# Patient Record
Sex: Female | Born: 1965
Health system: Southern US, Community
[De-identification: ages and names within clinical notes are randomized; demographics above are authoritative.]

## PROBLEM LIST (undated history)

## (undated) DIAGNOSIS — I10 Essential (primary) hypertension: Secondary | ICD-10-CM

## (undated) DIAGNOSIS — E538 Deficiency of other specified B group vitamins: Secondary | ICD-10-CM

## (undated) DIAGNOSIS — M545 Low back pain, unspecified: Secondary | ICD-10-CM

## (undated) DIAGNOSIS — D219 Benign neoplasm of connective and other soft tissue, unspecified: Secondary | ICD-10-CM

## (undated) DIAGNOSIS — T7840XA Allergy, unspecified, initial encounter: Secondary | ICD-10-CM

## (undated) DIAGNOSIS — M1990 Unspecified osteoarthritis, unspecified site: Secondary | ICD-10-CM

## (undated) DIAGNOSIS — G5600 Carpal tunnel syndrome, unspecified upper limb: Secondary | ICD-10-CM

## (undated) DIAGNOSIS — M2550 Pain in unspecified joint: Secondary | ICD-10-CM

## (undated) DIAGNOSIS — Z86010 Personal history of colonic polyps: Principal | ICD-10-CM

## (undated) HISTORY — DX: Unspecified osteoarthritis, unspecified site: M19.90

## (undated) HISTORY — PX: BACK SURGERY: SHX140

## (undated) HISTORY — DX: Essential (primary) hypertension: I10

## (undated) HISTORY — PX: COLONOSCOPY: SHX174

## (undated) HISTORY — DX: Benign neoplasm of connective and other soft tissue, unspecified: D21.9

## (undated) HISTORY — DX: Low back pain: M54.5

## (undated) HISTORY — DX: Deficiency of other specified B group vitamins: E53.8

## (undated) HISTORY — DX: Allergy, unspecified, initial encounter: T78.40XA

## (undated) HISTORY — DX: Carpal tunnel syndrome, unspecified upper limb: G56.00

## (undated) HISTORY — PX: POLYPECTOMY: SHX149

## (undated) HISTORY — PX: OTHER SURGICAL HISTORY: SHX169

## (undated) HISTORY — DX: Low back pain, unspecified: M54.50

## (undated) HISTORY — DX: Pain in unspecified joint: M25.50

## (undated) HISTORY — DX: Personal history of colonic polyps: Z86.010

---

## 1998-12-30 ENCOUNTER — Other Ambulatory Visit: Admission: RE | Admit: 1998-12-30 | Discharge: 1998-12-30 | Payer: Self-pay | Admitting: Obstetrics and Gynecology

## 1999-09-17 ENCOUNTER — Emergency Department (HOSPITAL_COMMUNITY): Admission: EM | Admit: 1999-09-17 | Discharge: 1999-09-17 | Payer: Self-pay | Admitting: Emergency Medicine

## 1999-10-08 ENCOUNTER — Other Ambulatory Visit: Admission: RE | Admit: 1999-10-08 | Discharge: 1999-10-08 | Payer: Self-pay | Admitting: Internal Medicine

## 2001-09-04 ENCOUNTER — Emergency Department (HOSPITAL_COMMUNITY): Admission: EM | Admit: 2001-09-04 | Discharge: 2001-09-04 | Payer: Self-pay | Admitting: Emergency Medicine

## 2001-09-13 ENCOUNTER — Emergency Department (HOSPITAL_COMMUNITY): Admission: EM | Admit: 2001-09-13 | Discharge: 2001-09-13 | Payer: Self-pay

## 2002-01-11 ENCOUNTER — Other Ambulatory Visit: Admission: RE | Admit: 2002-01-11 | Discharge: 2002-01-11 | Payer: Self-pay | Admitting: Internal Medicine

## 2002-09-09 ENCOUNTER — Emergency Department (HOSPITAL_COMMUNITY): Admission: EM | Admit: 2002-09-09 | Discharge: 2002-09-09 | Payer: Self-pay | Admitting: Emergency Medicine

## 2003-03-26 ENCOUNTER — Other Ambulatory Visit: Admission: RE | Admit: 2003-03-26 | Discharge: 2003-03-26 | Payer: Self-pay | Admitting: Internal Medicine

## 2004-03-08 ENCOUNTER — Ambulatory Visit: Payer: Self-pay | Admitting: Family Medicine

## 2004-03-18 ENCOUNTER — Ambulatory Visit: Payer: Self-pay | Admitting: Family Medicine

## 2004-03-18 ENCOUNTER — Ambulatory Visit: Payer: Self-pay | Admitting: *Deleted

## 2004-04-08 ENCOUNTER — Ambulatory Visit: Payer: Self-pay | Admitting: Family Medicine

## 2004-05-06 ENCOUNTER — Ambulatory Visit: Payer: Self-pay | Admitting: Family Medicine

## 2004-05-13 ENCOUNTER — Ambulatory Visit: Payer: Self-pay | Admitting: Family Medicine

## 2004-05-20 ENCOUNTER — Ambulatory Visit: Payer: Self-pay | Admitting: Family Medicine

## 2004-07-05 ENCOUNTER — Ambulatory Visit: Payer: Self-pay | Admitting: Internal Medicine

## 2004-07-08 ENCOUNTER — Ambulatory Visit: Payer: Self-pay | Admitting: Family Medicine

## 2004-09-30 ENCOUNTER — Ambulatory Visit: Payer: Self-pay | Admitting: Family Medicine

## 2005-04-11 ENCOUNTER — Ambulatory Visit: Payer: Self-pay | Admitting: Hospitalist

## 2005-04-19 ENCOUNTER — Ambulatory Visit: Payer: Self-pay | Admitting: Hospitalist

## 2005-09-05 ENCOUNTER — Ambulatory Visit: Payer: Self-pay | Admitting: Internal Medicine

## 2005-09-13 ENCOUNTER — Encounter: Payer: Self-pay | Admitting: Internal Medicine

## 2005-09-13 ENCOUNTER — Ambulatory Visit: Payer: Self-pay | Admitting: Internal Medicine

## 2005-09-13 ENCOUNTER — Other Ambulatory Visit: Admission: RE | Admit: 2005-09-13 | Discharge: 2005-09-13 | Payer: Self-pay | Admitting: Internal Medicine

## 2005-10-11 ENCOUNTER — Ambulatory Visit: Payer: Self-pay | Admitting: Internal Medicine

## 2006-01-16 ENCOUNTER — Ambulatory Visit: Payer: Self-pay | Admitting: Internal Medicine

## 2006-03-03 ENCOUNTER — Ambulatory Visit: Payer: Self-pay | Admitting: Internal Medicine

## 2006-03-07 ENCOUNTER — Ambulatory Visit: Payer: Self-pay | Admitting: Internal Medicine

## 2006-05-29 ENCOUNTER — Telehealth: Payer: Self-pay | Admitting: *Deleted

## 2006-07-03 ENCOUNTER — Ambulatory Visit: Payer: Self-pay | Admitting: Internal Medicine

## 2006-07-03 LAB — CONVERTED CEMR LAB
CO2: 32 meq/L (ref 19–32)
Chloride: 103 meq/L (ref 96–112)
GFR calc Af Amer: 176 mL/min
Glucose, Bld: 124 mg/dL — ABNORMAL HIGH (ref 70–99)
HDL: 45.2 mg/dL (ref >39.0)
Hgb A1c MFr Bld: 7.3 % — ABNORMAL HIGH (ref 4.6–6.0)
Potassium: 4.7 meq/L (ref 3.5–5.1)
Sodium: 142 meq/L (ref 135–145)
Total Bilirubin: 0.4 mg/dL (ref 0.3–1.2)
Triglycerides: 170 mg/dL — ABNORMAL HIGH (ref 0–149)
VLDL: 34 mg/dL (ref 0–40)

## 2006-07-04 ENCOUNTER — Other Ambulatory Visit: Admission: RE | Admit: 2006-07-04 | Discharge: 2006-07-04 | Payer: Self-pay | Admitting: Internal Medicine

## 2006-07-04 ENCOUNTER — Encounter: Payer: Self-pay | Admitting: Internal Medicine

## 2006-07-04 ENCOUNTER — Ambulatory Visit: Payer: Self-pay | Admitting: Internal Medicine

## 2006-07-04 LAB — CONVERTED CEMR LAB

## 2006-11-06 ENCOUNTER — Ambulatory Visit: Payer: Self-pay | Admitting: Internal Medicine

## 2006-11-06 LAB — CONVERTED CEMR LAB
BUN: 10 mg/dL (ref 6–23)
Chloride: 101 meq/L (ref 96–112)
Creatinine, Ser: 0.7 mg/dL (ref 0.4–1.2)
Glucose, Bld: 213 mg/dL — ABNORMAL HIGH (ref 70–99)
Hgb A1c MFr Bld: 7 % — ABNORMAL HIGH (ref 4.6–6.0)
Sodium: 140 meq/L (ref 135–145)
Vit D, 1,25-Dihydroxy: 49 (ref 20–57)

## 2007-03-08 ENCOUNTER — Ambulatory Visit: Payer: Self-pay | Admitting: Internal Medicine

## 2007-03-08 DIAGNOSIS — M545 Low back pain, unspecified: Secondary | ICD-10-CM | POA: Insufficient documentation

## 2007-03-08 DIAGNOSIS — M1990 Unspecified osteoarthritis, unspecified site: Secondary | ICD-10-CM | POA: Insufficient documentation

## 2007-03-11 DIAGNOSIS — J309 Allergic rhinitis, unspecified: Secondary | ICD-10-CM | POA: Insufficient documentation

## 2007-03-11 DIAGNOSIS — J45909 Unspecified asthma, uncomplicated: Secondary | ICD-10-CM | POA: Insufficient documentation

## 2007-03-11 DIAGNOSIS — I10 Essential (primary) hypertension: Secondary | ICD-10-CM | POA: Insufficient documentation

## 2007-07-06 ENCOUNTER — Ambulatory Visit: Payer: Self-pay | Admitting: Internal Medicine

## 2007-07-06 DIAGNOSIS — E876 Hypokalemia: Secondary | ICD-10-CM | POA: Insufficient documentation

## 2007-07-09 LAB — CONVERTED CEMR LAB
CO2: 28 meq/L (ref 19–32)
Calcium: 8.9 mg/dL (ref 8.4–10.5)
Chloride: 100 meq/L (ref 96–112)
Creatinine, Ser: 0.7 mg/dL (ref 0.4–1.2)
GFR calc Af Amer: 119 mL/min
HCT: 37.1 % (ref 36.0–46.0)
Ketones, ur: NEGATIVE mg/dL
Neutrophils Relative %: 50.2 % (ref 43.0–77.0)
Nitrite: NEGATIVE
Platelets: 350 10*3/uL (ref 150–400)
Total Protein, Urine: NEGATIVE mg/dL
Urine Glucose: NEGATIVE mg/dL
pH: 5.5 (ref 5.0–8.0)

## 2007-10-05 ENCOUNTER — Ambulatory Visit: Payer: Self-pay | Admitting: Internal Medicine

## 2007-10-05 DIAGNOSIS — J209 Acute bronchitis, unspecified: Secondary | ICD-10-CM | POA: Insufficient documentation

## 2007-11-12 ENCOUNTER — Ambulatory Visit: Payer: Self-pay | Admitting: Internal Medicine

## 2007-11-12 DIAGNOSIS — R10812 Left upper quadrant abdominal tenderness: Secondary | ICD-10-CM | POA: Insufficient documentation

## 2007-11-13 LAB — CONVERTED CEMR LAB
ALT: 16 units/L (ref 0–35)
AST: 22 units/L (ref 0–37)
Alkaline Phosphatase: 69 units/L (ref 39–117)
BUN: 8 mg/dL (ref 6–23)
Bilirubin, Direct: 0.1 mg/dL (ref 0.0–0.3)
CO2: 31 meq/L (ref 19–32)
Chloride: 107 meq/L (ref 96–112)
Cholesterol: 217 mg/dL (ref 0–200)
Direct LDL: 127 mg/dL
GFR calc non Af Amer: 98 mL/min
Potassium: 4 meq/L (ref 3.5–5.1)
Sodium: 142 meq/L (ref 135–145)
Total Bilirubin: 0.5 mg/dL (ref 0.3–1.2)
Total CHOL/HDL Ratio: 4.5
Triglycerides: 96 mg/dL (ref 0–149)
Vitamin B-12: 1152 pg/mL — ABNORMAL HIGH (ref 211–911)

## 2007-12-14 ENCOUNTER — Ambulatory Visit: Payer: Self-pay | Admitting: Internal Medicine

## 2008-03-03 ENCOUNTER — Ambulatory Visit: Payer: Self-pay | Admitting: Internal Medicine

## 2008-03-05 LAB — CONVERTED CEMR LAB
AST: 20 units/L (ref 0–37)
Basophils Relative: 0.8 % (ref 0.0–3.0)
Bilirubin Urine: NEGATIVE
Bilirubin, Direct: 0.1 mg/dL (ref 0.0–0.3)
CO2: 29 meq/L (ref 19–32)
Calcium: 9.5 mg/dL (ref 8.4–10.5)
Chloride: 99 meq/L (ref 96–112)
Cholesterol: 209 mg/dL (ref 0–200)
Creatinine, Ser: 0.7 mg/dL (ref 0.4–1.2)
GFR calc non Af Amer: 98 mL/min
Glucose, Bld: 127 mg/dL — ABNORMAL HIGH (ref 70–99)
HCT: 39.4 % (ref 36.0–46.0)
Hemoglobin: 13.5 g/dL (ref 12.0–15.0)
Leukocytes, UA: NEGATIVE
MCHC: 34.2 g/dL (ref 30.0–36.0)
MCV: 96.9 fL (ref 78.0–100.0)
Monocytes Relative: 8.8 % (ref 3.0–12.0)
Neutro Abs: 3.7 10*3/uL (ref 1.4–7.7)
Nitrite: NEGATIVE
Platelets: 289 10*3/uL (ref 150–400)
RBC: 4.07 M/uL (ref 3.87–5.11)
RDW: 12.1 % (ref 11.5–14.6)
Sodium: 138 meq/L (ref 135–145)
Total Bilirubin: 0.5 mg/dL (ref 0.3–1.2)
Total Protein: 7.7 g/dL (ref 6.0–8.3)
Triglycerides: 179 mg/dL — ABNORMAL HIGH (ref 0–149)
VLDL: 36 mg/dL (ref 0–40)
Vitamin B-12: 1139 pg/mL — ABNORMAL HIGH (ref 211–911)
WBC: 8.3 10*3/uL (ref 4.5–10.5)

## 2008-03-18 ENCOUNTER — Ambulatory Visit: Payer: Self-pay | Admitting: Internal Medicine

## 2008-03-18 DIAGNOSIS — J069 Acute upper respiratory infection, unspecified: Secondary | ICD-10-CM | POA: Insufficient documentation

## 2008-03-24 ENCOUNTER — Encounter: Payer: Self-pay | Admitting: Internal Medicine

## 2008-06-30 ENCOUNTER — Ambulatory Visit: Payer: Self-pay | Admitting: Internal Medicine

## 2008-07-01 ENCOUNTER — Encounter: Payer: Self-pay | Admitting: Internal Medicine

## 2008-07-01 LAB — CONVERTED CEMR LAB
ALT: 21 units/L (ref 0–35)
AST: 21 units/L (ref 0–37)
Alkaline Phosphatase: 63 units/L (ref 39–117)
BUN: 10 mg/dL (ref 6–23)
Chloride: 106 meq/L (ref 96–112)
Creatinine, Ser: 0.6 mg/dL (ref 0.4–1.2)
GFR calc non Af Amer: 140.82 mL/min (ref >60)
Glucose, Bld: 167 mg/dL — ABNORMAL HIGH (ref 70–99)
Potassium: 4.4 meq/L (ref 3.5–5.1)
TSH: 3.94 microintl units/mL (ref 0.35–5.50)
Total Protein: 6.8 g/dL (ref 6.0–8.3)

## 2008-09-25 ENCOUNTER — Encounter: Payer: Self-pay | Admitting: Internal Medicine

## 2008-09-25 ENCOUNTER — Ambulatory Visit: Payer: Self-pay | Admitting: Internal Medicine

## 2008-09-25 DIAGNOSIS — M5416 Radiculopathy, lumbar region: Secondary | ICD-10-CM | POA: Insufficient documentation

## 2008-09-25 DIAGNOSIS — IMO0002 Reserved for concepts with insufficient information to code with codable children: Secondary | ICD-10-CM | POA: Insufficient documentation

## 2008-10-03 ENCOUNTER — Ambulatory Visit: Payer: Self-pay | Admitting: Internal Medicine

## 2008-10-15 ENCOUNTER — Telehealth: Payer: Self-pay | Admitting: Internal Medicine

## 2008-10-28 ENCOUNTER — Ambulatory Visit: Payer: Self-pay | Admitting: Internal Medicine

## 2008-11-19 ENCOUNTER — Ambulatory Visit: Payer: Self-pay | Admitting: Internal Medicine

## 2008-11-19 LAB — CONVERTED CEMR LAB: Blood Glucose, Fingerstick: 152

## 2008-12-22 ENCOUNTER — Ambulatory Visit: Payer: Self-pay | Admitting: Internal Medicine

## 2009-01-01 ENCOUNTER — Encounter: Admission: RE | Admit: 2009-01-01 | Discharge: 2009-01-01 | Payer: Self-pay | Admitting: Internal Medicine

## 2009-01-23 ENCOUNTER — Ambulatory Visit: Payer: Self-pay | Admitting: Internal Medicine

## 2009-01-29 ENCOUNTER — Encounter: Payer: Self-pay | Admitting: Internal Medicine

## 2009-04-03 ENCOUNTER — Ambulatory Visit: Payer: Self-pay | Admitting: Internal Medicine

## 2009-04-03 LAB — CONVERTED CEMR LAB
AST: 17 units/L (ref 0–37)
Albumin: 4.5 g/dL (ref 3.5–5.2)
Alkaline Phosphatase: 65 units/L (ref 39–117)
BUN: 11 mg/dL (ref 6–23)
Calcium: 9.5 mg/dL (ref 8.4–10.5)
Chloride: 99 meq/L (ref 96–112)
Creatinine, Ser: 0.6 mg/dL (ref 0.40–1.20)
Total Bilirubin: 0.3 mg/dL (ref 0.3–1.2)
Total Protein: 7.9 g/dL (ref 6.0–8.3)

## 2009-04-06 LAB — CONVERTED CEMR LAB: Hgb A1c MFr Bld: 6.9 % — ABNORMAL HIGH (ref 4.6–6.5)

## 2009-05-28 ENCOUNTER — Ambulatory Visit: Payer: Self-pay | Admitting: Internal Medicine

## 2009-05-28 DIAGNOSIS — R079 Chest pain, unspecified: Secondary | ICD-10-CM | POA: Insufficient documentation

## 2009-08-06 ENCOUNTER — Ambulatory Visit: Payer: Self-pay | Admitting: Internal Medicine

## 2009-08-06 LAB — CONVERTED CEMR LAB
Calcium: 9.5 mg/dL (ref 8.4–10.5)
Chloride: 101 meq/L (ref 96–112)
Cholesterol: 199 mg/dL (ref 0–200)
HDL: 52.7 mg/dL (ref >39.00)
Triglycerides: 93 mg/dL (ref 0.0–149.0)
VLDL: 18.6 mg/dL (ref 0.0–40.0)
Vitamin B-12: 1104 pg/mL — ABNORMAL HIGH (ref 211–911)

## 2009-08-07 ENCOUNTER — Ambulatory Visit: Payer: Self-pay | Admitting: Internal Medicine

## 2009-12-07 ENCOUNTER — Ambulatory Visit: Payer: Self-pay | Admitting: Internal Medicine

## 2009-12-09 LAB — CONVERTED CEMR LAB
AST: 22 units/L (ref 0–37)
Alkaline Phosphatase: 62 units/L (ref 39–117)
Calcium: 9.5 mg/dL (ref 8.4–10.5)
Creatinine, Ser: 0.7 mg/dL (ref 0.4–1.2)
Hgb A1c MFr Bld: 7.9 % — ABNORMAL HIGH (ref 4.6–6.5)
Microalb Creat Ratio: 0.9 mg/g (ref 0.0–30.0)
Potassium: 4.4 meq/L (ref 3.5–5.1)
Sodium: 139 meq/L (ref 135–145)
Vitamin B-12: >1500 pg/mL — ABNORMAL HIGH (ref 211–911)

## 2009-12-10 ENCOUNTER — Telehealth: Payer: Self-pay | Admitting: Internal Medicine

## 2010-01-15 ENCOUNTER — Emergency Department (HOSPITAL_COMMUNITY): Admission: EM | Admit: 2010-01-15 | Discharge: 2010-01-15 | Payer: Self-pay | Admitting: Emergency Medicine

## 2010-03-01 ENCOUNTER — Ambulatory Visit: Payer: Self-pay | Admitting: Internal Medicine

## 2010-03-08 ENCOUNTER — Ambulatory Visit: Payer: Self-pay | Admitting: Internal Medicine

## 2010-03-10 LAB — CONVERTED CEMR LAB
BUN: 10 mg/dL (ref 6–23)
Calcium: 10.2 mg/dL (ref 8.4–10.5)
Chloride: 100 meq/L (ref 96–112)
Creatinine, Ser: 0.5 mg/dL (ref 0.4–1.2)
GFR calc non Af Amer: 164.79 mL/min (ref >60.00)
Hgb A1c MFr Bld: 7.8 % — ABNORMAL HIGH (ref 4.6–6.5)
Vitamin B-12: 1170 pg/mL — ABNORMAL HIGH (ref 211–911)

## 2010-04-03 DIAGNOSIS — H409 Unspecified glaucoma: Secondary | ICD-10-CM | POA: Insufficient documentation

## 2010-04-03 DIAGNOSIS — G5600 Carpal tunnel syndrome, unspecified upper limb: Secondary | ICD-10-CM | POA: Insufficient documentation

## 2010-04-03 DIAGNOSIS — M2550 Pain in unspecified joint: Secondary | ICD-10-CM | POA: Insufficient documentation

## 2010-04-03 DIAGNOSIS — E538 Deficiency of other specified B group vitamins: Secondary | ICD-10-CM | POA: Insufficient documentation

## 2010-04-12 ENCOUNTER — Encounter: Payer: Self-pay | Admitting: Internal Medicine

## 2010-04-20 NOTE — Assessment & Plan Note (Signed)
Summary: 4 MTH FU  STC   Vital Signs:  Patient profile:   45 year old female Height:      69 inches Weight:      174.50 pounds BMI:     25.86 O2 Sat:      99 % on Room air Temp:     97.5 degrees F oral Pulse rate:   85 / minute BP sitting:   130 / 80  (left arm) Cuff size:   regular  Vitals Entered By: Lucious Groves (Aug 07, 2009 8:09 AM)  O2 Flow:  Room air CC: 4 mo f/u./kb Is Patient Diabetic? Yes Pain Assessment Patient in pain? no      Comments Patient notes that she is not taking Lamisil anymore./kb   Primary Care Provider:  Tresa Garter MD  CC:  4 mo f/u./kb.  History of Present Illness: The patient presents for a follow up of hypertension, diabetes, hyperlipidemia   Current Medications (verified): 1)  Glimepiride 1 Mg  Tabs (Glimepiride) .... 1/2 Once Daily 2)  Lamisil At Athletes Foot 1 %  Crea (Terbinafine Hcl) .... As Needed 3)  Vitamin D3 1000 Unit  Tabs (Cholecalciferol) .Marland Kitchen.. 1 By Mouth Daily 4)  Cvs Vitamin B12 1000 Mcg  Tabs (Cyanocobalamin) .... Once Daily 5)  Loratadine 10 Mg  Tabs (Loratadine) .... Once Daily As Needed Allergies 6)  Hydrochlorothiazide 12.5 Mg  Tabs (Hydrochlorothiazide) .... Take 1 Tab  By Mouth Every Morning 7)  Advair Diskus 250-50 Mcg/dose Misc (Fluticasone-Salmeterol) .Marland Kitchen.. 1 Puff 2 Times Daily 8)  Aviane 0.1-20 Mg-Mcg Tabs (Levonorgestrel-Ethinyl Estrad) .... As Dirr. 9)  Ventolin Hfa 108 (90 Base) Mcg/act Aers (Albuterol Sulfate) .... As Directed As Needed 10)  Fluticasone Propionate 50 Mcg/act  Susp (Fluticasone Propionate) .... 2 Sprays Each Nostril Once Daily 11)  Cyclobenzaprine Hcl 10 Mg  Tabs (Cyclobenzaprine Hcl) .Marland Kitchen.. 1 By Mouth 2 Times Daily As Needed For Back Pain 12)  Ibuprofen 600 Mg  Tabs (Ibuprofen) .Marland Kitchen.. 1 By Mouth Two Times A Day X10 D, Then As Needed 13)  Ranitidine Hcl 300 Mg Tabs (Ranitidine Hcl) .Marland Kitchen.. 1 By Mouth Once Daily For Indigestion 14)  Metformin Hcl 1000 Mg Tabs (Metformin Hcl) .... Take 1 Two  Times A Day  Allergies (verified): 1)  ! Ultram (Tramadol Hcl) 2)  Lotensin 3)  * Cleocin  Vag  Past History:  Past Medical History: Last updated: 07/06/2007 Diabetes mellitus, type II Low back pain Osteoarthritis CTS ? glaucoma Polyarthralgia   Dr Kellie Simmering Asthma Hypertension Vit B12 def Allergic rhinitis  Social History: Last updated: 03/08/2007 Occupation: plant worker Single Never Smoked  Review of Systems  The patient denies fever, weight loss, weight gain, prolonged cough, and abdominal pain.    Physical Exam  General:  alert and overweight-appearing (mild) Nose:  Erythematous throat mucosa and intranasal erythema.  Mouth:  no gingival abnormalities and pharynx pink and moist.   Lungs:  CTA Heart:  RRR Abdomen:  soft and non-tender.   Msk:  Lumbar-sacral spine is tender to palpation over paraspinal muscles and painfull with the ROM  Extremities:  no edema, no ulcers  Neurologic:  alert , cranial nerves II-XII intact, strength normal in all extremities Skin:  Intact without suspicious lesions or rashes Psych:  Cognition and judgment appear intact. Alert and cooperative with normal attention span and concentration. No apparent delusions, illusions, hallucinations   Impression & Recommendations:  Problem # 1:  DIABETES MELLITUS, TYPE II (ICD-250.00) Assessment Improved  Her updated  medication list for this problem includes:    Glimepiride 1 Mg Tabs (Glimepiride) .Marland Kitchen... 1/2 once daily    Metformin Hcl 1000 Mg Tabs (Metformin hcl) .Marland Kitchen... Take 1 two times a day  Problem # 2:  B12 DEFICIENCY (ICD-266.2) Assessment: Improved  Problem # 3:  LUMBAR RADICULOPATHY, RIGHT (ICD-724.4) Assessment: Improved  Her updated medication list for this problem includes:    Cyclobenzaprine Hcl 10 Mg Tabs (Cyclobenzaprine hcl) .Marland Kitchen... 1 by mouth 2 times daily as needed for back pain    Ibuprofen 600 Mg Tabs (Ibuprofen) .Marland Kitchen... 1 by mouth two times a day x10 d, then as  needed  Problem # 4:  ASTHMA (ICD-493.90) Assessment: Unchanged  Her updated medication list for this problem includes:    Advair Diskus 250-50 Mcg/dose Misc (Fluticasone-salmeterol) .Marland Kitchen... 1 puff 2 times daily    Ventolin Hfa 108 (90 Base) Mcg/act Aers (Albuterol sulfate) .Marland Kitchen... As directed as needed  Problem # 5:  ALLERGIC RHINITIS (ICD-477.9)  Her updated medication list for this problem includes:    Loratadine 10 Mg Tabs (Loratadine) ..... Once daily as needed allergies    Fluticasone Propionate 50 Mcg/act Susp (Fluticasone propionate) .Marland Kitchen... 2 sprays each nostril once daily  Complete Medication List: 1)  Glimepiride 1 Mg Tabs (Glimepiride) .... 1/2 once daily 2)  Lamisil At Athletes Foot 1 % Crea (Terbinafine hcl) .... As needed 3)  Vitamin D3 1000 Unit Tabs (Cholecalciferol) .Marland Kitchen.. 1 by mouth daily 4)  Cvs Vitamin B12 1000 Mcg Tabs (Cyanocobalamin) .... Once daily 5)  Loratadine 10 Mg Tabs (Loratadine) .... Once daily as needed allergies 6)  Hydrochlorothiazide 12.5 Mg Tabs (Hydrochlorothiazide) .... Take 1 tab  by mouth every morning 7)  Advair Diskus 250-50 Mcg/dose Misc (Fluticasone-salmeterol) .Marland Kitchen.. 1 puff 2 times daily 8)  Aviane 0.1-20 Mg-mcg Tabs (Levonorgestrel-ethinyl estrad) .... As dirr. 9)  Ventolin Hfa 108 (90 Base) Mcg/act Aers (Albuterol sulfate) .... As directed as needed 10)  Fluticasone Propionate 50 Mcg/act Susp (Fluticasone propionate) .... 2 sprays each nostril once daily 11)  Cyclobenzaprine Hcl 10 Mg Tabs (Cyclobenzaprine hcl) .Marland Kitchen.. 1 by mouth 2 times daily as needed for back pain 12)  Ibuprofen 600 Mg Tabs (Ibuprofen) .Marland Kitchen.. 1 by mouth two times a day x10 d, then as needed 13)  Ranitidine Hcl 300 Mg Tabs (Ranitidine hcl) .Marland Kitchen.. 1 by mouth once daily for indigestion 14)  Metformin Hcl 1000 Mg Tabs (Metformin hcl) .... Take 1 two times a day  Patient Instructions: 1)  Please schedule a follow-up appointment in 4 months. 2)  BMP prior to visit, ICD-9: 3)  HbgA1C  prior to visit, ICD-9:250.00 Prescriptions: METFORMIN HCL 1000 MG TABS (METFORMIN HCL) take 1 two times a day  #60 x 3   Entered and Authorized by:   Tresa Garter MD   Signed by:   Tresa Garter MD on 08/07/2009   Method used:   Electronically to        [facility] (retail)       [address]       [place], [place]  [address]       Ph: [phone]       Fax: [phone]   RxID:   [id number] RANITIDINE HCL 300 MG TABS (RANITIDINE HCL) 1 by mouth once daily for indigestion  #30 x 3   Entered and Authorized by:   Tresa Garter MD   Signed by:   Georgina Quint Thirza Pellicano  MD on 08/07/2009   Method used:   [facility]ail[facility]       [address]       [place], [place]  [address]       Ph: [phone]       Fax: [phone]   RxID:   [id number] IBUPROFEN 600 MG  TABS (IBUPROFEN) 1 by mouth two times a day x10 d, then as needed  #60 x 3   Entered and Authorized by:   Tresa Garter MD   Signed by:   Tresa Garter MD on 08/07/2009   Method used:   Electronically to        [facility] (retail)       [address]       [place], [place]  [address]       Ph: [phone]       Fax: [phone]   RxID:   [id number] CYCLOBENZAPRINE HCL 10 MG  TABS (CYCLOBENZAPRINE HCL) 1 by mouth 2 times daily as needed for back pain  #30 x 1   Entered and Authorized by:   Tresa Garter MD   Signed by:   Tresa Garter MD on 08/07/2009   Method used:   Electronically to        [facility] (retail)       [address]       [place], [place]  [address]       Ph: [phone]       Fax: [phone]   RxID:   [id number] VENTOLIN HFA 108 (90 BASE) MCG/ACT AERS (ALBUTEROL SULFATE) as directed as needed  #1 x 12   Entered and Authorized by:   Tresa Garter MD   Signed by:   Tresa Garter MD on  08/07/2009   Method used:   Electronically to        [facility] (retail)       [address]       [place], [place]  [address]       Ph: [phone]       Fax: [phone]   RxID:   [id number] AVIANE 0.1-20 MG-MCG TABS (LEVONORGESTREL-ETHINYL ESTRAD) as dirr.  #28 Each x 11   Entered and Authorized by:   Tresa Garter MD   Signed by:   Tresa Garter MD on 08/07/2009   Method used:   Electronically to        [facility] (retail)       [address]       [place], [place]  [address]       Ph: [phone]       Fax: [phone]   RxID:   [id number] ADVAIR DISKUS 250-50 MCG/DOSE MISC (FLUTICASONE-SALMETEROL) 1 puff 2 times daily  #1 x 12   Entered and Authorized by:   Tresa Garter MD   Signed by:   Tresa Garter MD on 08/07/2009   Method used:   Electronically to        [facility] (retail)       [address]  [place], [place]  [address]       Ph: [phone]       Fax: [phone]   RxID:   [id number] HYDROCHLOROTHIAZIDE 12.5 MG  TABS (HYDROCHLOROTHIAZIDE) Take 1 tab  by mouth every morning  #30 x 11   Entered and Authorized by:   Tresa Garter MD   Signed by:   Tresa Garter MD on 08/07/2009   Method used:   Electronically to        [facility] (retail)       [address]       [place], [place]  [address]       Ph: [phone]       Fax: [phone]   RxID:   [id number] LORATADINE 10 MG  TABS (LORATADINE) once daily as needed allergies  #30 x 12   Entered and Authorized by:   Tresa Garter MD   Signed by:   Tresa Garter MD on 08/07/2009   Method used:   Electronically to        [facility] (retail)       [address]       [place], [place]  [address]       Ph: [phone]       Fax: [phone]   RxID:   [id number] [facility] VITAMIN B12  1000 MCG  TABS (CYANOCOBALAMIN) once daily  #100 x 3   Entered and Authorized by:   Tresa Garter MD   Signed by:   Tresa Garter MD on 08/07/2009   Method used:   Electronically to        [facility] (retail)       [address]       [place], [place]  [address]       Ph: [phone]       Fax: [phone]   RxID:   [id number] VITAMIN D3 1000 UNIT  TABS (CHOLECALCIFEROL) 1 by mouth daily  #100 x 3   Entered and Authorized by:   Tresa Garter MD   Signed by:   Tresa Garter MD on 08/07/2009   Method used:   Electronically to        [facility] (retail)       [address]       [place], [place]  [address]       Ph: [phone]       Fax: [phone]   RxID:   [id number] GLIMEPIRIDE 1 MG  TABS (GLIMEPIRIDE) 1/2 once daily  #30 Each x 11   Entered and Authorized by:   Tresa Garter MD   Signed by:   Tresa Garter MD on 08/07/2009   Method used:   Electronically to        [facility] (retail)       [address]       [place], [place]  [address]       Ph: [phone]       Fax: [phone]   RxID:   [id number]

## 2010-04-20 NOTE — Assessment & Plan Note (Signed)
Summary: asthma flare up/cd   Vital Signs:  Patient profile:   45 year old female Weight:      175 pounds Temp:     96.9 degrees F oral Pulse rate:   94 / minute BP sitting:   120 / 84  (left arm)  Vitals Entered By: Tora Perches (May 28, 2009 8:59 AM) Is Patient Diabetic? Yes   Primary Care Provider:  Georgina Quint Plotnikov MD   History of Present Illness: C/o CP in the middle - constant; dull and constant; has some L arm and back discomfort.  Preventive Screening-Counseling & Management  Alcohol-Tobacco     Smoking Status: never  Current Medications (verified): 1)  Metformin Hcl 500 Mg  Tabs (Metformin Hcl) .... 2 Tabs Two Times A Day 2)  Glimepiride 1 Mg  Tabs (Glimepiride) .... 1/2 Once Daily 3)  Lamisil At Athletes Foot 1 %  Crea (Terbinafine Hcl) .... As Needed 4)  Vitamin D3 1000 Unit  Tabs (Cholecalciferol) .Marland Kitchen.. 1 By Mouth Daily 5)  Cvs Vitamin B12 1000 Mcg  Tabs (Cyanocobalamin) .... Once Daily 6)  Loratadine 10 Mg  Tabs (Loratadine) .... Once Daily As Needed Allergies 7)  Hydrochlorothiazide 12.5 Mg  Tabs (Hydrochlorothiazide) .... Take 1 Tab  By Mouth Every Morning 8)  Advair Diskus 250-50 Mcg/dose Misc (Fluticasone-Salmeterol) .Marland Kitchen.. 1 Puff 2 Times Daily 9)  Aviane 0.1-20 Mg-Mcg Tabs (Levonorgestrel-Ethinyl Estrad) .... As Dirr. 10)  Ventolin Hfa 108 (90 Base) Mcg/act Aers (Albuterol Sulfate) .... As Directed As Needed 11)  Fluticasone Propionate 50 Mcg/act  Susp (Fluticasone Propionate) .... 2 Sprays Each Nostril Once Daily 12)  Cyclobenzaprine Hcl 10 Mg  Tabs (Cyclobenzaprine Hcl) .Marland Kitchen.. 1 By Mouth 2 Times Daily As Needed For Back Pain 13)  Ibuprofen 600 Mg  Tabs (Ibuprofen) .Marland Kitchen.. 1 By Mouth Two Times A Day Prn  Allergies: 1)  ! Ultram (Tramadol Hcl) 2)  Lotensin 3)  * Cleocin  Vag  Past History:  Past Medical History: Last updated: 07/06/2007 Diabetes mellitus, type II Low back pain Osteoarthritis CTS ? 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  Mouth:  no gingival abnormalities and pharynx pink and moist.   Lungs:  CTA Heart:  RRR Abdomen:  soft and non-tender.   Msk:  Lumbar-sacral spine is tender to palpation over paraspinal muscles and painfull with the ROM  Neurologic:  alert , cranial nerves II-XII intact, strength normal in all extremities   Impression & Recommendations:  Problem # 1:  CHEST PAIN (ICD-786.50) likely MSK vs bronchitis/asthma Assessment New Ibuprofen Ranitidine Orders: EKG w/ Interpretation (93000) OK T-2 View CXR, Same Day (71020.5TC) Keep return office visit   Problem # 2:  BRONCHITIS, ACUTE (ICD-466.0) Assessment: New  Her updated medication list for this problem includes:    Advair Diskus 250-50 Mcg/dose Misc (Fluticasone-salmeterol) .Marland Kitchen... 1 puff 2 times daily    Ventolin Hfa 108 (90 Base) Mcg/act Aers (Albuterol sulfate) .Marland Kitchen... As directed as needed  Problem # 3:  HYPERTENSION (ICD-401.9) Assessment: Unchanged  Her updated medication list for this problem includes:    Hydrochlorothiazide 12.5 Mg Tabs (Hydrochlorothiazide) .Marland Kitchen... Take 1 tab  by mouth every morning  Problem # 4:  B12 DEFICIENCY (ICD-266.2) Assessment: Unchanged  Complete Medication List: 1)  Metformin Hcl 500 Mg Tabs (Metformin hcl) .Marland KitchenMarland KitchenMarland Kitchen  2 tabs two times a day 2)  Glimepiride 1 Mg Tabs (Glimepiride) .... 1/2 once daily 3)  Lamisil At Athletes Foot 1 % Crea (Terbinafine hcl) .... As needed 4)  Vitamin D3 1000 Unit Tabs (Cholecalciferol) .Marland Kitchen.. 1 by mouth daily 5)  Cvs Vitamin B12 1000 Mcg Tabs (Cyanocobalamin) .... Once daily 6)  Loratadine 10 Mg Tabs (Loratadine) .... Once daily as needed allergies 7)  Hydrochlorothiazide 12.5 Mg Tabs  (Hydrochlorothiazide) .... Take 1 tab  by mouth every morning 8)  Advair Diskus 250-50 Mcg/dose Misc (Fluticasone-salmeterol) .Marland Kitchen.. 1 puff 2 times daily 9)  Aviane 0.1-20 Mg-mcg Tabs (Levonorgestrel-ethinyl estrad) .... As dirr. 10)  Ventolin Hfa 108 (90 Base) Mcg/act Aers (Albuterol sulfate) .... As directed as needed 11)  Fluticasone Propionate 50 Mcg/act Susp (Fluticasone propionate) .... 2 sprays each nostril once daily 12)  Cyclobenzaprine Hcl 10 Mg Tabs (Cyclobenzaprine hcl) .Marland Kitchen.. 1 by mouth 2 times daily as needed for back pain 13)  Ibuprofen 600 Mg Tabs (Ibuprofen) .Marland Kitchen.. 1 by mouth two times a day x10 d, then as needed 14)  Ranitidine Hcl 300 Mg Tabs (Ranitidine hcl) .Marland Kitchen.. 1 by mouth once daily for indigestion  Patient Instructions: 1)  Keep return office visit  2)  Call if you are not better in a reasonable amount of time or if worse. Go to ER if feeling really bad!  3)  Use stretching exercises that I have provided (15 min. or longer every day) Prescriptions: RANITIDINE HCL 300 MG TABS (RANITIDINE HCL) 1 by mouth once daily for indigestion  #30 x 3   Entered and Authorized by:   Tresa Garter MD   Signed by:   Tresa Garter MD on 05/28/2009   Method used:   Print then Give to Patient   RxID:   [id number] IBUPROFEN 600 MG  TABS (IBUPROFEN) 1 by mouth two times a day x10 d, then as needed  #60 x 3   Entered and Authorized by:   Tresa Garter MD   Signed by:   Tresa Garter MD on 05/28/2009   Method used:   Print then Give to Patient   RxID:   [id number]

## 2010-04-20 NOTE — Assessment & Plan Note (Signed)
Summary: 3 MTH FU  STC   Vital Signs:  Patient profile:   45 year old female Weight:      172 pounds Temp:     97.9 degrees F oral Pulse rate:   87 / minute BP sitting:   126 / 98  (left arm)  Vitals Entered By: Tora Perches (April 03, 2009 1:48 PM) CC: f/u Is Patient Diabetic? Yes   Primary Care Provider:  Tresa Garter MD  CC:  f/u.  History of Present Illness: The patient presents for a follow up of hypertension, diabetes, hyperlipidemia, asthma   Preventive Screening-Counseling & Management  Alcohol-Tobacco     Smoking Status: never  Current Medications (verified): 1)  Metformin Hcl 500 Mg  Tabs (Metformin Hcl) .... 2 Tabs Two Times A Day 2)  Glimepiride 1 Mg  Tabs (Glimepiride) .... 1/2 Once Daily 3)  Lamisil At Athletes Foot 1 %  Crea (Terbinafine Hcl) .... As Needed 4)  Vitamin D3 1000 Unit  Tabs (Cholecalciferol) .Marland Kitchen.. 1 By Mouth Daily 5)  Cvs Vitamin B12 1000 Mcg  Tabs (Cyanocobalamin) .... Once Daily 6)  Loratadine 10 Mg  Tabs (Loratadine) .... Once Daily As Needed Allergies 7)  Hydrochlorothiazide 12.5 Mg  Tabs (Hydrochlorothiazide) .... Take 1 Tab  By Mouth Every Morning 8)  Advair Diskus 250-50 Mcg/dose Misc (Fluticasone-Salmeterol) .Marland Kitchen.. 1 Puff 2 Times Daily 9)  Aviane 0.1-20 Mg-Mcg Tabs (Levonorgestrel-Ethinyl Estrad) .... As Dirr. 10)  Ventolin Hfa 108 (90 Base) Mcg/act Aers (Albuterol Sulfate) .... As Directed As Needed 11)  Fluticasone Propionate 50 Mcg/act  Susp (Fluticasone Propionate) .... 2 Sprays Each Nostril Once Daily 12)  Cyclobenzaprine Hcl 10 Mg  Tabs (Cyclobenzaprine Hcl) .Marland Kitchen.. 1 By Mouth 2 Times Daily As Needed For Back Pain 13)  Ibuprofen 600 Mg  Tabs (Ibuprofen) .Marland Kitchen.. 1 By Mouth Two Times A Day Prn 14)  Prednisone 10 Mg  Tabs (Prednisone) .... Take 40mg  Qd For 2 Days, Then 20 Mg Qd For 2 Days, Then 10mg  Qd For 3 Days, Then Stop. Take Pc.  Allergies: 1)  ! Ultram (Tramadol Hcl) 2)  Lotensin 3)  * Cleocin  Vag  Past History:  Past  Medical History: Last updated: 07/06/2007 Diabetes mellitus, type II Low back pain Osteoarthritis CTS ? glaucoma Polyarthralgia   Dr Kellie Simmering Asthma Hypertension Vit B12 def Allergic rhinitis  Past Surgical History: Last updated: 07/06/2007 Denies surgical history  Social History: Last updated: 03/08/2007 Occupation: plant worker Single Never Smoked  Review of Systems       The patient complains of weight gain.  The patient denies fever, dyspnea on exertion, and abdominal pain.    Physical Exam  General:  alert and overweight-appearing (mild) Ears:  R ear normal and L ear normal.   Nose:  Erythematous throat mucosa and intranasal erythema.  Mouth:  no gingival abnormalities and pharynx pink and moist.   Neck:  supple and no masses.   Lungs:  CTA Heart:  RRR Abdomen:  soft and non-tender.   Msk:  Lumbar-sacral spine is tender to palpation over paraspinal muscles and painfull with the ROM  Extremities:  no edema, no ulcers  Neurologic:  alert , cranial nerves II-XII intact, strength normal in all extremities Skin:  Intact without suspicious lesions or rashes Psych:  Cognition and judgment appear intact. Alert and cooperative with normal attention span and concentration. No apparent delusions, illusions, hallucinations   Impression & Recommendations:  Problem # 1:  HYPERTENSION (ICD-401.9) Assessment Unchanged  Her updated medication  list for this problem includes:    Hydrochlorothiazide 12.5 Mg Tabs (Hydrochlorothiazide) .Marland Kitchen... Take 1 tab  by mouth every morning  Problem # 2:  B12 DEFICIENCY (ICD-266.2) Assessment: Unchanged On prescription therapy   Problem # 3:  LOW BACK PAIN (ICD-724.2) Assessment: Improved  Her updated medication list for this problem includes:    Cyclobenzaprine Hcl 10 Mg Tabs (Cyclobenzaprine hcl) .Marland Kitchen... 1 by mouth 2 times daily as needed for back pain    Ibuprofen 600 Mg Tabs (Ibuprofen) .Marland Kitchen... 1 by mouth two times a day prn  Problem #  4:  DIABETES MELLITUS, TYPE II (ICD-250.00) Assessment: Unchanged  Her updated medication list for this problem includes:    Metformin Hcl 500 Mg Tabs (Metformin hcl) .Marland Kitchen... 2 tabs two times a day    Glimepiride 1 Mg Tabs (Glimepiride) .Marland Kitchen... 1/2 once daily Labs is pending   Orders: T- * Misc. Laboratory test [phone])  Problem # 5:  ASTHMA (ICD-493.90) Assessment: Comment Only  The following medications were removed from the medication list:    Prednisone 10 Mg Tabs (Prednisone) .Marland Kitchen... Take 40mg  qd for 2 days, then 20 mg qd for 2 days, then 10mg  qd for 3 days, then stop. take pc. Her updated medication list for this problem includes:    Advair Diskus 250-50 Mcg/dose Misc (Fluticasone-salmeterol) .Marland Kitchen... 1 puff 2 times daily    Ventolin Hfa 108 (90 Base) Mcg/act Aers (Albuterol sulfate) .Marland Kitchen... As directed as needed  Complete Medication List: 1)  Metformin Hcl 500 Mg Tabs (Metformin hcl) .... 2 tabs two times a day 2)  Glimepiride 1 Mg Tabs (Glimepiride) .... 1/2 once daily 3)  Lamisil At Athletes Foot 1 % Crea (Terbinafine hcl) .... As needed 4)  Vitamin D3 1000 Unit Tabs (Cholecalciferol) .Marland Kitchen.. 1 by mouth daily 5)  Cvs Vitamin B12 1000 Mcg Tabs (Cyanocobalamin) .... Once daily 6)  Loratadine 10 Mg Tabs (Loratadine) .... Once daily as needed allergies 7)  Hydrochlorothiazide 12.5 Mg Tabs (Hydrochlorothiazide) .... Take 1 tab  by mouth every morning 8)  Advair Diskus 250-50 Mcg/dose Misc (Fluticasone-salmeterol) .Marland Kitchen.. 1 puff 2 times daily 9)  Aviane 0.1-20 Mg-mcg Tabs (Levonorgestrel-ethinyl estrad) .... As dirr. 10)  Ventolin Hfa 108 (90 Base) Mcg/act Aers (Albuterol sulfate) .... As directed as needed 11)  Fluticasone Propionate 50 Mcg/act Susp (Fluticasone propionate) .... 2 sprays each nostril once daily 12)  Cyclobenzaprine Hcl 10 Mg Tabs (Cyclobenzaprine hcl) .Marland Kitchen.. 1 by mouth 2 times daily as needed for back pain 13)  Ibuprofen 600 Mg Tabs (Ibuprofen) .Marland Kitchen.. 1 by mouth two times a day  prn  Patient Instructions: 1)  Please schedule a follow-up appointment in 4 months. 2)  BMP prior to visit, ICD-9: 3)  Lipid Panel prior to visit, ICD-9: 4)  HbgA1C prior to visit, ICD-9:250.00 5)  B12  266.20 Prescriptions: CYCLOBENZAPRINE HCL 10 MG  TABS (CYCLOBENZAPRINE HCL) 1 by mouth 2 times daily as needed for back pain  #30 x 1   Entered and Authorized by:   Tresa Garter MD   Signed by:   Tresa Garter MD on 04/03/2009   Method used:   Electronically to        [facility] (retail)       [address]       [place], [place]  [address]       Ph: [phone]       Fax: [phone]   RxID:  [id number] VENTOLIN HFA 108 (90 BASE) MCG/ACT AERS (ALBUTEROL SULFATE) as directed as needed  #1 x 12   Entered and Authorized by:   Tresa Garter MD   Signed by:   Tresa Garter MD on 04/03/2009   Method used:   Electronically to        [facility] (retail)       [address]       [place], [place]  [address]       Ph: [phone]       Fax: [phone]   RxID:   [id number] AVIANE 0.1-20 MG-MCG TABS (LEVONORGESTREL-ETHINYL ESTRAD) as dirr.  #28 Each x 11   Entered and Authorized by:   Tresa Garter MD   Signed by:   Tresa Garter MD on 04/03/2009   Method used:   Electronically to        [facility] (retail)       [address]       [place], [place]  [address]       Ph: [phone]       Fax: [phone]   RxID:   [id number] ADVAIR DISKUS 250-50 MCG/DOSE MISC (FLUTICASONE-SALMETEROL) 1 puff 2 times daily  #1 x 12   Entered and Authorized by:   Tresa Garter MD   Signed by:   Tresa Garter MD on 04/03/2009   Method used:   Electronically to        [facility] (retail)       [address]       [place], [place]  [address]       Ph: [phone]       Fax: [phone]   RxID:    [id number] HYDROCHLOROTHIAZIDE 12.5 MG  TABS (HYDROCHLOROTHIAZIDE) Take 1 tab  by mouth every morning  #30 x 11   Entered and Authorized by:   Tresa Garter MD   Signed by:   Tresa Garter MD on 04/03/2009   Method used:   Electronically to        [facility] (retail)       [address]       [place], [place]  [address]       Ph: [phone]       Fax: [phone]   RxID:   [id number] LORATADINE 10 MG  TABS (LORATADINE) once daily as needed allergies  #30 x 12   Entered and Authorized by:   Tresa Garter MD   Signed by:   Tresa Garter MD on 04/03/2009   Method used:   Electronically to        [facility] (retail)       [address]       [place], [place]  [address]       Ph: [phone]       Fax: [phone]   RxID:   [id number] GLIMEPIRIDE 1 MG  TABS (GLIMEPIRIDE) 1/2 once daily  #30 Each x 11   Entered and Authorized by:   Tresa Garter MD   Signed by:   Tresa Garter MD on 04/03/2009   Method used:   [facility]ron[facility]  [facility]       [address]       [place], [place]  [address]       Ph: [phone]       Fax: [phone]   RxID:   [id number] METFORMIN HCL 500 MG  TABS (METFORMIN HCL) 2 tabs two times a day  #120 x 12   Entered and Authorized by:   Tresa Garter MD   Signed by:   Tresa Garter MD on 04/03/2009   Method used:   Electronically to        [facility] (retail)       [address]       [place], [place]  [address]       Ph: [phone]       Fax: [phone]   RxID:   [id number]

## 2010-04-20 NOTE — Progress Notes (Signed)
  Phone Note Call from Patient   Caller: Patient Summary of Call: Patient called stating she received a call regarding lab work and told to pick up a rx from pharmacy, however they do not have it. Called patient and she request rx sent to [facility]. Initial call taken by: Rock Nephew CMA,  December 10, 2009 2:48 PM    Prescriptions: GLIMEPIRIDE 1 MG  TABS (GLIMEPIRIDE) 1/2 tab by mouth bid  #60 x 12   Entered by:   Rock Nephew CMA   Authorized by:   Tresa Garter MD   Signed by:   Rock Nephew CMA on 12/10/2009   Method used:   Electronically to        [facility] (retail)       [address]       [place], [place]  [address]       Ph: [phone]       Fax: [phone]   RxID:   [id number]

## 2010-04-20 NOTE — Assessment & Plan Note (Signed)
Summary: 4 MTH FU  STC   Vital Signs:  Patient profile:   45 year old female Height:      69 inches Weight:      181 pounds BMI:     26.83 Temp:     97.0 degrees F oral Pulse rate:   88 / minute Pulse rhythm:   regular Resp:     16 per minute BP sitting:   118 / 80  (left arm) Cuff size:   regular  Vitals Entered By: Lanier Prude, CMA(AAMA) (December 07, 2009 8:04 AM) CC: 4 mo f/u c/o bilat leg cramps Is Patient Diabetic? Yes   Primary Care Provider:  Tresa Garter MD  CC:  4 mo f/u c/o bilat leg cramps.  History of Present Illness: The patient presents for a follow up of hypertension, diabetes, vit B12 def   Preventive Screening-Counseling & Management  Alcohol-Tobacco     Smoking Status: never  Current Medications (verified): 1)  Glimepiride 1 Mg  Tabs (Glimepiride) .... 1/2 Once Daily 2)  Lamisil At Athletes Foot 1 %  Crea (Terbinafine Hcl) .... As Needed 3)  Vitamin D3 1000 Unit  Tabs (Cholecalciferol) .Marland Kitchen.. 1 By Mouth Daily 4)  Cvs Vitamin B12 1000 Mcg  Tabs (Cyanocobalamin) .... Once Daily 5)  Loratadine 10 Mg  Tabs (Loratadine) .... Once Daily As Needed Allergies 6)  Hydrochlorothiazide 12.5 Mg  Tabs (Hydrochlorothiazide) .... Take 1 Tab  By Mouth Every Morning 7)  Advair Diskus 250-50 Mcg/dose Misc (Fluticasone-Salmeterol) .Marland Kitchen.. 1 Puff 2 Times Daily 8)  Aviane 0.1-20 Mg-Mcg Tabs (Levonorgestrel-Ethinyl Estrad) .... As Dirr. 9)  Ventolin Hfa 108 (90 Base) Mcg/act Aers (Albuterol Sulfate) .... As Directed As Needed 10)  Fluticasone Propionate 50 Mcg/act  Susp (Fluticasone Propionate) .... 2 Sprays Each Nostril Once Daily 11)  Cyclobenzaprine Hcl 10 Mg  Tabs (Cyclobenzaprine Hcl) .Marland Kitchen.. 1 By Mouth 2 Times Daily As Needed For Back Pain 12)  Ibuprofen 600 Mg  Tabs (Ibuprofen) .Marland Kitchen.. 1 By Mouth Two Times A Day X10 D, Then As Needed 13)  Ranitidine Hcl 300 Mg Tabs (Ranitidine Hcl) .Marland Kitchen.. 1 By Mouth Once Daily For Indigestion 14)  Metformin Hcl 1000 Mg Tabs (Metformin  Hcl) .... Take 1 Two Times A Day  Allergies (verified): 1)  ! Ultram (Tramadol Hcl) 2)  Lotensin 3)  * Cleocin  Vag  Past History:  Past Medical History: Last updated: 07/06/2007 Diabetes mellitus, type II Low back pain Osteoarthritis CTS ? 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    Physical Exam  General:  alert and overweight-appearing (mild) Ears:  R ear normal and L ear normal.   Nose:  Erythematous throat mucosa and intranasal erythema.  Mouth:  no gingival abnormalities and pharynx pink and moist.   Neck:  supple and no masses.   Lungs:  CTA Heart:  RRR Abdomen:  soft and non-tender.   Msk:  Lumbar-sacral spine is tender to palpation over paraspinal muscles and painfull with the ROM  Extremities:  no edema, no ulcers  Neurologic:  alert , cranial nerves II-XII intact, strength normal in all extremities Skin:  Intact without suspicious lesions or rashes Psych:  Cognition and judgment appear intact. Alert and cooperative with normal attention  span and concentration. No apparent delusions, illusions, hallucinations   Impression & Recommendations:  Problem # 1:  B12 DEFICIENCY (ICD-266.2) Assessment Unchanged  On the regimen of medicine(s) reflected in the chart    Orders: TLB-B12, Serum-Total ONLY (62130-Q65) TLB-BMP (Basic Metabolic Panel-BMET) (80048-METABOL) TLB-Hepatic/Liver Function Pnl (80076-HEPATIC) TLB-A1C / Hgb A1C (Glycohemoglobin) (83036-A1C) TLB-Microalbumin/Creat Ratio, Urine (82043-MALB)  Problem # 2:  HYPERTENSION (ICD-401.9) Assessment: Unchanged  Her updated medication list for this problem includes:     Hydrochlorothiazide 12.5 Mg Tabs (Hydrochlorothiazide) .Marland Kitchen... Take 1 tab  by mouth every morning  Orders: TLB-B12, Serum-Total ONLY (78469-G29) TLB-BMP (Basic Metabolic Panel-BMET) (80048-METABOL) TLB-Hepatic/Liver Function Pnl (80076-HEPATIC) TLB-A1C / Hgb A1C (Glycohemoglobin) (83036-A1C) TLB-Microalbumin/Creat Ratio, Urine (82043-MALB)  Problem # 3:  DIABETES MELLITUS, TYPE II (ICD-250.00) Assessment: Unchanged  Her updated medication list for this problem includes:    Glimepiride 1 Mg Tabs (Glimepiride) .Marland Kitchen... 1/2 tab by mouth bid    Metformin Hcl 1000 Mg Tabs (Metformin hcl) .Marland Kitchen... Take 1 two times a day  Orders: TLB-B12, Serum-Total ONLY (52841-L24) TLB-BMP (Basic Metabolic Panel-BMET) (80048-METABOL) TLB-Hepatic/Liver Function Pnl (80076-HEPATIC) TLB-A1C / Hgb A1C (Glycohemoglobin) (83036-A1C) TLB-Microalbumin/Creat Ratio, Urine (82043-MALB)  Problem # 4:  LOW BACK PAIN (ICD-724.2) Assessment: Improved  Her updated medication list for this problem includes:    Cyclobenzaprine Hcl 10 Mg Tabs (Cyclobenzaprine hcl) .Marland Kitchen... 1 by mouth 2 times daily as needed for back pain    Ibuprofen 600 Mg Tabs (Ibuprofen) .Marland Kitchen... 1 by mouth two times a day x10 d, then as needed  Complete Medication List: 1)  Glimepiride 1 Mg Tabs (Glimepiride) .... 1/2 tab by mouth bid 2)  Lamisil At Athletes Foot 1 % Crea (Terbinafine hcl) .... As needed 3)  Vitamin D3 1000 Unit Tabs (Cholecalciferol) .Marland Kitchen.. 1 by mouth daily 4)  Cvs Vitamin B12 1000 Mcg Tabs (Cyanocobalamin) .... Once daily 5)  Loratadine 10 Mg Tabs (Loratadine) .... Once daily as needed allergies 6)  Hydrochlorothiazide 12.5 Mg Tabs (Hydrochlorothiazide) .... Take 1 tab  by mouth every morning 7)  Advair Diskus 250-50 Mcg/dose Misc (Fluticasone-salmeterol) .Marland Kitchen.. 1 puff 2 times daily 8)  Aviane 0.1-20 Mg-mcg Tabs (Levonorgestrel-ethinyl estrad) .... As dirr. 9)  Ventolin Hfa 108 (90 Base) Mcg/act Aers (Albuterol sulfate) .... As directed as  needed 10)  Fluticasone Propionate 50 Mcg/act Susp (Fluticasone propionate) .... 2 sprays each nostril once daily 11)  Cyclobenzaprine Hcl 10 Mg Tabs (Cyclobenzaprine hcl) .Marland Kitchen.. 1 by mouth 2 times daily as needed for back pain 12)  Ibuprofen 600 Mg Tabs (Ibuprofen) .Marland Kitchen.. 1 by mouth two times a day x10 d, then as needed 13)  Ranitidine Hcl 300 Mg Tabs (Ranitidine hcl) .Marland Kitchen.. 1 by mouth once daily for indigestion 14)  Metformin Hcl 1000 Mg Tabs (Metformin hcl) .... Take 1 two times a day  Other Orders: Admin 1st Vaccine (40102) Flu Vaccine 20yrs + (72536)  Patient Instructions: 1)  Please schedule a follow-up appointment in 3 months well w/labs v70.0  250.00. Prescriptions: GLIMEPIRIDE 1 MG  TABS (GLIMEPIRIDE) 1/2 tab by mouth bid  #60 x 12   Entered and Authorized by:   Tresa Garter MD   Signed by:   Tresa Garter MD on 12/07/2009   Method used:   Print then Give to Patient   RxID:   [id number] METFORMIN HCL 1000 MG TABS (METFORMIN HCL) take 1 two times a day  #60 x 3   Entered and Authorized by:   Tresa Garter MD   Signed by:  Tresa Garter MD on 12/07/2009   Method used:   Print then Give to Patient   RxID:   [id number] RANITIDINE HCL 300 MG TABS (RANITIDINE HCL) 1 by mouth once daily for indigestion  #30 x 3   Entered and Authorized by:   Tresa Garter MD   Signed by:   Tresa Garter MD on 12/07/2009   Method used:   Print then Give to Patient   RxID:   [id number] IBUPROFEN 600 MG  TABS (IBUPROFEN) 1 by mouth two times a day x10 d, then as needed  #60 x 3   Entered and Authorized by:   Tresa Garter MD   Signed by:   Tresa Garter MD on 12/07/2009   Method used:   Print then Give to Patient   RxID:   [id number] CYCLOBENZAPRINE HCL 10 MG  TABS (CYCLOBENZAPRINE HCL) 1 by mouth 2 times daily as needed for back pain  #30 x 1   Entered and Authorized by:   Tresa Garter MD   Signed by:   Tresa Garter  MD on 12/07/2009   Method used:   Print then Give to Patient   RxID:   [id number] FLUTICASONE PROPIONATE 50 MCG/ACT  SUSP (FLUTICASONE PROPIONATE) 2 sprays each nostril once daily  #1 vial x 3   Entered and Authorized by:   Tresa Garter MD   Signed by:   Tresa Garter MD on 12/07/2009   Method used:   Print then Give to Patient   RxID:   [id number] VENTOLIN HFA 108 (90 BASE) MCG/ACT AERS (ALBUTEROL SULFATE) as directed as needed  #1 x 12   Entered and Authorized by:   Tresa Garter MD   Signed by:   Tresa Garter MD on 12/07/2009   Method used:   Print then Give to Patient   RxID:   [id number] AVIANE 0.1-20 MG-MCG TABS (LEVONORGESTREL-ETHINYL ESTRAD) as dirr.  #28 Each x 11   Entered and Authorized by:   Tresa Garter MD   Signed by:   Tresa Garter MD on 12/07/2009   Method used:   Print then Give to Patient   RxID:   [id number] ADVAIR DISKUS 250-50 MCG/DOSE MISC (FLUTICASONE-SALMETEROL) 1 puff 2 times daily  #1 x 12   Entered and Authorized by:   Tresa Garter MD   Signed by:   Tresa Garter MD on 12/07/2009   Method used:   Print then Give to Patient   RxID:   [id number] HYDROCHLOROTHIAZIDE 12.5 MG  TABS (HYDROCHLOROTHIAZIDE) Take 1 tab  by mouth every morning  #30 x 11   Entered and Authorized by:   Tresa Garter MD   Signed by:   Tresa Garter MD on 12/07/2009   Method used:   Print then Give to Patient   RxID:   [id number] LORATADINE 10 MG  TABS (LORATADINE) once daily as needed allergies  #30 x 12   Entered and Authorized by:   Tresa Garter MD   Signed by:   Tresa Garter MD on 12/07/2009   Method used:   Print then Give to Patient   RxID:   [id number] [facility] VITAMIN B12 1000 MCG  TABS (CYANOCOBALAMIN) once daily  #100 x 3   Entered and Authorized by:   Tresa Garter MD   Signed by:   Tresa Garter MD on 12/07/2009   Method used:   Print then Give  to  Patient   RxID:   [id number] VITAMIN D3 1000 UNIT  TABS (CHOLECALCIFEROL) 1 by mouth daily  #100 x 3   Entered and Authorized by:   Tresa Garter MD   Signed by:   Tresa Garter MD on 12/07/2009   Method used:   Print then Give to Patient   RxID:   [id number] GLIMEPIRIDE 1 MG  TABS (GLIMEPIRIDE) 1/2 once daily  #30 Each x 11   Entered and Authorized by:   Tresa Garter MD   Signed by:   Tresa Garter MD on 12/07/2009   Method used:   Print then Give to Patient   RxID:   [id number]   .lbflu   Flu Vaccine Consent Questions     Do you have a history of severe allergic reactions to this vaccine? no    Any prior history of allergic reactions to egg and/or gelatin? no    Do you have a sensitivity to the preservative Thimersol? no    Do you have a past history of Guillan-Barre Syndrome? no    Do you currently have an acute febrile illness? no    Have you ever had a severe reaction to latex? no    Vaccine information given and explained to patient? yes    Are you currently pregnant? no    Lot Number:[id number]   Exp Date:09/17/2009   Site Given  Left Deltoid IM Lanier Prude, CMA(AAMA)  December 07, 2009 12:00 PM

## 2010-04-22 NOTE — Assessment & Plan Note (Signed)
Summary: 3 MTH PHYSICAL--STC   Vital Signs:  Patient profile:   45 year old female Height:      69 inches Weight:      174 pounds BMI:     25.79 Temp:     97.5 degrees F oral Pulse rate:   68 / minute Pulse rhythm:   regular Resp:     16 per minute BP sitting:   122 / 84  (left arm) Cuff size:   regular  Vitals Entered By: Lanier Prude, Beverly Gust) (March 08, 2010 2:05 PM) CC: CPX Is Patient Diabetic? Yes Comments pt is unsure if she needs PAP smear. She states she does not have GYN yet   Primary Care Provider:  Tresa Garter MD  CC:  CPX.  History of Present Illness: The patient presents for a follow up of hypertension, diabetes, hyperlipidemia  Two Metformins cause Nausea  Current Medications (verified): 1)  Glimepiride 1 Mg  Tabs (Glimepiride) .... 1/2 Tab By Mouth Bid 2)  Lamisil At Athletes Foot 1 %  Crea (Terbinafine Hcl) .... As Needed 3)  Vitamin D3 1000 Unit  Tabs (Cholecalciferol) .Marland Kitchen.. 1 By Mouth Daily 4)  Cvs Vitamin B12 1000 Mcg  Tabs (Cyanocobalamin) .... Once Daily 5)  Loratadine 10 Mg  Tabs (Loratadine) .... Once Daily As Needed Allergies 6)  Hydrochlorothiazide 12.5 Mg  Tabs (Hydrochlorothiazide) .... Take 1 Tab  By Mouth Every Morning 7)  Advair Diskus 250-50 Mcg/dose Misc (Fluticasone-Salmeterol) .Marland Kitchen.. 1 Puff 2 Times Daily 8)  Aviane 0.1-20 Mg-Mcg Tabs (Levonorgestrel-Ethinyl Estrad) .... As Dirr. 9)  Ventolin Hfa 108 (90 Base) Mcg/act Aers (Albuterol Sulfate) .... As Directed As Needed 10)  Fluticasone Propionate 50 Mcg/act  Susp (Fluticasone Propionate) .... 2 Sprays Each Nostril Once Daily 11)  Cyclobenzaprine Hcl 10 Mg  Tabs (Cyclobenzaprine Hcl) .Marland Kitchen.. 1 By Mouth 2 Times Daily As Needed For Back Pain 12)  Ibuprofen 600 Mg  Tabs (Ibuprofen) .Marland Kitchen.. 1 By Mouth Two Times A Day X10 D, Then As Needed 13)  Ranitidine Hcl 300 Mg Tabs (Ranitidine Hcl) .Marland Kitchen.. 1 By Mouth Once Daily For Indigestion 14)  Metformin Hcl 1000 Mg Tabs (Metformin Hcl) .... Take 1  Two Times A Day  Allergies (verified): 1)  ! Ultram (Tramadol Hcl) 2)  Lotensin 3)  * Cleocin  Vag  Past History:  Past Medical History: Last updated: 07/06/2007 Diabetes mellitus, type II Low back pain Osteoarthritis CTS ? glaucoma Polyarthralgia   Dr Kellie Simmering Asthma Hypertension Vit B12 def Allergic rhinitis  Social History: Last updated: 03/08/2007 Occupation: plant worker Single Never Smoked  Review of Systems  The patient denies fever, dyspnea on exertion, peripheral edema, and abdominal pain.    Physical Exam  General:  alert and overweight-appearing (mild) Nose:  Erythematous throat mucosa and intranasal erythema.  Mouth:  no gingival abnormalities and pharynx pink and moist.   Lungs:  CTA Heart:  RRR Abdomen:  soft and non-tender.   Msk:  Lumbar-sacral spine is tender to palpation over paraspinal muscles and painfull with the ROM  Neurologic:  alert , cranial nerves II-XII intact, strength normal in all extremities Skin:  Intact without suspicious lesions or rashes Psych:  Cognition and judgment appear intact. Alert and cooperative with normal attention span and concentration. No apparent delusions, illusions, hallucinations   Impression & Recommendations:  Problem # 1:  DIABETES MELLITUS, TYPE II (ICD-250.00) Assessment Deteriorated  The following medications were removed from the medication list:    Metformin Hcl 1000 Mg Tabs (Metformin  hcl) ..... Take 1 two times a day Her updated medication list for this problem includes:    Glimepiride 1 Mg Tabs (Glimepiride) .Marland Kitchen... 1/2 tab by mouth bid    Kombiglyze Xr 07-998 Mg Xr24h-tab (Saxagliptin-metformin) .Marland Kitchen... 1 by mouth once daily for diabetes  Orders: TLB-A1C / Hgb A1C (Glycohemoglobin) (83036-A1C) TLB-B12, Serum-Total ONLY (29562-Z30) TLB-BMP (Basic Metabolic Panel-BMET) (80048-METABOL)  Labs Reviewed: Creat: 0.7 (12/07/2009)    Reviewed HgBA1c results: 7.9 (12/07/2009)  6.8  (08/06/2009)  Problem # 2:  HYPERTENSION (ICD-401.9) Assessment: Unchanged  Her updated medication list for this problem includes:    Hydrochlorothiazide 12.5 Mg Tabs (Hydrochlorothiazide) .Marland Kitchen... Take 1 tab  by mouth every morning  Orders: TLB-A1C / Hgb A1C (Glycohemoglobin) (83036-A1C) TLB-B12, Serum-Total ONLY (86578-I69) TLB-BMP (Basic Metabolic Panel-BMET) (80048-METABOL)  Problem # 3:  B12 DEFICIENCY (ICD-266.2) Assessment: Unchanged  Orders: TLB-A1C / Hgb A1C (Glycohemoglobin) (83036-A1C) TLB-B12, Serum-Total ONLY (62952-W41) TLB-BMP (Basic Metabolic Panel-BMET) (80048-METABOL)  Problem # 4:  LOW BACK PAIN (ICD-724.2) Assessment: Improved  Her updated medication list for this problem includes:    Cyclobenzaprine Hcl 10 Mg Tabs (Cyclobenzaprine hcl) .Marland Kitchen... 1 by mouth 2 times daily as needed for back pain    Ibuprofen 600 Mg Tabs (Ibuprofen) .Marland Kitchen... 1 by mouth two times a day x10 d, then as needed  Problem # 5:  ASTHMA (ICD-493.90) Assessment: Improved  Her updated medication list for this problem includes:    Advair Diskus 250-50 Mcg/dose Misc (Fluticasone-salmeterol) .Marland Kitchen... 1 puff 2 times daily    Ventolin Hfa 108 (90 Base) Mcg/act Aers (Albuterol sulfate) .Marland Kitchen... As directed as needed  Complete Medication List: 1)  Glimepiride 1 Mg Tabs (Glimepiride) .... 1/2 tab by mouth bid 2)  Lamisil At Athletes Foot 1 % Crea (Terbinafine hcl) .... As needed 3)  Vitamin D3 1000 Unit Tabs (Cholecalciferol) .Marland Kitchen.. 1 by mouth daily 4)  Cvs Vitamin B12 1000 Mcg Tabs (Cyanocobalamin) .... Once daily 5)  Loratadine 10 Mg Tabs (Loratadine) .... Once daily as needed allergies 6)  Hydrochlorothiazide 12.5 Mg Tabs (Hydrochlorothiazide) .... Take 1 tab  by mouth every morning 7)  Advair Diskus 250-50 Mcg/dose Misc (Fluticasone-salmeterol) .Marland Kitchen.. 1 puff 2 times daily 8)  Aviane 0.1-20 Mg-mcg Tabs (Levonorgestrel-ethinyl estrad) .... As dirr. 9)  Ventolin Hfa 108 (90 Base) Mcg/act Aers (Albuterol  sulfate) .... As directed as needed 10)  Fluticasone Propionate 50 Mcg/act Susp (Fluticasone propionate) .... 2 sprays each nostril once daily 11)  Cyclobenzaprine Hcl 10 Mg Tabs (Cyclobenzaprine hcl) .Marland Kitchen.. 1 by mouth 2 times daily as needed for back pain 12)  Ibuprofen 600 Mg Tabs (Ibuprofen) .Marland Kitchen.. 1 by mouth two times a day x10 d, then as needed 13)  Ranitidine Hcl 300 Mg Tabs (Ranitidine hcl) .Marland Kitchen.. 1 by mouth once daily for indigestion 14)  Kombiglyze Xr 07-998 Mg Xr24h-tab (Saxagliptin-metformin) .Marland Kitchen.. 1 by mouth once daily for diabetes  Patient Instructions: 1)  Please schedule a follow-up appointment in 3 months. 2)  BMP prior to visit, ICD-9: 3)  HbgA1C prior to visit, ICD-9:250.00 Prescriptions: RANITIDINE HCL 300 MG TABS (RANITIDINE HCL) 1 by mouth once daily for indigestion  #30 x 3   Entered and Authorized by:   Tresa Garter MD   Signed by:   Tresa Garter MD on 03/08/2010   Method used:   Print then Give to Patient   RxID:   [id number] IBUPROFEN 600 MG  TABS (IBUPROFEN) 1 by mouth two times a day x10 d, then as needed  #60 x 3  Entered and Authorized by:   Tresa Garter MD   Signed by:   Tresa Garter MD on 03/08/2010   Method used:   Print then Give to Patient   RxID:   [id number] CYCLOBENZAPRINE HCL 10 MG  TABS (CYCLOBENZAPRINE HCL) 1 by mouth 2 times daily as needed for back pain  #30 x 1   Entered and Authorized by:   Tresa Garter MD   Signed by:   Tresa Garter MD on 03/08/2010   Method used:   Print then Give to Patient   RxID:   [id number] FLUTICASONE PROPIONATE 50 MCG/ACT  SUSP (FLUTICASONE PROPIONATE) 2 sprays each nostril once daily  #16 Gram x 2   Entered and Authorized by:   Tresa Garter MD   Signed by:   Tresa Garter MD on 03/08/2010   Method used:   Print then Give to Patient   RxID:   [id number] VENTOLIN HFA 108 (90 BASE) MCG/ACT AERS (ALBUTEROL SULFATE) as directed as needed  #1 x 12    Entered and Authorized by:   Tresa Garter MD   Signed by:   Tresa Garter MD on 03/08/2010   Method used:   Print then Give to Patient   RxID:   [id number] AVIANE 0.1-20 MG-MCG TABS (LEVONORGESTREL-ETHINYL ESTRAD) as dirr.  #28 Each x 5   Entered and Authorized by:   Tresa Garter MD   Signed by:   Tresa Garter MD on 03/08/2010   Method used:   Print then Give to Patient   RxID:   [id number] ADVAIR DISKUS 250-50 MCG/DOSE MISC (FLUTICASONE-SALMETEROL) 1 puff 2 times daily  #1 x 12   Entered and Authorized by:   Tresa Garter MD   Signed by:   Tresa Garter MD on 03/08/2010   Method used:   Print then Give to Patient   RxID:   [id number] HYDROCHLOROTHIAZIDE 12.5 MG  TABS (HYDROCHLOROTHIAZIDE) Take 1 tab  by mouth every morning  #30 x 11   Entered and Authorized by:   Tresa Garter MD   Signed by:   Tresa Garter MD on 03/08/2010   Method used:   Print then Give to Patient   RxID:   [id number] LORATADINE 10 MG  TABS (LORATADINE) once daily as needed allergies  #30 x 12   Entered and Authorized by:   Tresa Garter MD   Signed by:   Tresa Garter MD on 03/08/2010   Method used:   Print then Give to Patient   RxID:   [id number] [facility]S VITAMIN B12 1000 MCG  TABS (CYANOCOBALAMIN) once daily  #100 x 3   Entered and Authorized by:   Tresa Garter MD   Signed by:   Tresa Garter MD on 03/08/2010   Method used:   Print then Give to Patient   RxID:   [id number] VITAMIN D3 1000 UNIT  TABS (CHOLECALCIFEROL) 1 by mouth daily  #100 x 3   Entered and Authorized by:   Tresa Garter MD   Signed by:   Tresa Garter MD on 03/08/2010   Method used:   Print then Give to Patient   RxID:   [id number] LAMISIL AT ATHLETES FOOT 1 %  CREA (TERBINAFINE HCL) as needed  #60 g x 1   Entered and Authorized by:   Tresa Garter MD   Signed by:   Tresa Garter MD on 03/08/2010  Method used:   Print then Give to Patient   RxID:   [id number] GLIMEPIRIDE 1 MG  TABS (GLIMEPIRIDE) 1/2 tab by mouth bid  #60 x 12   Entered and Authorized by:   Tresa Garter MD   Signed by:   Tresa Garter MD on 03/08/2010   Method used:   Print then Give to Patient   RxID:   [id number] KOMBIGLYZE XR 07-998 MG XR24H-TAB (SAXAGLIPTIN-METFORMIN) 1 by mouth once daily for diabetes  #30 x 11   Entered and Authorized by:   Tresa Garter MD   Signed by:   Tresa Garter MD on 03/08/2010   Method used:   Print then Give to Patient   RxID:   [id number]    Orders Added: 1)  TLB-A1C / Hgb A1C (Glycohemoglobin) [83036-A1C] 2)  TLB-B12, Serum-Total ONLY [82607-B12] 3)  TLB-BMP (Basic Metabolic Panel-BMET) [80048-METABOL] 4)  Est. Patient Level IV [87564]

## 2010-05-31 ENCOUNTER — Other Ambulatory Visit: Payer: Self-pay

## 2010-06-07 ENCOUNTER — Ambulatory Visit: Payer: Self-pay | Admitting: Internal Medicine

## 2010-06-09 ENCOUNTER — Ambulatory Visit: Payer: Self-pay | Admitting: Internal Medicine

## 2010-06-09 ENCOUNTER — Other Ambulatory Visit: Payer: Self-pay | Admitting: *Deleted

## 2010-06-09 MED ORDER — FLUTICASONE-SALMETEROL 250-50 MCG/DOSE IN AEPB: 1.0000 | INHALATION_SPRAY | Freq: Two times a day (BID) | RESPIRATORY_TRACT | Status: DC

## 2010-06-21 ENCOUNTER — Telehealth: Payer: Self-pay | Admitting: Internal Medicine

## 2010-06-21 ENCOUNTER — Encounter: Payer: Self-pay | Admitting: Internal Medicine

## 2010-06-21 ENCOUNTER — Ambulatory Visit (INDEPENDENT_AMBULATORY_CARE_PROVIDER_SITE_OTHER): Payer: PRIVATE HEALTH INSURANCE | Admitting: Internal Medicine

## 2010-06-21 ENCOUNTER — Other Ambulatory Visit (INDEPENDENT_AMBULATORY_CARE_PROVIDER_SITE_OTHER): Payer: PRIVATE HEALTH INSURANCE

## 2010-06-21 DIAGNOSIS — E538 Deficiency of other specified B group vitamins: Secondary | ICD-10-CM

## 2010-06-21 DIAGNOSIS — I10 Essential (primary) hypertension: Secondary | ICD-10-CM

## 2010-06-21 DIAGNOSIS — E119 Type 2 diabetes mellitus without complications: Secondary | ICD-10-CM

## 2010-06-21 DIAGNOSIS — J45909 Unspecified asthma, uncomplicated: Secondary | ICD-10-CM

## 2010-06-21 LAB — BASIC METABOLIC PANEL
BUN: 9 mg/dL (ref 6–23)
Potassium: 4 mEq/L (ref 3.5–5.1)
Sodium: 136 mEq/L (ref 135–145)

## 2010-06-21 MED ORDER — BUDESONIDE-FORMOTEROL FUMARATE 160-4.5 MCG/ACT IN AERO: 2.0000 | INHALATION_SPRAY | Freq: Two times a day (BID) | RESPIRATORY_TRACT | Status: DC

## 2010-06-21 MED ORDER — GLIMEPIRIDE 1 MG PO TABS: 1.0000 mg | ORAL_TABLET | Freq: Two times a day (BID) | ORAL | Status: DC

## 2010-06-21 NOTE — Progress Notes (Signed)
  Subjective:    Patient ID: Alexandra [name], female    DOB: 04-23-1965, 45 y.o.   MRN: [id number]  HPI  The patient presents for a follow-up of  chronic hypertension, asthma, chronic dyslipidemia, type 2 diabetes controlled with medicines  C/o Advair is too expensive  Review of Systems  Constitutional: Negative for activity change.  HENT: Negative for ear pain and sneezing.   Eyes: Negative for photophobia, pain, discharge and visual disturbance.  Respiratory: Negative for cough and wheezing.   Cardiovascular: Negative for chest pain.  Gastrointestinal: Negative for abdominal distention.  Genitourinary: Negative for decreased urine volume and difficulty urinating.  Musculoskeletal: Negative for arthralgias.  Skin: Negative for rash.  Neurological: Negative for dizziness and speech difficulty.  Psychiatric/Behavioral: Negative for sleep disturbance and agitation.       Objective:   Physical Exam  Constitutional: She appears well-developed and well-nourished. No distress.  HENT:  Head: Normocephalic.  Neck: No JVD present.  Cardiovascular: Normal rate and normal heart sounds.  Exam reveals no friction rub.   Pulmonary/Chest: Effort normal. No respiratory distress. She has no rales. She exhibits no tenderness.  Abdominal: She exhibits no distension. There is no rebound.  Genitourinary: Vagina normal.  Musculoskeletal: She exhibits no edema and no tenderness.  Lymphadenopathy:    She has no cervical adenopathy.  Neurological: No cranial nerve deficit. Coordination normal.  Skin: Skin is warm. No rash noted.  Psychiatric: She has a normal mood and affect. Judgment normal.          Assessment & Plan:  ASTHMA Will switch to another MDI combo  B12 DEFICIENCY On Rx  DIABETES MELLITUS, TYPE II Cont Rx. Will det labs today  HYPERTENSION Cont Rx    Lab Results  Component Value Date   WBC 8.3 03/03/2008   HGB TRACE-INTACT 03/03/2008   HGB 13.5 03/03/2008   HCT 39.4  03/03/2008   PLT 289 03/03/2008   CHOL 199 08/06/2009   TRIG 93.0 08/06/2009   HDL 52.70 08/06/2009   LDLDIRECT 122.0 03/03/2008   ALT 21 12/07/2009   AST 22 12/07/2009   NA 140 03/08/2010   K 4.5 03/08/2010   CL 100 03/08/2010   CREATININE 0.5 03/08/2010   BUN 10 03/08/2010   CO2 27 03/08/2010   TSH 2.75 04/03/2009   HGBA1C 7.8* 03/08/2010   MICROALBUR 0.4 12/07/2009

## 2010-06-21 NOTE — Assessment & Plan Note (Signed)
Cont Rx. Will det labs today

## 2010-06-21 NOTE — Assessment & Plan Note (Signed)
On Rx 

## 2010-06-21 NOTE — Assessment & Plan Note (Signed)
Will switch to another MDI combo

## 2010-06-21 NOTE — Telephone Encounter (Signed)
Alexandra [name] , please, inform the patient:  the labs are normal, except for elev Hgb A1c. She needs to take Glimepiride 1 mg bid.   Please, keep  next office visit appointment.   Thank you !

## 2010-06-21 NOTE — Assessment & Plan Note (Signed)
Cont Rx 

## 2010-08-06 NOTE — Assessment & Plan Note (Signed)
[facility]                           PRIMARY CARE OFFICE NOTE   NAME:Alexandra [name]                       MRN:          [id number]  DATE:07/04/2006                            DOB:          1965/12/22    The patient is a 45 year old female who presents for a well examination.   PAST MEDICAL HISTORY, FAMILY HISTORY, SOCIAL HISTORY:  As per September 13, 2005 note.   CURRENT MEDICATIONS:  1. Metformin ER 500 mg b.i.d.  2. Amaryl 1 mg in the morning.  3. Vitamin B12 1000 mcg daily.  4. Hydrochlorothiazide 25 mg daily.  5. Asthma medicines p.r.n.   REVIEW OF SYSTEMS:  Occasional low blood sugar at night time when she  forgets to eat, had a 44 reading once.  No problems with asthma.  No  chest pain or shortness of breath.  Complains of face flushing and  sweats at night.  Metformin makes her stomach upset at times.  Vaginal  discharge off and on.  The rest of the 18 point review of system is  negative.   PHYSICAL EXAMINATION:  Blood pressure 116/73, pulse 95, temp 99.7,  weight 181 pounds.  She is in no acute distress. Looks well.  HEENT:  Moist mucosa.  NECK:  Supple.  No thyromegaly or bruit.  LUNGS:  Clear.  No wheeze or rales.  HEART:  S1, S2.  No murmur, no gallop.  ABDOMEN:  Soft, nontender.  No organomegaly, no masses felt.  LOWER EXTREMITIES:  Is without edema.  She is alert, oriented and cooperative.  Denies being depressed.  Face  is somewhat flushed with faint papular rash over the cheeks and the  mouth.  BREASTS:  Symmetric.  Nipples without discharge.  No masses, no axillar  lymphadenopathy.  Normal external genitalia, slightly excessive discharge, speculum  introduced, cervix visualized, Pap smear obtained.  KOH smear obtained.  Bimanual examination is normal.  No masses felt.   LABS:  On July 03, 2006 glucose 124, A1C 7.3, LDL 119.   ASSESSMENT AND PLAN:  1. Normal well examination.  Age/health related issues discussed.  Healthy lifestyle discussed.  She will take calcium and vitamin D.      I will see her back in 4 months.  2. Type 2 diabetes.  Cut back on soft drinks.  Continue current      therapy.  3. Occasional evening hypoglycemia.  She was instructed to take Amaryl      1/2 twice a day (1 mg tablets).  If skips dinner, she will not take      the second half.  The metformin ER 500 p.o. b.i.d. as before.  4. Hypertension, well controlled.  Continue current therapy with      hydrochlorothiazide 25 mg daily.  5. Vitamin B12 deficiency.  She is on the 1000 mcg B12 p.o. daily.  6. Vaginitis.  KOH pending.  Disposition upon results.  7. Likely rosacea.  Given erythromycin cream to use b.i.d. on the      face.  Should help with the sweats too.     Macarthur Critchley  Buckner Malta, MD  Electronically Signed    AVP/MedQ  DD: 07/04/2006  DT: 07/04/2006  Job #: [id number]

## 2010-08-25 ENCOUNTER — Other Ambulatory Visit: Payer: Self-pay | Admitting: *Deleted

## 2010-08-25 MED ORDER — LORATADINE 10 MG PO TABS: 10.0000 mg | ORAL_TABLET | Freq: Every day | ORAL | Status: DC | PRN

## 2010-08-25 MED ORDER — HYDROCHLOROTHIAZIDE 12.5 MG PO CAPS: 12.5000 mg | ORAL_CAPSULE | ORAL | Status: DC

## 2010-08-25 MED ORDER — LEVONORGESTREL-ETHINYL ESTRAD 0.1-20 MG-MCG PO TABS: 1.0000 | ORAL_TABLET | ORAL | Status: DC

## 2010-10-27 ENCOUNTER — Encounter: Payer: Self-pay | Admitting: Internal Medicine

## 2010-10-27 ENCOUNTER — Other Ambulatory Visit (INDEPENDENT_AMBULATORY_CARE_PROVIDER_SITE_OTHER): Payer: Commercial Managed Care - PPO

## 2010-10-27 ENCOUNTER — Telehealth: Payer: Self-pay | Admitting: Internal Medicine

## 2010-10-27 ENCOUNTER — Ambulatory Visit (INDEPENDENT_AMBULATORY_CARE_PROVIDER_SITE_OTHER): Payer: Commercial Managed Care - PPO | Admitting: Internal Medicine

## 2010-10-27 VITALS — BP 120/80 | HR 80 | Temp 97.1°F | Resp 16 | Wt 184.0 lb

## 2010-10-27 DIAGNOSIS — M545 Low back pain, unspecified: Secondary | ICD-10-CM

## 2010-10-27 DIAGNOSIS — J45909 Unspecified asthma, uncomplicated: Secondary | ICD-10-CM

## 2010-10-27 DIAGNOSIS — E538 Deficiency of other specified B group vitamins: Secondary | ICD-10-CM

## 2010-10-27 DIAGNOSIS — E119 Type 2 diabetes mellitus without complications: Secondary | ICD-10-CM

## 2010-10-27 DIAGNOSIS — I10 Essential (primary) hypertension: Secondary | ICD-10-CM

## 2010-10-27 LAB — BASIC METABOLIC PANEL
CO2: 28 mEq/L (ref 19–32)
Calcium: 9.9 mg/dL (ref 8.4–10.5)
Glucose, Bld: 156 mg/dL — ABNORMAL HIGH (ref 70–99)
Sodium: 140 mEq/L (ref 135–145)

## 2010-10-27 LAB — HEMOGLOBIN A1C: Hgb A1c MFr Bld: 7.6 % — ABNORMAL HIGH (ref 4.6–6.5)

## 2010-10-27 MED ORDER — SAXAGLIPTIN-METFORMIN ER 5-1000 MG PO TB24: 1.0000 | ORAL_TABLET | Freq: Every day | ORAL | Status: DC

## 2010-10-27 MED ORDER — GLIMEPIRIDE 1 MG PO TABS: 1.0000 mg | ORAL_TABLET | Freq: Two times a day (BID) | ORAL | Status: DC

## 2010-10-27 MED ORDER — HYDROCHLOROTHIAZIDE 12.5 MG PO CAPS: 12.5000 mg | ORAL_CAPSULE | ORAL | Status: DC

## 2010-10-27 MED ORDER — LEVONORGESTREL-ETHINYL ESTRAD 0.1-20 MG-MCG PO TABS: 1.0000 | ORAL_TABLET | ORAL | Status: DC

## 2010-10-27 MED ORDER — CYCLOBENZAPRINE HCL 10 MG PO TABS: 10.0000 mg | ORAL_TABLET | Freq: Two times a day (BID) | ORAL | Status: DC | PRN

## 2010-10-27 MED ORDER — RANITIDINE HCL 300 MG PO TABS: 300.0000 mg | ORAL_TABLET | Freq: Every day | ORAL | Status: DC

## 2010-10-27 MED ORDER — BUDESONIDE-FORMOTEROL FUMARATE 160-4.5 MCG/ACT IN AERO: 2.0000 | INHALATION_SPRAY | Freq: Two times a day (BID) | RESPIRATORY_TRACT | Status: DC

## 2010-10-27 MED ORDER — ALBUTEROL SULFATE HFA 108 (90 BASE) MCG/ACT IN AERS: 1.0000 | INHALATION_SPRAY | RESPIRATORY_TRACT | Status: DC | PRN

## 2010-10-27 NOTE — Assessment & Plan Note (Signed)
On Rx prn 

## 2010-10-27 NOTE — Telephone Encounter (Signed)
Alexandra [name], please, inform patient that all labs are better. Loose 5 lbs and eat well! Thx

## 2010-10-27 NOTE — Assessment & Plan Note (Signed)
On Rx 

## 2010-10-27 NOTE — Assessment & Plan Note (Signed)
Cont Rx Lab Results  Component Value Date   WBC 8.3 03/03/2008   HGB 13.5 03/03/2008   HCT 39.4 03/03/2008   PLT 289 03/03/2008   CHOL 199 08/06/2009   TRIG 93.0 08/06/2009   HDL 52.70 08/06/2009   LDLDIRECT 122.0 03/03/2008   ALT 21 12/07/2009   AST 22 12/07/2009   NA 140 10/27/2010   K 4.8 10/27/2010   CL 102 10/27/2010   CREATININE 0.7 10/27/2010   BUN 11 10/27/2010   CO2 28 10/27/2010   TSH 2.75 04/03/2009   HGBA1C 7.6* 10/27/2010   MICROALBUR 0.4 12/07/2009

## 2010-10-27 NOTE — Progress Notes (Signed)
  Subjective:    Patient ID: Alexandra [name], female    DOB: 01-May-1965, 45 y.o.   MRN: [id number]  HPI   The patient presents for a follow-up of  chronic hypertension, chronic dyslipidemia, asthma,  type 2 diabetes controlled with medicines    Review of Systems  Constitutional: Negative for chills, activity change, appetite change, fatigue and unexpected weight change.  HENT: Negative for congestion, mouth sores and sinus pressure.   Eyes: Negative for visual disturbance.  Respiratory: Negative for cough and chest tightness.   Gastrointestinal: Negative for nausea and abdominal pain.  Genitourinary: Negative for frequency, difficulty urinating and vaginal pain.  Musculoskeletal: Negative for back pain and gait problem.  Skin: Negative for pallor and rash.  Neurological: Negative for dizziness, tremors, weakness, numbness and headaches.  Psychiatric/Behavioral: Negative for confusion and sleep disturbance.       Objective:   Physical Exam  Constitutional: She appears well-developed and well-nourished. No distress.  HENT:  Head: Normocephalic.  Right Ear: External ear normal.  Left Ear: External ear normal.  Nose: Nose normal.  Mouth/Throat: Oropharynx is clear and moist.  Eyes: Conjunctivae are normal. Pupils are equal, round, and reactive to light. Right eye exhibits no discharge. Left eye exhibits no discharge.  Neck: Normal range of motion. Neck supple. No JVD present. No tracheal deviation present. No thyromegaly present.  Cardiovascular: Normal rate, regular rhythm and normal heart sounds.   Pulmonary/Chest: No stridor. No respiratory distress. She has no wheezes.  Abdominal: Soft. Bowel sounds are normal. She exhibits no distension and no mass. There is no tenderness. There is no rebound and no guarding.  Musculoskeletal: She exhibits no edema and no tenderness.  Lymphadenopathy:    She has no cervical adenopathy.  Neurological: She displays normal reflexes. No cranial  nerve deficit. She exhibits normal muscle tone. Coordination normal.  Skin: No rash noted. No erythema.  Psychiatric: She has a normal mood and affect. Her behavior is normal. Judgment and thought content normal.          Assessment & Plan:

## 2010-10-28 NOTE — Telephone Encounter (Signed)
Pt informed

## 2010-10-30 ENCOUNTER — Emergency Department (HOSPITAL_COMMUNITY)
Admission: EM | Admit: 2010-10-30 | Discharge: 2010-10-30 | Disposition: A | Discharge status: Home / Self Care | Payer: No Typology Code available for payment source | Attending: Emergency Medicine | Admitting: Emergency Medicine

## 2010-10-30 DIAGNOSIS — I10 Essential (primary) hypertension: Secondary | ICD-10-CM | POA: Insufficient documentation

## 2010-10-30 DIAGNOSIS — M545 Low back pain, unspecified: Secondary | ICD-10-CM | POA: Insufficient documentation

## 2010-10-30 DIAGNOSIS — T148XXA Other injury of unspecified body region, initial encounter: Secondary | ICD-10-CM | POA: Insufficient documentation

## 2010-10-30 DIAGNOSIS — M25519 Pain in unspecified shoulder: Secondary | ICD-10-CM | POA: Insufficient documentation

## 2010-10-30 DIAGNOSIS — M542 Cervicalgia: Secondary | ICD-10-CM | POA: Insufficient documentation

## 2010-10-30 DIAGNOSIS — R51 Headache: Secondary | ICD-10-CM | POA: Insufficient documentation

## 2010-10-30 DIAGNOSIS — E119 Type 2 diabetes mellitus without complications: Secondary | ICD-10-CM | POA: Insufficient documentation

## 2010-10-31 ENCOUNTER — Telehealth: Payer: Self-pay | Admitting: Internal Medicine

## 2010-10-31 NOTE — Telephone Encounter (Signed)
Alexandra [name] , please, inform the patient: labs are OK, except for diabetic test: better but  Not at goal. Improve diet pls   Please, keep  next office visit appointment.   Thank you !

## 2010-11-02 ENCOUNTER — Other Ambulatory Visit: Payer: Self-pay | Admitting: Obstetrics and Gynecology

## 2010-11-02 DIAGNOSIS — Z1231 Encounter for screening mammogram for malignant neoplasm of breast: Secondary | ICD-10-CM

## 2010-11-02 NOTE — Telephone Encounter (Signed)
Called home phone lmovm // Called mobile phone and advised pt per MD

## 2010-11-08 ENCOUNTER — Ambulatory Visit
Admission: RE | Admit: 2010-11-08 | Discharge: 2010-11-08 | Disposition: A | Discharge status: Home / Self Care | Payer: Commercial Managed Care - PPO | Source: Ambulatory Visit | Attending: Obstetrics and Gynecology | Admitting: Obstetrics and Gynecology

## 2010-11-08 DIAGNOSIS — Z1231 Encounter for screening mammogram for malignant neoplasm of breast: Secondary | ICD-10-CM

## 2011-02-09 ENCOUNTER — Encounter: Payer: Self-pay | Admitting: Internal Medicine

## 2011-02-09 ENCOUNTER — Other Ambulatory Visit: Payer: Self-pay | Admitting: *Deleted

## 2011-02-09 ENCOUNTER — Ambulatory Visit (INDEPENDENT_AMBULATORY_CARE_PROVIDER_SITE_OTHER): Payer: Commercial Managed Care - PPO | Admitting: Internal Medicine

## 2011-02-09 VITALS — BP 120/88 | HR 80 | Temp 98.2°F | Resp 16 | Wt 182.0 lb

## 2011-02-09 DIAGNOSIS — Z23 Encounter for immunization: Secondary | ICD-10-CM

## 2011-02-09 DIAGNOSIS — E119 Type 2 diabetes mellitus without complications: Secondary | ICD-10-CM

## 2011-02-09 DIAGNOSIS — J209 Acute bronchitis, unspecified: Secondary | ICD-10-CM

## 2011-02-09 DIAGNOSIS — J45909 Unspecified asthma, uncomplicated: Secondary | ICD-10-CM

## 2011-02-09 DIAGNOSIS — E538 Deficiency of other specified B group vitamins: Secondary | ICD-10-CM

## 2011-02-09 DIAGNOSIS — I10 Essential (primary) hypertension: Secondary | ICD-10-CM

## 2011-02-09 MED ORDER — AZITHROMYCIN 250 MG PO TABS: ORAL_TABLET | ORAL | Status: AC

## 2011-02-09 MED ORDER — BUDESONIDE-FORMOTEROL FUMARATE 160-4.5 MCG/ACT IN AERO: 2.0000 | INHALATION_SPRAY | Freq: Two times a day (BID) | RESPIRATORY_TRACT | Status: DC

## 2011-02-09 MED ORDER — ALBUTEROL SULFATE HFA 108 (90 BASE) MCG/ACT IN AERS: 1.0000 | INHALATION_SPRAY | RESPIRATORY_TRACT | Status: DC | PRN

## 2011-02-09 MED ORDER — PROMETHAZINE-CODEINE 6.25-10 MG/5ML PO SYRP: 5.0000 mL | ORAL_SOLUTION | ORAL | Status: AC | PRN

## 2011-02-09 MED ORDER — SAXAGLIPTIN-METFORMIN ER 5-1000 MG PO TB24: 1.0000 | ORAL_TABLET | Freq: Every day | ORAL | Status: DC

## 2011-02-09 MED ORDER — CYCLOBENZAPRINE HCL 10 MG PO TABS: 10.0000 mg | ORAL_TABLET | Freq: Two times a day (BID) | ORAL | Status: DC | PRN

## 2011-02-09 MED ORDER — GLIMEPIRIDE 1 MG PO TABS: 1.0000 mg | ORAL_TABLET | Freq: Two times a day (BID) | ORAL | Status: DC

## 2011-02-09 MED ORDER — HYDROCHLOROTHIAZIDE 12.5 MG PO CAPS: 12.5000 mg | ORAL_CAPSULE | ORAL | Status: DC

## 2011-02-09 MED ORDER — LORATADINE 10 MG PO TABS: 10.0000 mg | ORAL_TABLET | Freq: Every day | ORAL | Status: DC | PRN

## 2011-02-09 MED ORDER — RANITIDINE HCL 300 MG PO TABS: 300.0000 mg | ORAL_TABLET | Freq: Every day | ORAL | Status: DC

## 2011-02-09 NOTE — Progress Notes (Signed)
  Subjective:    Patient ID: Alexandra [name], female    DOB: March 31, 1965, 45 y.o.   MRN: [id number]  HPI   HPI  The patient presents for a follow-up of  chronic hypertension, chronic dyslipidemia, type 2 diabetes controlled with medicines   C/o URI sx's x  3 days. C/o ST, cough, weakness. Not better with OTC medicines. Actually, the patient is getting worse.  Review of Systems  Constitutional: Positive for fever, chills and fatigue.  HENT: Positive for congestion, rhinorrhea - yellow d/c, sneezing and postnasal drip.   Eyes: Neg for photophobia and pain. Negative for discharge and visual disturbance.  Respiratory: Positive for cough and wheezing.   Positive for chest pain.  Gastrointestinal: Negative for vomiting, abdominal pain, diarrhea and abdominal distention.  Genitourinary: Negative for dysuria and difficulty urinating.  Skin: Negative for rash.  Neurological: Neg for dizziness, weakness and light-headedness.      Review of Systems     Objective:   Physical Exam  Constitutional: She appears well-developed and well-nourished.  HENT:  Head: Normocephalic.  Right Ear: External ear normal.  Left Ear: External ear normal.  Nose: Nose normal.  Mouth/Throat: Oropharynx is clear and moist. No oropharyngeal exudate.       eryth throat  Eyes: Conjunctivae are normal. Pupils are equal, round, and reactive to light. Right eye exhibits no discharge. Left eye exhibits no discharge.  Neck: Normal range of motion. Neck supple. No JVD present. No tracheal deviation present. No thyromegaly present.  Cardiovascular: Normal rate, regular rhythm and normal heart sounds.   Pulmonary/Chest: No stridor. No respiratory distress. She has no wheezes.  Abdominal: Soft. Bowel sounds are normal. She exhibits no distension and no mass. There is no tenderness. There is no rebound and no guarding.  Musculoskeletal: She exhibits no edema and no tenderness.  Lymphadenopathy:    She has no cervical  adenopathy.  Neurological: She displays normal reflexes. No cranial nerve deficit. She exhibits normal muscle tone. Coordination normal.  Skin: No rash noted. No erythema.  Psychiatric: She has a normal mood and affect. Her behavior is normal. Judgment and thought content normal.          Assessment & Plan:

## 2011-02-09 NOTE — Assessment & Plan Note (Signed)
Continue with current prescription therapy as reflected on the Med list.  

## 2011-02-09 NOTE — Assessment & Plan Note (Signed)
Zpac 

## 2011-02-10 ENCOUNTER — Encounter: Payer: Self-pay | Admitting: Internal Medicine

## 2011-02-10 NOTE — Assessment & Plan Note (Signed)
Continue with current prescription therapy as reflected on the Med list.  

## 2011-03-20 ENCOUNTER — Other Ambulatory Visit: Payer: Self-pay | Admitting: Internal Medicine

## 2011-05-13 ENCOUNTER — Encounter: Payer: Self-pay | Admitting: Internal Medicine

## 2011-05-13 ENCOUNTER — Other Ambulatory Visit (INDEPENDENT_AMBULATORY_CARE_PROVIDER_SITE_OTHER): Payer: Commercial Managed Care - PPO

## 2011-05-13 ENCOUNTER — Ambulatory Visit (INDEPENDENT_AMBULATORY_CARE_PROVIDER_SITE_OTHER): Payer: Commercial Managed Care - PPO | Admitting: Internal Medicine

## 2011-05-13 VITALS — BP 128/80 | HR 80 | Temp 97.6°F | Resp 16 | Wt 180.0 lb

## 2011-05-13 DIAGNOSIS — E119 Type 2 diabetes mellitus without complications: Secondary | ICD-10-CM

## 2011-05-13 DIAGNOSIS — J45909 Unspecified asthma, uncomplicated: Secondary | ICD-10-CM

## 2011-05-13 DIAGNOSIS — I10 Essential (primary) hypertension: Secondary | ICD-10-CM

## 2011-05-13 DIAGNOSIS — M545 Low back pain, unspecified: Secondary | ICD-10-CM

## 2011-05-13 DIAGNOSIS — E538 Deficiency of other specified B group vitamins: Secondary | ICD-10-CM

## 2011-05-13 LAB — BASIC METABOLIC PANEL
BUN: 11 mg/dL (ref 6–23)
CO2: 29 mEq/L (ref 19–32)
Chloride: 102 mEq/L (ref 96–112)
Creatinine, Ser: 0.6 mg/dL (ref 0.4–1.2)
Glucose, Bld: 116 mg/dL — ABNORMAL HIGH (ref 70–99)

## 2011-05-13 MED ORDER — CHOLECALCIFEROL 25 MCG (1000 UT) PO TABS: 1000.0000 [IU] | ORAL_TABLET | Freq: Every day | ORAL | Status: AC

## 2011-05-13 MED ORDER — BUDESONIDE-FORMOTEROL FUMARATE 160-4.5 MCG/ACT IN AERO: 2.0000 | INHALATION_SPRAY | Freq: Two times a day (BID) | RESPIRATORY_TRACT | Status: DC

## 2011-05-13 MED ORDER — RANITIDINE HCL 300 MG PO TABS: 300.0000 mg | ORAL_TABLET | Freq: Every day | ORAL | Status: DC

## 2011-05-13 MED ORDER — CYCLOBENZAPRINE HCL 10 MG PO TABS: 10.0000 mg | ORAL_TABLET | Freq: Two times a day (BID) | ORAL | Status: DC | PRN

## 2011-05-13 MED ORDER — HYDROCHLOROTHIAZIDE 12.5 MG PO CAPS: 12.5000 mg | ORAL_CAPSULE | ORAL | Status: DC

## 2011-05-13 MED ORDER — LORATADINE 10 MG PO TABS: 10.0000 mg | ORAL_TABLET | Freq: Every day | ORAL | Status: DC | PRN

## 2011-05-13 MED ORDER — SAXAGLIPTIN-METFORMIN ER 5-1000 MG PO TB24: 1.0000 | ORAL_TABLET | Freq: Every day | ORAL | Status: DC

## 2011-05-13 MED ORDER — ALBUTEROL SULFATE HFA 108 (90 BASE) MCG/ACT IN AERS: 1.0000 | INHALATION_SPRAY | RESPIRATORY_TRACT | Status: DC | PRN

## 2011-05-13 MED ORDER — GLIMEPIRIDE 1 MG PO TABS: 1.0000 mg | ORAL_TABLET | Freq: Two times a day (BID) | ORAL | Status: DC

## 2011-05-13 NOTE — Assessment & Plan Note (Signed)
Continue with current prescription therapy as reflected on the Med list.  

## 2011-05-13 NOTE — Progress Notes (Signed)
Patient ID: Alexandra [name], female   DOB: [birth year], 46 y.o.   MRN: [id number]  Subjective:    Patient ID: Alexandra [name], female    DOB: Nov 22, 1965, 46 y.o.   MRN: [id number]  HPI   The patient presents for a follow-up of  chronic hypertension, chronic dyslipidemia, asthma,  type 2 diabetes controlled with medicines. C/o occ constipation - stool q2-3 d  BP Readings from Last 3 Encounters:  05/13/11 128/80  02/09/11 120/88  10/27/10 120/80   Wt Readings from Last 3 Encounters:  05/13/11 180 lb (81.647 kg)  02/09/11 182 lb (82.555 kg)  10/27/10 184 lb (83.462 kg)       Review of Systems  Constitutional: Negative for chills, activity change, appetite change, fatigue and unexpected weight change.  HENT: Negative for congestion, mouth sores and sinus pressure.   Eyes: Negative for visual disturbance.  Respiratory: Negative for cough and chest tightness.   Gastrointestinal: Negative for nausea and abdominal pain.  Genitourinary: Negative for frequency, difficulty urinating and vaginal pain.  Musculoskeletal: Negative for back pain and gait problem.  Skin: Negative for pallor and rash.  Neurological: Negative for dizziness, tremors, weakness, numbness and headaches.  Psychiatric/Behavioral: Negative for confusion and sleep disturbance.       Objective:   Physical Exam  Constitutional: She appears well-developed and well-nourished. No distress.  HENT:  Head: Normocephalic.  Right Ear: External ear normal.  Left Ear: External ear normal.  Nose: Nose normal.  Mouth/Throat: Oropharynx is clear and moist.  Eyes: Conjunctivae are normal. Pupils are equal, round, and reactive to light. Right eye exhibits no discharge. Left eye exhibits no discharge.  Neck: Normal range of motion. Neck supple. No JVD present. No tracheal deviation present. No thyromegaly present.  Cardiovascular: Normal rate, regular rhythm and normal heart sounds.   Pulmonary/Chest: No stridor. No respiratory  distress. She has no wheezes.  Abdominal: Soft. Bowel sounds are normal. She exhibits no distension and no mass. There is no tenderness. There is no rebound and no guarding.  Musculoskeletal: She exhibits no edema and no tenderness.  Lymphadenopathy:    She has no cervical adenopathy.  Neurological: She displays normal reflexes. No cranial nerve deficit. She exhibits normal muscle tone. Coordination normal.  Skin: No rash noted. No erythema.  Psychiatric: She has a normal mood and affect. Her behavior is normal. Judgment and thought content normal.    Lab Results  Component Value Date   WBC 8.3 03/03/2008   HGB 13.5 03/03/2008   HCT 39.4 03/03/2008   PLT 289 03/03/2008   GLUCOSE 156* 10/27/2010   CHOL 199 08/06/2009   TRIG 93.0 08/06/2009   HDL 52.70 08/06/2009   LDLDIRECT 122.0 03/03/2008   LDLCALC 128* 08/06/2009   ALT 21 12/07/2009   AST 22 12/07/2009   NA 140 10/27/2010   K 4.8 10/27/2010   CL 102 10/27/2010   CREATININE 0.7 10/27/2010   BUN 11 10/27/2010   CO2 28 10/27/2010   TSH 2.75 04/03/2009   HGBA1C 7.6* 10/27/2010   MICROALBUR 0.4 12/07/2009         Assessment & Plan:

## 2011-05-13 NOTE — Assessment & Plan Note (Signed)
Continue with current prescription therapy as reflected on the Med list. prn 

## 2011-05-17 ENCOUNTER — Telehealth: Payer: Self-pay | Admitting: Internal Medicine

## 2011-05-17 NOTE — Telephone Encounter (Signed)
Misty Stanley, please, inform patient that all labs are normal except for elev sugar control - we need to improve control and start insulin if not better Keep ROV

## 2011-05-19 NOTE — Telephone Encounter (Signed)
Left message on voicemail to return my call.  

## 2011-09-04 ENCOUNTER — Other Ambulatory Visit: Payer: Self-pay | Admitting: Internal Medicine

## 2011-09-11 ENCOUNTER — Other Ambulatory Visit: Payer: Self-pay | Admitting: Internal Medicine

## 2011-09-16 ENCOUNTER — Ambulatory Visit (INDEPENDENT_AMBULATORY_CARE_PROVIDER_SITE_OTHER): Payer: Commercial Managed Care - PPO | Admitting: Internal Medicine

## 2011-09-16 ENCOUNTER — Other Ambulatory Visit (INDEPENDENT_AMBULATORY_CARE_PROVIDER_SITE_OTHER): Payer: Commercial Managed Care - PPO

## 2011-09-16 ENCOUNTER — Encounter: Payer: Self-pay | Admitting: Internal Medicine

## 2011-09-16 ENCOUNTER — Telehealth: Payer: Self-pay | Admitting: Internal Medicine

## 2011-09-16 VITALS — BP 112/80 | HR 80 | Temp 98.0°F | Resp 16 | Wt 180.0 lb

## 2011-09-16 DIAGNOSIS — I10 Essential (primary) hypertension: Secondary | ICD-10-CM

## 2011-09-16 DIAGNOSIS — E538 Deficiency of other specified B group vitamins: Secondary | ICD-10-CM

## 2011-09-16 DIAGNOSIS — M545 Low back pain, unspecified: Secondary | ICD-10-CM

## 2011-09-16 DIAGNOSIS — J45909 Unspecified asthma, uncomplicated: Secondary | ICD-10-CM

## 2011-09-16 DIAGNOSIS — E1165 Type 2 diabetes mellitus with hyperglycemia: Secondary | ICD-10-CM

## 2011-09-16 DIAGNOSIS — E119 Type 2 diabetes mellitus without complications: Secondary | ICD-10-CM

## 2011-09-16 LAB — CBC WITH DIFFERENTIAL/PLATELET
Basophils Relative: 0.4 % (ref 0.0–3.0)
Eosinophils Absolute: 0.2 10*3/uL (ref 0.0–0.7)
HCT: 37.5 % (ref 36.0–46.0)
Hemoglobin: 12.1 g/dL (ref 12.0–15.0)
Lymphocytes Relative: 35.6 % (ref 12.0–46.0)
Lymphs Abs: 3 10*3/uL (ref 0.7–4.0)
MCHC: 32.4 g/dL (ref 30.0–36.0)
Monocytes Relative: 8 % (ref 3.0–12.0)
Neutro Abs: 4.5 10*3/uL (ref 1.4–7.7)
RBC: 3.87 Mil/uL (ref 3.87–5.11)

## 2011-09-16 LAB — URINALYSIS
Bilirubin Urine: NEGATIVE
Ketones, ur: NEGATIVE
Leukocytes, UA: NEGATIVE
Urobilinogen, UA: 0.2 (ref 0.0–1.0)

## 2011-09-16 LAB — BASIC METABOLIC PANEL
BUN: 14 mg/dL (ref 6–23)
Calcium: 9.6 mg/dL (ref 8.4–10.5)
Creatinine, Ser: 0.7 mg/dL (ref 0.4–1.2)
GFR: 116.13 mL/min (ref >60.00)

## 2011-09-16 LAB — LIPID PANEL
HDL: 44.3 mg/dL (ref >39.00)
Total CHOL/HDL Ratio: 4
VLDL: 25.2 mg/dL (ref 0.0–40.0)

## 2011-09-16 LAB — TSH: TSH: 2.61 u[IU]/mL (ref 0.35–5.50)

## 2011-09-16 LAB — HEPATIC FUNCTION PANEL: Total Bilirubin: 0.3 mg/dL (ref 0.3–1.2)

## 2011-09-16 MED ORDER — SAXAGLIPTIN-METFORMIN ER 5-1000 MG PO TB24: 1.0000 | ORAL_TABLET | Freq: Every day | ORAL | Status: DC

## 2011-09-16 MED ORDER — NORETHINDRONE 0.35 MG PO TABS: 1.0000 | ORAL_TABLET | Freq: Every day | ORAL | Status: DC

## 2011-09-16 MED ORDER — BUDESONIDE-FORMOTEROL FUMARATE 160-4.5 MCG/ACT IN AERO: 2.0000 | INHALATION_SPRAY | Freq: Two times a day (BID) | RESPIRATORY_TRACT | Status: DC

## 2011-09-16 MED ORDER — CYCLOBENZAPRINE HCL 10 MG PO TABS: 10.0000 mg | ORAL_TABLET | Freq: Two times a day (BID) | ORAL | Status: DC | PRN

## 2011-09-16 MED ORDER — IBUPROFEN 600 MG PO TABS: 600.0000 mg | ORAL_TABLET | Freq: Four times a day (QID) | ORAL | Status: DC | PRN

## 2011-09-16 MED ORDER — GLIMEPIRIDE 1 MG PO TABS: 1.0000 mg | ORAL_TABLET | Freq: Two times a day (BID) | ORAL | Status: DC

## 2011-09-16 MED ORDER — ALBUTEROL SULFATE HFA 108 (90 BASE) MCG/ACT IN AERS: 1.0000 | INHALATION_SPRAY | RESPIRATORY_TRACT | Status: DC | PRN

## 2011-09-16 MED ORDER — HYDROCHLOROTHIAZIDE 12.5 MG PO CAPS: 12.5000 mg | ORAL_CAPSULE | ORAL | Status: DC

## 2011-09-16 MED ORDER — LORATADINE 10 MG PO TABS: 10.0000 mg | ORAL_TABLET | Freq: Every day | ORAL | Status: DC | PRN

## 2011-09-16 MED ORDER — PROMETHAZINE HCL 25 MG PO TABS: 25.0000 mg | ORAL_TABLET | Freq: Two times a day (BID) | ORAL | Status: DC | PRN

## 2011-09-16 MED ORDER — RANITIDINE HCL 300 MG PO TABS: 300.0000 mg | ORAL_TABLET | Freq: Every day | ORAL | Status: DC

## 2011-09-16 NOTE — Assessment & Plan Note (Signed)
Continue with current prescription therapy as reflected on the Med list. Labs today  

## 2011-09-16 NOTE — Progress Notes (Signed)
Patient ID: Alexandra [name], female   DOB: Sep 18, 1965, 46 y.o.   MRN: [id number] Patient ID: Alexandra [name], female   DOB: Apr 06, 1965, 46 y.o.   MRN: [id number]  Subjective:    Patient ID: Alexandra [name], female    DOB: February 11, 1966, 46 y.o.   MRN: [id number]  HPI   The patient presents for a follow-up of  chronic hypertension, chronic dyslipidemia, asthma,  type 2 diabetes controlled with medicines. C/o occ constipation - stool q2-3 d  BP Readings from Last 3 Encounters:  09/16/11 112/80  05/13/11 128/80  02/09/11 120/88   Wt Readings from Last 3 Encounters:  09/16/11 180 lb (81.647 kg)  05/13/11 180 lb (81.647 kg)  02/09/11 182 lb (82.555 kg)       Review of Systems  Constitutional: Negative for chills, activity change, appetite change, fatigue and unexpected weight change.  HENT: Negative for congestion, mouth sores and sinus pressure.   Eyes: Negative for visual disturbance.  Respiratory: Negative for cough and chest tightness.   Gastrointestinal: Negative for nausea and abdominal pain.  Genitourinary: Negative for frequency, difficulty urinating and vaginal pain.  Musculoskeletal: Negative for back pain and gait problem.  Skin: Negative for pallor and rash.  Neurological: Negative for dizziness, tremors, weakness, numbness and headaches.  Psychiatric/Behavioral: Negative for confusion and disturbed wake/sleep cycle.       Objective:   Physical Exam  Constitutional: She appears well-developed and well-nourished. No distress.  HENT:  Head: Normocephalic.  Right Ear: External ear normal.  Left Ear: External ear normal.  Nose: Nose normal.  Mouth/Throat: Oropharynx is clear and moist.  Eyes: Conjunctivae are normal. Pupils are equal, round, and reactive to light. Right eye exhibits no discharge. Left eye exhibits no discharge.  Neck: Normal range of motion. Neck supple. No JVD present. No tracheal deviation present. No thyromegaly present.  Cardiovascular: Normal rate,  regular rhythm and normal heart sounds.   Pulmonary/Chest: No stridor. No respiratory distress. She has no wheezes.  Abdominal: Soft. Bowel sounds are normal. She exhibits no distension and no mass. There is no tenderness. There is no rebound and no guarding.  Musculoskeletal: She exhibits no edema and no tenderness.  Lymphadenopathy:    She has no cervical adenopathy.  Neurological: She displays normal reflexes. No cranial nerve deficit. She exhibits normal muscle tone. Coordination normal.  Skin: No rash noted. No erythema.  Psychiatric: She has a normal mood and affect. Her behavior is normal. Judgment and thought content normal.    Lab Results  Component Value Date   WBC 8.3 03/03/2008   HGB 13.5 03/03/2008   HCT 39.4 03/03/2008   PLT 289 03/03/2008   GLUCOSE 116* 05/13/2011   CHOL 199 08/06/2009   TRIG 93.0 08/06/2009   HDL 52.70 08/06/2009   LDLDIRECT 122.0 03/03/2008   LDLCALC 128* 08/06/2009   ALT 21 12/07/2009   AST 22 12/07/2009   NA 138 05/13/2011   K 4.1 05/13/2011   CL 102 05/13/2011   CREATININE 0.6 05/13/2011   BUN 11 05/13/2011   CO2 29 05/13/2011   TSH 2.75 04/03/2009   HGBA1C 8.5* 05/13/2011   MICROALBUR 0.4 12/07/2009         Assessment & Plan:

## 2011-09-16 NOTE — Assessment & Plan Note (Signed)
Continue with current prescription therapy as reflected on the Med list.  

## 2011-09-16 NOTE — Telephone Encounter (Signed)
Alexandra [name], please, inform patient that all labs are normal except for poor sugar control Needs better diet. Is it ok to sch endocr consult?  Thx

## 2011-09-19 NOTE — Telephone Encounter (Signed)
Pt informed. Yes, she is ok with Endo consult

## 2011-09-20 NOTE — Telephone Encounter (Signed)
Will ref to Endo Done Thx

## 2011-09-21 ENCOUNTER — Ambulatory Visit (INDEPENDENT_AMBULATORY_CARE_PROVIDER_SITE_OTHER): Payer: Commercial Managed Care - PPO | Admitting: Endocrinology

## 2011-09-21 ENCOUNTER — Encounter: Payer: Self-pay | Admitting: Endocrinology

## 2011-09-21 VITALS — BP 110/78 | HR 102 | Temp 98.0°F | Wt 182.0 lb

## 2011-09-21 DIAGNOSIS — E119 Type 2 diabetes mellitus without complications: Secondary | ICD-10-CM

## 2011-09-21 MED ORDER — SAXAGLIPTIN-METFORMIN ER 2.5-1000 MG PO TB24: ORAL_TABLET | ORAL | Status: DC

## 2011-09-21 MED ORDER — BROMOCRIPTINE MESYLATE 2.5 MG PO TABS: 2.5000 mg | ORAL_TABLET | Freq: Every day | ORAL | Status: DC

## 2011-09-21 NOTE — Patient Instructions (Addendum)
good diet and exercise habits significanly improve the control of your diabetes.  please let me know if you wish to be referred to a dietician.  high blood sugar is very risky to your health.  you should see an eye doctor every year. controlling your blood pressure and cholesterol drastically reduces the damage diabetes does to your body.  this also applies to quitting smoking.  please discuss these with your doctor.  you should take an aspirin every day, unless you have been advised by a doctor not to. check your blood sugar once a day.  vary the time of day when you check, between before the 3 meals, and at bedtime.  also check if you have symptoms of your blood sugar being too high or too low.  please keep a record of the readings and bring it to your next appointment here.  please call us sooner if your blood sugar goes below 70, or if it stays over 200.  Increase the komblglyze to 2.07/998, 2 pills each morning.  Please add "bromocriptine," to help your blood sugar. It has possible side effects of nausea and dizziness.  These go away with time.  You can avoid these by taking it at bedtime, and by taking just take 1/2 pill for the first week.   Please come back for a follow-up appointment in 3 months.

## 2011-09-21 NOTE — Progress Notes (Signed)
Subjective:    Patient ID: Alexandra [name], female    DOB: 04-05-1965, 46 y.o.   MRN: [id number]  HPI pt states 10 years h/o dm.  She is unaware of any chronic complications.  she has never been on insulin.  pt says her diet and exercise are "fair."  Pt states few mos of numbness of the RLE, and assoc pain.  She attributes this to radiculopathy. Past Medical History  Diagnosis Date  . Diabetes mellitus   . Low back pain   . Osteoarthritis   . CTS (carpal tunnel syndrome)   . Glaucoma     ?  . Polyarthralgia   . Asthma   . Hypertension   . Vitamin B12 deficiency   . ALLERGIC RHINITIS     No past surgical history on file.  History   Social History  . Marital Status: Single    Spouse Name: N/A    Number of Children: N/A  . Years of Education: N/A   Occupational History  . 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 Smoking status: Never Smoker   . Smokeless tobacco: Not on file  . Alcohol Use: No  . Drug Use: No  . Sexually Active: Not Currently   Other Topics Concern  . Not on file   Social History Narrative  . No narrative on file    Current Outpatient Prescriptions on File Prior to Visit  Medication Sig Dispense Refill  . albuterol (VENTOLIN HFA) 108 (90 BASE) MCG/ACT inhaler Inhale 1 puff into the lungs every 4 (four) hours as needed.  3 Inhaler  3  . budesonide-formoterol (SYMBICORT) 160-4.5 MCG/ACT inhaler Inhale 2 puffs into the lungs 2 (two) times daily.  3 Inhaler  3  . Cholecalciferol 1000 UNITS tablet Take 1 tablet (1,000 Units total) by mouth daily.  100 tablet  3  . cyanocobalamin 1000 MCG tablet Take 100 mcg by mouth daily.        . cyclobenzaprine (FLEXERIL) 10 MG tablet Take 1 tablet (10 mg total) by mouth 2 (two) times daily as needed.  180 tablet  1  . fluticasone (FLONASE) 50 MCG/ACT nasal spray 2 sprays by Nasal route daily.        Marland Kitchen glimepiride (AMARYL) 1 MG tablet Take 1 tablet (1 mg total) by mouth 2 (two) times  daily.  180 tablet  3  . hydrochlorothiazide (MICROZIDE) 12.5 MG capsule Take 1 capsule (12.5 mg total) by mouth every morning.  90 capsule  3  . ibuprofen (ADVIL,MOTRIN) 600 MG tablet Take 1 tablet (600 mg total) by mouth every 6 (six) hours as needed for pain.  90 tablet  3  . levonorgestrel-ethinyl estradiol (AVIANE,ALESSE,LESSINA) 0.1-20 MG-MCG tablet Take 1 tablet by mouth as directed.  28 tablet  11  . loratadine (CLARITIN) 10 MG tablet Take 1 tablet (10 mg total) by mouth daily as needed. for allergies  90 tablet  3  . norethindrone (MICRONOR,CAMILA,ERRIN) 0.35 MG tablet Take 1 tablet (0.35 mg total) by mouth daily.  3 Package  3  . promethazine (PHENERGAN) 25 MG tablet Take 1 tablet (25 mg total) by mouth 2 (two) times daily as needed for nausea.  60 tablet  1  . ranitidine (ZANTAC) 300 MG tablet Take 1 tablet (300 mg total) by mouth daily.  90 tablet  3  . Saxagliptin-Metformin (KOMBIGLYZE XR) 07-998 MG TB24 Take 1 tablet by mouth daily.  180 tablet  3  .  terbinafine (LAMISIL) 1 % cream Apply topically as needed.          Allergies  Allergen Reactions  . Benazepril Hcl     REACTION: cough  . Tramadol Hcl     REACTION: itching    Family History  Problem Relation Age of Onset  . Hypertension    . Diabetes Mother   . Kidney disease Mother 58    ESRD on HD  . Heart disease Mother   . Cancer Father     head and neck  . Stroke Brother 45    CVAx2 on crack    BP 110/78  Pulse 102  Temp 98 F (36.7 C) (Oral)  Wt 182 lb (82.555 kg)  SpO2 99%  Review of Systems denies weight loss, blurry vision, chest pain, sob, n/v, excessive diaphoresis, memory loss, depression, hypoglycemia, rhinorrhea, and easy bruising.  She has headache, muscle cramps, and urinary frequency.  She has no menses since she has been on oc's.      Objective:   Physical Exam VS: see vs page GEN: no distress HEAD: head: no deformity eyes: no periorbital swelling, no proptosis external nose and ears are  normal mouth: no lesion seen NECK: supple, thyroid is not enlarged CHEST WALL: no deformity LUNGS:  Clear to auscultation. CV: reg rate and rhythm, no murmur ABD: abdomen is soft, nontender.  no hepatosplenomegaly.  not distended.  no hernia.   MUSCULOSKELETAL: muscle bulk and strength are grossly normal.  no obvious joint swelling.  gait is normal and steady. EXTEMITIES: no deformity.  no ulcer on the feet.  feet are of normal color and temp.  no edema PULSES: dorsalis pedis intact bilat.  no carotid bruit NEURO:  cn 2-12 grossly intact.   readily moves all 4's.  sensation is intact to touch on the feet, but decreased from normal on the right foot. SKIN:  Normal texture and temperature.  No rash or suspicious lesion is visible.   NODES:  None palpable at the neck PSYCH: alert, oriented x3.  Does not appear anxious nor depressed. Lab Results  Component Value Date   HGBA1C 8.6* 09/16/2011      Assessment & Plan:  DM.  needs increased rx RLE numbness, due to radiculopathy rather than the DM. OA.  This limits exercise rx of DM

## 2011-10-04 ENCOUNTER — Other Ambulatory Visit: Payer: Self-pay | Admitting: Obstetrics and Gynecology

## 2011-10-04 DIAGNOSIS — Z1231 Encounter for screening mammogram for malignant neoplasm of breast: Secondary | ICD-10-CM

## 2011-10-10 NOTE — Telephone Encounter (Signed)
x

## 2011-11-16 ENCOUNTER — Ambulatory Visit: Payer: Commercial Managed Care - PPO

## 2011-11-16 ENCOUNTER — Ambulatory Visit
Admission: RE | Admit: 2011-11-16 | Discharge: 2011-11-16 | Disposition: A | Discharge status: Home / Self Care | Payer: Commercial Managed Care - PPO | Source: Ambulatory Visit | Attending: Obstetrics and Gynecology | Admitting: Obstetrics and Gynecology

## 2011-11-16 DIAGNOSIS — Z1231 Encounter for screening mammogram for malignant neoplasm of breast: Secondary | ICD-10-CM

## 2011-11-29 ENCOUNTER — Ambulatory Visit (INDEPENDENT_AMBULATORY_CARE_PROVIDER_SITE_OTHER): Payer: Commercial Managed Care - PPO | Admitting: Obstetrics and Gynecology

## 2011-11-29 ENCOUNTER — Encounter: Payer: Self-pay | Admitting: Obstetrics and Gynecology

## 2011-11-29 VITALS — BP 122/70 | HR 72 | Resp 16 | Ht 69.0 in | Wt 175.0 lb

## 2011-11-29 DIAGNOSIS — Z01419 Encounter for gynecological examination (general) (routine) without abnormal findings: Secondary | ICD-10-CM

## 2011-11-29 MED ORDER — NORETHINDRONE 0.35 MG PO TABS: 1.0000 | ORAL_TABLET | Freq: Every day | ORAL | Status: DC

## 2011-11-29 NOTE — Progress Notes (Signed)
Contraception BC Pill Last pap 11/02/2010 Last Mammo 11/16/2011 WNL Last Colonoscopy None Last Dexa Scan None Primary MD [name] Abuse at Home None  Wants to cont Alexandra [name].  No cycles since starting it.  H/o fibroids  Blood pressure 122/70, pulse 72, resp. rate 16, height 5\' 9"  (1.753 m), weight 175 lb (79.379 kg).  ROS: noncontributory  Physical Examination: General appearance - alert, well appearing, and in no distress Neck - supple, no significant adenopathy Chest - clear to auscultation, no wheezes, rales or rhonchi, symmetric air entry Heart - normal rate and regular rhythm Abdomen - soft, nontender, nondistended, no masses or organomegaly Breasts - breasts appear normal, no suspicious masses, no skin or nipple changes or axillary nodes Pelvic - normal external genitalia, vulva, vagina, cervix, uterus and adnexa Back exam - no CVAT Extremities - no edema, redness or tenderness in the calves or thighs  A/P Declined referral for colononscopy until 46yo Rx Alexandra [name] AEX

## 2011-12-21 ENCOUNTER — Ambulatory Visit (INDEPENDENT_AMBULATORY_CARE_PROVIDER_SITE_OTHER): Payer: Commercial Managed Care - PPO | Admitting: Endocrinology

## 2011-12-21 ENCOUNTER — Encounter: Payer: Self-pay | Admitting: Endocrinology

## 2011-12-21 ENCOUNTER — Other Ambulatory Visit: Payer: Self-pay | Admitting: General Practice

## 2011-12-21 VITALS — BP 120/78 | HR 82 | Temp 97.8°F | Resp 16 | Wt 178.3 lb

## 2011-12-21 DIAGNOSIS — E119 Type 2 diabetes mellitus without complications: Secondary | ICD-10-CM

## 2011-12-21 LAB — HEMOGLOBIN A1C
Hgb A1c MFr Bld: 6.6 % — ABNORMAL HIGH (ref <5.7)
Mean Plasma Glucose: 143 mg/dL — ABNORMAL HIGH (ref <117)

## 2011-12-21 MED ORDER — BROMOCRIPTINE MESYLATE 2.5 MG PO TABS: 2.5000 mg | ORAL_TABLET | Freq: Every day | ORAL | Status: DC

## 2011-12-21 NOTE — Patient Instructions (Addendum)
check your blood sugar once a day.  vary the time of day when you check, between before the 3 meals, and at bedtime.  also check if you have symptoms of your blood sugar being too high or too low.  please keep a record of the readings and bring it to your next appointment here.  please call us sooner if your blood sugar goes below 70, or if it stays over 200.  blood tests are being requested for you today.  You will be contacted with results.   Please come back for a follow-up appointment in 3 months.  We'll add actos if the sugar is no better.

## 2011-12-21 NOTE — Progress Notes (Signed)
  Subjective:    Patient ID: Alexandra [name], female    DOB: [birth year], 46 y.o.   MRN: [id number]  HPI pt return for f/u of type 2 DM (dx'ed 2003; no chronic complications). She could only tolerate 1/2 tab of the parlodel so far, due to nausea.  no cbg record, but states cbg's are in the mid-100's.     Review of Systems denies hypoglycemia.    Objective:   Physical Exam VITAL SIGNS:  See vs page GENERAL: no distress PSYCH: Alert and oriented x 3.  Does not appear anxious nor depressed.     Assessment & Plan:

## 2012-01-18 ENCOUNTER — Encounter: Payer: Self-pay | Admitting: Internal Medicine

## 2012-01-18 ENCOUNTER — Ambulatory Visit (INDEPENDENT_AMBULATORY_CARE_PROVIDER_SITE_OTHER): Payer: Commercial Managed Care - PPO | Admitting: Internal Medicine

## 2012-01-18 VITALS — BP 118/88 | HR 84 | Temp 97.9°F | Resp 16 | Wt 178.0 lb

## 2012-01-18 DIAGNOSIS — E119 Type 2 diabetes mellitus without complications: Secondary | ICD-10-CM

## 2012-01-18 DIAGNOSIS — M2550 Pain in unspecified joint: Secondary | ICD-10-CM

## 2012-01-18 DIAGNOSIS — I10 Essential (primary) hypertension: Secondary | ICD-10-CM

## 2012-01-18 DIAGNOSIS — M545 Low back pain, unspecified: Secondary | ICD-10-CM

## 2012-01-18 DIAGNOSIS — E538 Deficiency of other specified B group vitamins: Secondary | ICD-10-CM

## 2012-01-18 DIAGNOSIS — Z23 Encounter for immunization: Secondary | ICD-10-CM

## 2012-01-18 MED ORDER — IBUPROFEN 600 MG PO TABS: 600.0000 mg | ORAL_TABLET | Freq: Four times a day (QID) | ORAL | Status: DC | PRN

## 2012-01-18 MED ORDER — HYDROCHLOROTHIAZIDE 12.5 MG PO CAPS: 12.5000 mg | ORAL_CAPSULE | ORAL | Status: DC

## 2012-01-18 MED ORDER — PROMETHAZINE HCL 25 MG PO TABS: 25.0000 mg | ORAL_TABLET | Freq: Two times a day (BID) | ORAL | Status: DC | PRN

## 2012-01-18 MED ORDER — SAXAGLIPTIN-METFORMIN ER 2.5-1000 MG PO TB24: ORAL_TABLET | ORAL | Status: DC

## 2012-01-18 MED ORDER — LORATADINE 10 MG PO TABS: 10.0000 mg | ORAL_TABLET | Freq: Every day | ORAL | Status: DC | PRN

## 2012-01-18 MED ORDER — GLIMEPIRIDE 1 MG PO TABS: 1.0000 mg | ORAL_TABLET | Freq: Two times a day (BID) | ORAL | Status: DC

## 2012-01-18 NOTE — Assessment & Plan Note (Signed)
Better on current Rx 

## 2012-01-18 NOTE — Assessment & Plan Note (Signed)
Continue with current prescription therapy as reflected on the Med list.  

## 2012-01-18 NOTE — Progress Notes (Signed)
   Subjective:    Patient ID: Alexandra [name], female    DOB: Aug 07, 1965, 46 y.o.   MRN: [id number]  HPI   The patient presents for a follow-up of  chronic hypertension, chronic dyslipidemia, asthma,  type 2 diabetes controlled with medicines. C/o occ constipation - stool q2-3 d  She did see Dr Everardo All. She is c/o hand arthritis problems...  BP Readings from Last 3 Encounters:  01/18/12 118/88  12/21/11 120/78  11/29/11 122/70   Wt Readings from Last 3 Encounters:  01/18/12 178 lb (80.74 kg)  12/21/11 178 lb 5 oz (80.882 kg)  11/29/11 175 lb (79.379 kg)       Review of Systems  Constitutional: Negative for chills, activity change, appetite change, fatigue and unexpected weight change.  HENT: Negative for congestion, mouth sores and sinus pressure.   Eyes: Negative for visual disturbance.  Respiratory: Negative for cough and chest tightness.   Gastrointestinal: Negative for nausea and abdominal pain.  Genitourinary: Negative for frequency, difficulty urinating and vaginal pain.  Musculoskeletal: Negative for back pain and gait problem.  Skin: Negative for pallor and rash.  Neurological: Negative for dizziness, tremors, weakness, numbness and headaches.  Psychiatric/Behavioral: Negative for confusion and disturbed wake/sleep cycle.       Objective:   Physical Exam  Constitutional: She appears well-developed and well-nourished. No distress.  HENT:  Head: Normocephalic.  Right Ear: External ear normal.  Left Ear: External ear normal.  Nose: Nose normal.  Mouth/Throat: Oropharynx is clear and moist.  Eyes: Conjunctivae normal are normal. Pupils are equal, round, and reactive to light. Right eye exhibits no discharge. Left eye exhibits no discharge.  Neck: Normal range of motion. Neck supple. No JVD present. No tracheal deviation present. No thyromegaly present.  Cardiovascular: Normal rate, regular rhythm and normal heart sounds.   Pulmonary/Chest: No stridor. No  respiratory distress. She has no wheezes.  Abdominal: Soft. Bowel sounds are normal. She exhibits no distension and no mass. There is no tenderness. There is no rebound and no guarding.  Musculoskeletal: She exhibits no edema and no tenderness.  Lymphadenopathy:    She has no cervical adenopathy.  Neurological: She displays normal reflexes. No cranial nerve deficit. She exhibits normal muscle tone. Coordination normal.  Skin: No rash noted. No erythema.  Psychiatric: She has a normal mood and affect. Her behavior is normal. Judgment and thought content normal.    Lab Results  Component Value Date   WBC 8.4 09/16/2011   HGB 12.1 09/16/2011   HCT 37.5 09/16/2011   PLT 311.0 09/16/2011   GLUCOSE 130* 09/16/2011   CHOL 192 09/16/2011   TRIG 126.0 09/16/2011   HDL 44.30 09/16/2011   LDLDIRECT 122.0 03/03/2008   LDLCALC 123* 09/16/2011   ALT 23 09/16/2011   AST 26 09/16/2011   NA 137 09/16/2011   K 4.2 09/16/2011   CL 100 09/16/2011   CREATININE 0.7 09/16/2011   BUN 14 09/16/2011   CO2 28 09/16/2011   TSH 2.61 09/16/2011   HGBA1C 6.6* 12/21/2011   MICROALBUR 0.4 12/07/2009         Assessment & Plan:

## 2012-01-18 NOTE — Assessment & Plan Note (Signed)
R/o inflamatory OA. She says she was dx'd w/RA. Labs

## 2012-03-27 ENCOUNTER — Ambulatory Visit (INDEPENDENT_AMBULATORY_CARE_PROVIDER_SITE_OTHER): Payer: Commercial Managed Care - PPO | Admitting: Endocrinology

## 2012-03-27 ENCOUNTER — Encounter: Payer: Self-pay | Admitting: Endocrinology

## 2012-03-27 VITALS — BP 124/76 | HR 100 | Wt 176.0 lb

## 2012-03-27 DIAGNOSIS — E119 Type 2 diabetes mellitus without complications: Secondary | ICD-10-CM

## 2012-03-27 MED ORDER — GLIMEPIRIDE 1 MG PO TABS: 1.0000 mg | ORAL_TABLET | Freq: Every day | ORAL | Status: DC

## 2012-03-27 NOTE — Patient Instructions (Addendum)
check your blood sugar once a day.  vary the time of day when you check, between before the 3 meals, and at bedtime.  also check if you have symptoms of your blood sugar being too high or too low.  please keep a record of the readings and bring it to your next appointment here.  please call us sooner if your blood sugar goes below 70, or if it stays over 200.  Please have your diabetes blood test checked when you see dr plotnikov soon.  Please come back for a follow-up appointment in 4 months.   Please reduce the glimepiride to 1 pill in the morning only.

## 2012-03-27 NOTE — Progress Notes (Signed)
Subjective:    Patient ID: Alexandra [name], female    DOB: November 26, 1965, 47 y.o.   MRN: [id number]  HPI pt return for f/u of type 2 DM (dx'ed 2003; no chronic complications; she is on 4 oral agents).  she brings a record of her cbg's which i have reviewed today.  It varies from 52-200, but most are approx 100. There is no trend throughout the day.   Past Medical History  Diagnosis Date  . Diabetes mellitus   . Low back pain   . Osteoarthritis   . CTS (carpal tunnel syndrome)   . Glaucoma     ?  . Polyarthralgia   . Asthma   . Hypertension   . Vitamin B12 deficiency   . ALLERGIC RHINITIS     No past surgical history on file.  History   Social History  . Marital Status: Single    Spouse Name: N/A    Number of Children: N/A  . Years of Education: N/A   Occupational History  . 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 Smoking status: Never Smoker   . Smokeless tobacco: Not on file  . Alcohol Use: No  . Drug Use: No  . Sexually Active: Not Currently   Other Topics Concern  . Not on file   Social History Narrative  . No narrative on file    Current Outpatient Prescriptions on File Prior to Visit  Medication Sig Dispense Refill  . albuterol (VENTOLIN HFA) 108 (90 BASE) MCG/ACT inhaler Inhale 1 puff into the lungs every 4 (four) hours as needed.  3 Inhaler  3  . bromocriptine (PARLODEL) 2.5 MG tablet Take 1 tablet (2.5 mg total) by mouth daily.  90 tablet  3  . budesonide-formoterol (SYMBICORT) 160-4.5 MCG/ACT inhaler Inhale 2 puffs into the lungs 2 (two) times daily.  3 Inhaler  3  . Cholecalciferol 1000 UNITS tablet Take 1 tablet (1,000 Units total) by mouth daily.  100 tablet  3  . cyclobenzaprine (FLEXERIL) 10 MG tablet Take 1 tablet (10 mg total) by mouth 2 (two) times daily as needed.  180 tablet  1  . fluticasone (FLONASE) 50 MCG/ACT nasal spray 2 sprays by Nasal route daily.        Marland Kitchen glimepiride (AMARYL) 1 MG tablet Take 1 tablet (1 mg  total) by mouth daily before breakfast.  90 tablet  3  . hydrochlorothiazide (MICROZIDE) 12.5 MG capsule Take 1 capsule (12.5 mg total) by mouth every morning.  90 capsule  3  . ibuprofen (ADVIL,MOTRIN) 600 MG tablet Take 1 tablet (600 mg total) by mouth every 6 (six) hours as needed for pain.  90 tablet  3  . levonorgestrel-ethinyl estradiol (AVIANE,ALESSE,LESSINA) 0.1-20 MG-MCG tablet Take 1 tablet by mouth as directed.  28 tablet  11  . loratadine (CLARITIN) 10 MG tablet Take 1 tablet (10 mg total) by mouth daily as needed. for allergies  90 tablet  3  . norethindrone (MICRONOR,CAMILA,ERRIN) 0.35 MG tablet Take 1 tablet (0.35 mg total) by mouth daily.  3 Package  3  . promethazine (PHENERGAN) 25 MG tablet Take 1 tablet (25 mg total) by mouth 2 (two) times daily as needed for nausea.  60 tablet  1  . ranitidine (ZANTAC) 300 MG tablet Take 1 tablet (300 mg total) by mouth daily.  90 tablet  3  . Saxagliptin-Metformin (KOMBIGLYZE XR) 2.07-998 MG TB24 2 tabs each morning  180 tablet  3  . terbinafine (LAMISIL) 1 % cream Apply topically as needed.          Allergies  Allergen Reactions  . Benazepril Hcl     REACTION: cough  . Tramadol Hcl     REACTION: itching    Family History  Problem Relation Age of Onset  . Hypertension    . Diabetes Mother   . Kidney disease Mother 61    ESRD on HD  . Heart disease Mother   . Cancer Father     head and neck  . Stroke Brother 45    CVAx2 on crack    BP 124/76  Pulse 100  Wt 176 lb (79.833 kg)  SpO2 98%   Review of Systems Denies LOC    Objective:   Physical Exam EXTEMITIES: no deformity.  no ulcer on the feet.  feet are of normal color and temp.  no edema PULSES: dorsalis pedis intact bilat.  no carotid bruit NEURO:  cn 2-12 grossly intact.   readily moves all 4's.  sensation is intact to touch on the feet, but decreased from normal on the right foot.      Assessment & Plan:  DM, apparently well-controlled

## 2012-05-21 ENCOUNTER — Encounter: Payer: Self-pay | Admitting: Internal Medicine

## 2012-05-21 ENCOUNTER — Ambulatory Visit (INDEPENDENT_AMBULATORY_CARE_PROVIDER_SITE_OTHER): Payer: Commercial Managed Care - PPO | Admitting: Internal Medicine

## 2012-05-21 ENCOUNTER — Other Ambulatory Visit (INDEPENDENT_AMBULATORY_CARE_PROVIDER_SITE_OTHER): Payer: Commercial Managed Care - PPO

## 2012-05-21 VITALS — BP 118/90 | HR 80 | Temp 97.9°F | Resp 16 | Wt 177.0 lb

## 2012-05-21 DIAGNOSIS — E538 Deficiency of other specified B group vitamins: Secondary | ICD-10-CM

## 2012-05-21 DIAGNOSIS — I10 Essential (primary) hypertension: Secondary | ICD-10-CM

## 2012-05-21 DIAGNOSIS — G4700 Insomnia, unspecified: Secondary | ICD-10-CM

## 2012-05-21 DIAGNOSIS — M545 Low back pain, unspecified: Secondary | ICD-10-CM

## 2012-05-21 DIAGNOSIS — J45909 Unspecified asthma, uncomplicated: Secondary | ICD-10-CM

## 2012-05-21 DIAGNOSIS — E119 Type 2 diabetes mellitus without complications: Secondary | ICD-10-CM

## 2012-05-21 DIAGNOSIS — E876 Hypokalemia: Secondary | ICD-10-CM

## 2012-05-21 DIAGNOSIS — M2550 Pain in unspecified joint: Secondary | ICD-10-CM

## 2012-05-21 LAB — URIC ACID: Uric Acid, Serum: 5.4 mg/dL (ref 2.4–7.0)

## 2012-05-21 LAB — HEMOGLOBIN A1C: Hgb A1c MFr Bld: 7 % — ABNORMAL HIGH (ref 4.6–6.5)

## 2012-05-21 MED ORDER — RANITIDINE HCL 300 MG PO TABS: 300.0000 mg | ORAL_TABLET | Freq: Every day | ORAL | Status: DC

## 2012-05-21 MED ORDER — LORATADINE 10 MG PO TABS: 10.0000 mg | ORAL_TABLET | Freq: Every day | ORAL | Status: DC | PRN

## 2012-05-21 MED ORDER — HYDROCHLOROTHIAZIDE 12.5 MG PO CAPS: 12.5000 mg | ORAL_CAPSULE | ORAL | Status: DC

## 2012-05-21 MED ORDER — CYCLOBENZAPRINE HCL 10 MG PO TABS: 10.0000 mg | ORAL_TABLET | Freq: Two times a day (BID) | ORAL | Status: DC | PRN

## 2012-05-21 MED ORDER — PROMETHAZINE HCL 25 MG PO TABS: 25.0000 mg | ORAL_TABLET | Freq: Two times a day (BID) | ORAL | Status: DC | PRN

## 2012-05-21 MED ORDER — BUDESONIDE-FORMOTEROL FUMARATE 160-4.5 MCG/ACT IN AERO: 2.0000 | INHALATION_SPRAY | Freq: Two times a day (BID) | RESPIRATORY_TRACT | Status: DC

## 2012-05-21 MED ORDER — ZOLPIDEM TARTRATE 10 MG PO TABS: 5.0000 mg | ORAL_TABLET | Freq: Every evening | ORAL | Status: DC | PRN

## 2012-05-21 NOTE — Assessment & Plan Note (Signed)
Will try Zolpidem prn w/caution

## 2012-05-21 NOTE — Assessment & Plan Note (Signed)
Continue with current prescription therapy as reflected on the Med list.  

## 2012-05-21 NOTE — Progress Notes (Signed)
   Subjective:    HPI   The patient presents for a follow-up of  chronic hypertension, chronic dyslipidemia, asthma,  type 2 diabetes controlled with medicines. C/o occ constipation - stool q2-3 d  She did see Dr Everardo All. She is c/o hand arthritis problems... C/o insomnia  BP Readings from Last 3 Encounters:  05/21/12 118/90  03/27/12 124/76  01/18/12 118/88   Wt Readings from Last 3 Encounters:  05/21/12 177 lb (80.287 kg)  03/27/12 176 lb (79.833 kg)  01/18/12 178 lb (80.74 kg)       Review of Systems  Constitutional: Negative for chills, activity change, appetite change, fatigue and unexpected weight change.  HENT: Negative for congestion, mouth sores and sinus pressure.   Eyes: Negative for visual disturbance.  Respiratory: Negative for cough and chest tightness.   Gastrointestinal: Negative for nausea and abdominal pain.  Genitourinary: Negative for frequency, difficulty urinating and vaginal pain.  Musculoskeletal: Negative for back pain and gait problem.  Skin: Negative for pallor and rash.  Neurological: Negative for dizziness, tremors, weakness, numbness and headaches.  Psychiatric/Behavioral: Negative for confusion and sleep disturbance.       Objective:   Physical Exam  Constitutional: She appears well-developed and well-nourished. No distress.  HENT:  Head: Normocephalic.  Right Ear: External ear normal.  Left Ear: External ear normal.  Nose: Nose normal.  Mouth/Throat: Oropharynx is clear and moist.  Eyes: Conjunctivae are normal. Pupils are equal, round, and reactive to light. Right eye exhibits no discharge. Left eye exhibits no discharge.  Neck: Normal range of motion. Neck supple. No JVD present. No tracheal deviation present. No thyromegaly present.  Cardiovascular: Normal rate, regular rhythm and normal heart sounds.   Pulmonary/Chest: No stridor. No respiratory distress. She has no wheezes.  Abdominal: Soft. Bowel sounds are normal. She exhibits  no distension and no mass. There is no tenderness. There is no rebound and no guarding.  Musculoskeletal: She exhibits no edema and no tenderness.  Lymphadenopathy:    She has no cervical adenopathy.  Neurological: She displays normal reflexes. No cranial nerve deficit. She exhibits normal muscle tone. Coordination normal.  Skin: No rash noted. No erythema.  Psychiatric: She has a normal mood and affect. Her behavior is normal. Judgment and thought content normal.    Lab Results  Component Value Date   WBC 8.4 09/16/2011   HGB 12.1 09/16/2011   HCT 37.5 09/16/2011   PLT 311.0 09/16/2011   GLUCOSE 130* 09/16/2011   CHOL 192 09/16/2011   TRIG 126.0 09/16/2011   HDL 44.30 09/16/2011   LDLDIRECT 122.0 03/03/2008   LDLCALC 123* 09/16/2011   ALT 23 09/16/2011   AST 26 09/16/2011   NA 137 09/16/2011   K 4.2 09/16/2011   CL 100 09/16/2011   CREATININE 0.7 09/16/2011   BUN 14 09/16/2011   CO2 28 09/16/2011   TSH 2.61 09/16/2011   HGBA1C 6.6* 12/21/2011   MICROALBUR 0.4 12/07/2009         Assessment & Plan:

## 2012-05-21 NOTE — Assessment & Plan Note (Signed)
Labs

## 2012-05-22 LAB — ANA: Anti Nuclear Antibody(ANA): NEGATIVE

## 2012-07-25 ENCOUNTER — Encounter: Payer: Self-pay | Admitting: Endocrinology

## 2012-07-25 ENCOUNTER — Ambulatory Visit (INDEPENDENT_AMBULATORY_CARE_PROVIDER_SITE_OTHER): Payer: Commercial Managed Care - PPO | Admitting: Endocrinology

## 2012-07-25 VITALS — BP 122/70 | HR 80 | Ht 68.0 in | Wt 173.0 lb

## 2012-07-25 DIAGNOSIS — E119 Type 2 diabetes mellitus without complications: Secondary | ICD-10-CM

## 2012-07-25 NOTE — Progress Notes (Signed)
Subjective:    Patient ID: Alexandra [name], female    DOB: 01-11-1966, 47 y.o.   MRN: [id number]  HPI pt return for f/u of type 2 DM (dx'ed 2003; no chronic complications; she is on 4 oral agents).  she brings a record of her cbg's which i have reviewed today.  It varies from 58-200, but most are approx 100. There is no trend throughout the day.  pt states she feels well in general, except intermittent leg cramps.   Past Medical History  Diagnosis Date  . Diabetes mellitus   . Low back pain   . Osteoarthritis   . CTS (carpal tunnel syndrome)   . Glaucoma     ?  . Polyarthralgia   . Asthma   . Hypertension   . Vitamin B12 deficiency   . ALLERGIC RHINITIS     No past surgical history on file.  History   Social History  . Marital Status: Single    Spouse Name: N/A    Number of Children: N/A  . Years of Education: N/A   Occupational History  . 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 Smoking status: Never Smoker   . Smokeless tobacco: Not on file  . Alcohol Use: No  . Drug Use: No  . Sexually Active: Not Currently   Other Topics Concern  . Not on file   Social History Narrative  . No narrative on file    Current Outpatient Prescriptions on File Prior to Visit  Medication Sig Dispense Refill  . albuterol (VENTOLIN HFA) 108 (90 BASE) MCG/ACT inhaler Inhale 1 puff into the lungs every 4 (four) hours as needed.  3 Inhaler  3  . bromocriptine (PARLODEL) 2.5 MG tablet Take 1 tablet (2.5 mg total) by mouth daily.  90 tablet  3  . budesonide-formoterol (SYMBICORT) 160-4.5 MCG/ACT inhaler Inhale 2 puffs into the lungs 2 (two) times daily.  3 Inhaler  3  . Cholecalciferol 1000 UNITS tablet Take 1 tablet (1,000 Units total) by mouth daily.  100 tablet  3  . cyclobenzaprine (FLEXERIL) 10 MG tablet Take 1 tablet (10 mg total) by mouth 2 (two) times daily as needed.  180 tablet  1  . fluticasone (FLONASE) 50 MCG/ACT nasal spray 2 sprays by Nasal route  daily.        . hydrochlorothiazide (MICROZIDE) 12.5 MG capsule Take 1 capsule (12.5 mg total) by mouth every morning.  90 capsule  3  . ibuprofen (ADVIL,MOTRIN) 600 MG tablet Take 1 tablet (600 mg total) by mouth every 6 (six) hours as needed for pain.  90 tablet  3  . levonorgestrel-ethinyl estradiol (AVIANE,ALESSE,LESSINA) 0.1-20 MG-MCG tablet Take 1 tablet by mouth as directed.  28 tablet  11  . loratadine (CLARITIN) 10 MG tablet Take 1 tablet (10 mg total) by mouth daily as needed. for allergies  90 tablet  3  . norethindrone (MICRONOR,CAMILA,ERRIN) 0.35 MG tablet Take 1 tablet (0.35 mg total) by mouth daily.  3 Package  3  . promethazine (PHENERGAN) 25 MG tablet Take 1 tablet (25 mg total) by mouth 2 (two) times daily as needed for nausea.  60 tablet  1  . ranitidine (ZANTAC) 300 MG tablet Take 1 tablet (300 mg total) by mouth daily.  90 tablet  3  . Saxagliptin-Metformin (KOMBIGLYZE XR) 2.07-998 MG TB24 2 tabs each morning  180 tablet  3  . terbinafine (LAMISIL) 1 % cream Apply topically  as needed.        . zolpidem (AMBIEN) 10 MG tablet Take 0.5-1 tablets (5-10 mg total) by mouth at bedtime as needed for sleep.  30 tablet  3   No current facility-administered medications on file prior to visit.    Allergies  Allergen Reactions  . Benazepril Hcl     REACTION: cough  . Tramadol Hcl     REACTION: itching    Family History  Problem Relation Age of Onset  . Hypertension    . Diabetes Mother   . Kidney disease Mother 14    ESRD on HD  . Heart disease Mother   . Cancer Father     head and neck  . Stroke Brother 45    CVAx2 on crack    BP 122/70  Pulse 80  Ht 5\' 8"  (1.727 m)  Wt 173 lb (78.472 kg)  BMI 26.31 kg/m2  SpO2 97%    Review of Systems Denies LOC    Objective:   Physical Exam VITAL SIGNS:  See vs page GENERAL: no distress   Lab Results  Component Value Date   HGBA1C 7.0* 05/21/2012      Assessment & Plan:  DM is overcontrolled, for this  sulfonylurea-containing regimen.

## 2012-07-25 NOTE — Patient Instructions (Addendum)
check your blood sugar once a day.  vary the time of day when you check, between before the 3 meals, and at bedtime.  also check if you have symptoms of your blood sugar being too high or too low.  please keep a record of the readings and bring it to your next appointment here.  please call us sooner if your blood sugar goes below 70, or if it stays over 200.  Please come back for a follow-up appointment in 4 months.   Please reduce the glimepiride to 1/2 pill in the morning only.

## 2012-08-21 ENCOUNTER — Ambulatory Visit: Payer: Commercial Managed Care - PPO | Admitting: Internal Medicine

## 2012-08-27 ENCOUNTER — Other Ambulatory Visit (INDEPENDENT_AMBULATORY_CARE_PROVIDER_SITE_OTHER): Payer: Commercial Managed Care - PPO

## 2012-08-27 ENCOUNTER — Encounter: Payer: Self-pay | Admitting: Internal Medicine

## 2012-08-27 ENCOUNTER — Ambulatory Visit (INDEPENDENT_AMBULATORY_CARE_PROVIDER_SITE_OTHER): Payer: Commercial Managed Care - PPO | Admitting: Internal Medicine

## 2012-08-27 VITALS — BP 110/90 | HR 80 | Temp 98.0°F | Resp 16 | Wt 173.0 lb

## 2012-08-27 DIAGNOSIS — M545 Low back pain, unspecified: Secondary | ICD-10-CM

## 2012-08-27 DIAGNOSIS — I10 Essential (primary) hypertension: Secondary | ICD-10-CM

## 2012-08-27 DIAGNOSIS — E119 Type 2 diabetes mellitus without complications: Secondary | ICD-10-CM

## 2012-08-27 DIAGNOSIS — R252 Cramp and spasm: Secondary | ICD-10-CM

## 2012-08-27 DIAGNOSIS — J45909 Unspecified asthma, uncomplicated: Secondary | ICD-10-CM

## 2012-08-27 DIAGNOSIS — E538 Deficiency of other specified B group vitamins: Secondary | ICD-10-CM

## 2012-08-27 DIAGNOSIS — M2550 Pain in unspecified joint: Secondary | ICD-10-CM

## 2012-08-27 LAB — BASIC METABOLIC PANEL
BUN: 13 mg/dL (ref 6–23)
CO2: 30 mEq/L (ref 19–32)
Chloride: 102 mEq/L (ref 96–112)
Creatinine, Ser: 0.7 mg/dL (ref 0.4–1.2)
Potassium: 4.3 mEq/L (ref 3.5–5.1)

## 2012-08-27 MED ORDER — LORATADINE 10 MG PO TABS: 10.0000 mg | ORAL_TABLET | Freq: Every day | ORAL | Status: DC | PRN

## 2012-08-27 MED ORDER — RANITIDINE HCL 300 MG PO TABS: 300.0000 mg | ORAL_TABLET | Freq: Every day | ORAL | Status: DC

## 2012-08-27 MED ORDER — IBUPROFEN 600 MG PO TABS: 600.0000 mg | ORAL_TABLET | Freq: Four times a day (QID) | ORAL | Status: DC | PRN

## 2012-08-27 MED ORDER — BROMOCRIPTINE MESYLATE 2.5 MG PO TABS: 2.5000 mg | ORAL_TABLET | Freq: Every day | ORAL | Status: DC

## 2012-08-27 MED ORDER — CYCLOBENZAPRINE HCL 10 MG PO TABS: 10.0000 mg | ORAL_TABLET | Freq: Two times a day (BID) | ORAL | Status: DC | PRN

## 2012-08-27 MED ORDER — BUDESONIDE-FORMOTEROL FUMARATE 160-4.5 MCG/ACT IN AERO: 2.0000 | INHALATION_SPRAY | Freq: Two times a day (BID) | RESPIRATORY_TRACT | Status: DC

## 2012-08-27 MED ORDER — HYDROCHLOROTHIAZIDE 12.5 MG PO CAPS: 12.5000 mg | ORAL_CAPSULE | ORAL | Status: DC

## 2012-08-27 MED ORDER — NORETHINDRONE 0.35 MG PO TABS: 1.0000 | ORAL_TABLET | Freq: Every day | ORAL | Status: DC

## 2012-08-27 MED ORDER — LEVONORGESTREL-ETHINYL ESTRAD 0.1-20 MG-MCG PO TABS: 1.0000 | ORAL_TABLET | ORAL | Status: DC

## 2012-08-27 MED ORDER — PROMETHAZINE HCL 25 MG PO TABS: 25.0000 mg | ORAL_TABLET | Freq: Two times a day (BID) | ORAL | Status: DC | PRN

## 2012-08-27 MED ORDER — ZOLPIDEM TARTRATE 10 MG PO TABS: 5.0000 mg | ORAL_TABLET | Freq: Every evening | ORAL | Status: DC | PRN

## 2012-08-27 MED ORDER — GLIMEPIRIDE 1 MG PO TABS: 0.5000 mg | ORAL_TABLET | Freq: Every day | ORAL | Status: DC

## 2012-08-27 NOTE — Assessment & Plan Note (Signed)
Added Parlodel - better

## 2012-08-27 NOTE — Progress Notes (Signed)
   Subjective:    HPI   The patient presents for a follow-up of  chronic hypertension, chronic dyslipidemia, asthma,  type 2 diabetes controlled with medicines. C/o occ constipation - stool q2-3 d  She did see Dr Everardo All. She is c/o hand arthritis problems... C/o pain in B knees and legs... C/o cramps in lower legs. It is worse after working for several days strait...  BP Readings from Last 3 Encounters:  08/27/12 110/90  07/25/12 122/70  05/21/12 118/90   Wt Readings from Last 3 Encounters:  08/27/12 173 lb (78.472 kg)  07/25/12 173 lb (78.472 kg)  05/21/12 177 lb (80.287 kg)       Review of Systems  Constitutional: Negative for chills, activity change, appetite change, fatigue and unexpected weight change.  HENT: Negative for congestion, mouth sores and sinus pressure.   Eyes: Negative for visual disturbance.  Respiratory: Negative for cough and chest tightness.   Gastrointestinal: Negative for nausea and abdominal pain.  Genitourinary: Negative for frequency, difficulty urinating and vaginal pain.  Musculoskeletal: Negative for back pain and gait problem.  Skin: Negative for pallor and rash.  Neurological: Negative for dizziness, tremors, weakness, numbness and headaches.  Psychiatric/Behavioral: Negative for confusion and sleep disturbance.       Objective:   Physical Exam  Constitutional: She appears well-developed and well-nourished. No distress.  HENT:  Head: Normocephalic.  Right Ear: External ear normal.  Left Ear: External ear normal.  Nose: Nose normal.  Mouth/Throat: Oropharynx is clear and moist.  Eyes: Conjunctivae are normal. Pupils are equal, round, and reactive to light. Right eye exhibits no discharge. Left eye exhibits no discharge.  Neck: Normal range of motion. Neck supple. No JVD present. No tracheal deviation present. No thyromegaly present.  Cardiovascular: Normal rate, regular rhythm and normal heart sounds.   Pulmonary/Chest: No stridor. No  respiratory distress. She has no wheezes.  Abdominal: Soft. Bowel sounds are normal. She exhibits no distension and no mass. There is no tenderness. There is no rebound and no guarding.  Musculoskeletal: She exhibits no edema and no tenderness.  Lymphadenopathy:    She has no cervical adenopathy.  Neurological: She displays normal reflexes. No cranial nerve deficit. She exhibits normal muscle tone. Coordination normal.  Skin: No rash noted. No erythema.  Psychiatric: She has a normal mood and affect. Her behavior is normal. Judgment and thought content normal.    Lab Results  Component Value Date   WBC 8.4 09/16/2011   HGB 12.1 09/16/2011   HCT 37.5 09/16/2011   PLT 311.0 09/16/2011   GLUCOSE 130* 09/16/2011   CHOL 192 09/16/2011   TRIG 126.0 09/16/2011   HDL 44.30 09/16/2011   LDLDIRECT 122.0 03/03/2008   LDLCALC 123* 09/16/2011   ALT 23 09/16/2011   AST 26 09/16/2011   NA 137 09/16/2011   K 4.2 09/16/2011   CL 100 09/16/2011   CREATININE 0.7 09/16/2011   BUN 14 09/16/2011   CO2 28 09/16/2011   TSH 3.01 05/21/2012   HGBA1C 7.0* 05/21/2012   MICROALBUR 0.4 12/07/2009         Assessment & Plan:

## 2012-08-27 NOTE — Assessment & Plan Note (Signed)
Continue with current prescription therapy as reflected on the Med list.  

## 2012-08-27 NOTE — Assessment & Plan Note (Signed)
6/14 LEs ?HCTZ related Hold HCTZ

## 2012-08-27 NOTE — Patient Instructions (Signed)
-  Hold HCTZ °

## 2012-08-27 NOTE — Assessment & Plan Note (Signed)
Ibuprofen prn 

## 2012-09-28 ENCOUNTER — Ambulatory Visit (INDEPENDENT_AMBULATORY_CARE_PROVIDER_SITE_OTHER): Payer: Commercial Managed Care - PPO | Admitting: Internal Medicine

## 2012-09-28 ENCOUNTER — Encounter: Payer: Self-pay | Admitting: Internal Medicine

## 2012-09-28 VITALS — BP 130/90 | HR 80 | Temp 97.2°F | Resp 16 | Wt 171.0 lb

## 2012-09-28 DIAGNOSIS — S91301A Unspecified open wound, right foot, initial encounter: Secondary | ICD-10-CM

## 2012-09-28 DIAGNOSIS — Z23 Encounter for immunization: Secondary | ICD-10-CM

## 2012-09-28 DIAGNOSIS — S91309A Unspecified open wound, unspecified foot, initial encounter: Secondary | ICD-10-CM

## 2012-09-28 MED ORDER — DOXYCYCLINE HYCLATE 100 MG PO TABS: 100.0000 mg | ORAL_TABLET | Freq: Two times a day (BID) | ORAL | Status: DC

## 2012-09-28 NOTE — Progress Notes (Signed)
Subjective:    HPI  C/o R foot blister x 5 d  The patient presents for a follow-up of  chronic hypertension, chronic dyslipidemia, asthma,  type 2 diabetes controlled with medicines. Occ constipation - stool q 2-3 d  She did see Dr Everardo All. She is c/o hand arthritis problems... C/o pain in B knees and legs... C/o cramps in lower legs. It is worse after working for several days strait...  BP Readings from Last 3 Encounters:  09/28/12 130/90  08/27/12 110/90  07/25/12 122/70   Wt Readings from Last 3 Encounters:  09/28/12 171 lb (77.565 kg)  08/27/12 173 lb (78.472 kg)  07/25/12 173 lb (78.472 kg)       Review of Systems  Constitutional: Negative for chills, activity change, appetite change, fatigue and unexpected weight change.  HENT: Negative for congestion, mouth sores and sinus pressure.   Eyes: Negative for visual disturbance.  Respiratory: Negative for cough and chest tightness.   Gastrointestinal: Negative for nausea and abdominal pain.  Genitourinary: Negative for frequency, difficulty urinating and vaginal pain.  Musculoskeletal: Negative for back pain and gait problem.  Skin: Negative for pallor and rash.  Neurological: Negative for dizziness, tremors, weakness, numbness and headaches.  Psychiatric/Behavioral: Negative for confusion and sleep disturbance.       Objective:   Physical Exam  Constitutional: She appears well-developed and well-nourished. No distress.  HENT:  Head: Normocephalic.  Right Ear: External ear normal.  Left Ear: External ear normal.  Nose: Nose normal.  Mouth/Throat: Oropharynx is clear and moist.  Eyes: Conjunctivae are normal. Pupils are equal, round, and reactive to light. Right eye exhibits no discharge. Left eye exhibits no discharge.  Neck: Normal range of motion. Neck supple. No JVD present. No tracheal deviation present. No thyromegaly present.  Cardiovascular: Normal rate, regular rhythm and normal heart sounds.    Pulmonary/Chest: No stridor. No respiratory distress. She has no wheezes.  Abdominal: Soft. Bowel sounds are normal. She exhibits no distension and no mass. There is no tenderness. There is no rebound and no guarding.  Musculoskeletal: She exhibits no edema and no tenderness.  Lymphadenopathy:    She has no cervical adenopathy.  Neurological: She displays normal reflexes. No cranial nerve deficit. She exhibits normal muscle tone. Coordination normal.  Skin: No rash noted. No erythema.  Psychiatric: She has a normal mood and affect. Her behavior is normal. Judgment and thought content normal.  1 cm R medial big toe blister  Lab Results  Component Value Date   WBC 8.4 09/16/2011   HGB 12.1 09/16/2011   HCT 37.5 09/16/2011   PLT 311.0 09/16/2011   GLUCOSE 106* 08/27/2012   CHOL 192 09/16/2011   TRIG 126.0 09/16/2011   HDL 44.30 09/16/2011   LDLDIRECT 122.0 03/03/2008   LDLCALC 123* 09/16/2011   ALT 23 09/16/2011   AST 26 09/16/2011   NA 143 08/27/2012   K 4.3 08/27/2012   CL 102 08/27/2012   CREATININE 0.7 08/27/2012   BUN 13 08/27/2012   CO2 30 08/27/2012   TSH 3.01 05/21/2012   HGBA1C 7.2* 08/27/2012   MICROALBUR 0.4 12/07/2009    Procedure note:  Incision, wound debridement and drainage of hematoma  Indication : a blister   Risks including unsuccessful procedure , possible need for a repeat procedure, scar formation, and others as well as benefits were explained to the patient and her dtr in detail. Oral consent was obtained/signed.    The patient was placed in a sitting position.  The area around wound was prepped with povidone-iodine and draped in a sterile fashion. Dead skin was removed. About 1 cc of fluid was expressed.  The wound was dressed with abx ointment and bandaid.  Tolerated well.  Complications: None.  Wound instructions provided.   Wound instructions : change dressing once a day or twice a day is needed. Change dressing after  shower in the morning.  Pat dry the wound with gauze.  Pull out one inch of packing everyday and cut it off. Re-dress wound with antibiotic ointment and a band aid.   Please contact us if you notice increased swelling or pain in the area.      Assessment & Plan:

## 2012-09-28 NOTE — Assessment & Plan Note (Signed)
7/14 R big toe - blister from friction vs insect bite See procedure Doxy if worse Topical care tDAP

## 2012-09-28 NOTE — Addendum Note (Signed)
Addended by: Merrilyn Puma on: 09/28/2012 01:28 PM   Modules accepted: Orders

## 2012-09-28 NOTE — Patient Instructions (Addendum)
Wound instructions : change dressing once a day or twice a day is needed. Change dressing after  shower in the morning.  Pat dry the wound with gauze. Pull out one inch of packing everyday and cut it off. Re-dress wound with antibiotic ointment and a band aid.   Please contact us if you notice increased swelling or pain in the area.

## 2012-10-01 ENCOUNTER — Other Ambulatory Visit: Payer: Self-pay | Admitting: *Deleted

## 2012-10-01 MED ORDER — SAXAGLIPTIN-METFORMIN ER 2.5-1000 MG PO TB24: ORAL_TABLET | ORAL | Status: DC

## 2012-10-15 ENCOUNTER — Other Ambulatory Visit: Payer: Self-pay

## 2012-10-15 DIAGNOSIS — Z1231 Encounter for screening mammogram for malignant neoplasm of breast: Secondary | ICD-10-CM

## 2012-11-12 ENCOUNTER — Ambulatory Visit (INDEPENDENT_AMBULATORY_CARE_PROVIDER_SITE_OTHER): Payer: Commercial Managed Care - PPO | Admitting: Internal Medicine

## 2012-11-12 ENCOUNTER — Encounter: Payer: Self-pay | Admitting: Internal Medicine

## 2012-11-12 VITALS — BP 130/96 | HR 64 | Temp 98.4°F | Resp 16 | Wt 175.0 lb

## 2012-11-12 DIAGNOSIS — IMO0002 Reserved for concepts with insufficient information to code with codable children: Secondary | ICD-10-CM

## 2012-11-12 DIAGNOSIS — E538 Deficiency of other specified B group vitamins: Secondary | ICD-10-CM

## 2012-11-12 MED ORDER — KETOROLAC TROMETHAMINE 60 MG/2ML IM SOLN
60.0000 mg | Freq: Once | INTRAMUSCULAR | Status: AC
  Administered 2012-11-12: 60 mg via INTRAMUSCULAR

## 2012-11-12 MED ORDER — IBUPROFEN 600 MG PO TABS: 600.0000 mg | ORAL_TABLET | Freq: Four times a day (QID) | ORAL | Status: DC | PRN

## 2012-11-12 MED ORDER — HYDROCODONE-ACETAMINOPHEN 5-325 MG PO TABS: 0.5000 | ORAL_TABLET | Freq: Three times a day (TID) | ORAL | Status: DC | PRN

## 2012-11-12 NOTE — Assessment & Plan Note (Signed)
Continue with current prescription therapy as reflected on the Med list.  

## 2012-11-12 NOTE — Progress Notes (Signed)
Patient ID: Alexandra [name], female   DOB: [birth year], 47 y.o.   MRN: [id number]   Subjective:    Back Pain This is a new problem. The current episode started in the past 7 days. The problem occurs constantly. The pain is present in the gluteal, lumbar spine and sacro-iliac. The quality of the pain is described as shooting and cramping. The pain radiates to the right thigh, right knee and right foot. The pain is at a severity of 9/10. The pain is severe. Pertinent negatives include no abdominal pain, headaches, numbness or weakness. She has tried NSAIDs and bed rest for the symptoms. The treatment provided mild relief.     The patient presents for a follow-up of  chronic hypertension, chronic dyslipidemia, asthma,  type 2 diabetes controlled with medicines. C/o occ constipation - stool q2-3 d  She did see Dr Everardo All. She is c/o hand arthritis problems... C/o insomnia  BP Readings from Last 3 Encounters:  11/12/12 130/96  09/28/12 130/90  08/27/12 110/90   Wt Readings from Last 3 Encounters:  11/12/12 175 lb (79.379 kg)  09/28/12 171 lb (77.565 kg)  08/27/12 173 lb (78.472 kg)       Review of Systems  Constitutional: Negative for chills, activity change, appetite change, fatigue and unexpected weight change.  HENT: Negative for congestion, mouth sores and sinus pressure.   Eyes: Negative for visual disturbance.  Respiratory: Negative for cough and chest tightness.   Gastrointestinal: Negative for nausea and abdominal pain.  Genitourinary: Negative for frequency, difficulty urinating and vaginal pain.  Musculoskeletal: Positive for back pain. Negative for joint swelling and gait problem.  Skin: Negative for pallor and rash.  Neurological: Negative for dizziness, tremors, weakness, numbness and headaches.  Psychiatric/Behavioral: Negative for confusion and sleep disturbance.       Objective:   Physical Exam  Constitutional: She appears well-developed and well-nourished. She  appears distressed (due to LBP).  HENT:  Head: Normocephalic.  Right Ear: External ear normal.  Left Ear: External ear normal.  Nose: Nose normal.  Mouth/Throat: Oropharynx is clear and moist.  Eyes: Conjunctivae are normal. Pupils are equal, round, and reactive to light. Right eye exhibits no discharge. Left eye exhibits no discharge.  Neck: Normal range of motion. Neck supple. No JVD present. No tracheal deviation present. No thyromegaly present.  Cardiovascular: Normal rate, regular rhythm and normal heart sounds.   Pulmonary/Chest: No stridor. No respiratory distress. She has no wheezes.  Abdominal: Soft. Bowel sounds are normal. She exhibits no distension and no mass. There is no tenderness. There is no rebound and no guarding.  Musculoskeletal: She exhibits tenderness. She exhibits no edema.  LS tender str leg elev (+) R  Lymphadenopathy:    She has no cervical adenopathy.  Neurological: She displays normal reflexes. No cranial nerve deficit. She exhibits normal muscle tone. Coordination normal.  Skin: No rash noted. No erythema.  Psychiatric: She has a normal mood and affect. Her behavior is normal. Judgment and thought content normal.    Lab Results  Component Value Date   WBC 8.4 09/16/2011   HGB 12.1 09/16/2011   HCT 37.5 09/16/2011   PLT 311.0 09/16/2011   GLUCOSE 106* 08/27/2012   CHOL 192 09/16/2011   TRIG 126.0 09/16/2011   HDL 44.30 09/16/2011   LDLDIRECT 122.0 03/03/2008   LDLCALC 123* 09/16/2011   ALT 23 09/16/2011   AST 26 09/16/2011   NA 143 08/27/2012   K 4.3 08/27/2012   CL 102 08/27/2012  CREATININE 0.7 08/27/2012   BUN 13 08/27/2012   CO2 30 08/27/2012   TSH 3.01 05/21/2012   HGBA1C 7.2* 08/27/2012   MICROALBUR 0.4 12/07/2009         Assessment & Plan:

## 2012-11-12 NOTE — Assessment & Plan Note (Signed)
2010 8/12014  MRI 12/2008:  IMPRESSION:  1. Moderate sized posterolateral disc protrusion on the right at  L5-S1 results in right S1 nerve root compression and would be  expected to be symptomatic. The left lateral recess is also mildly  narrowed at that level.  2. Annular disc bulging, a shallow right foraminal disc protrusion  and posterior element hypertrophy superimposed on congenital  factors result in mild to moderate central and lateral recess  stenosis bilaterally at L4-L5.  3. Broad-based lateral foraminal disc protrusion on the left at L3-  L4 with resulting extraforaminal left L3 nerve root encroachment.  This is the likely cause of the patient's current left lower  extremity radicular symptoms.  4. Uterine fibroids.  Provider: Fabiola Backer  Start PT Ortho consult

## 2012-11-15 ENCOUNTER — Ambulatory Visit: Payer: Commercial Managed Care - PPO | Attending: Internal Medicine | Admitting: Physical Therapy

## 2012-11-15 DIAGNOSIS — R293 Abnormal posture: Secondary | ICD-10-CM | POA: Insufficient documentation

## 2012-11-15 DIAGNOSIS — M545 Low back pain, unspecified: Secondary | ICD-10-CM | POA: Insufficient documentation

## 2012-11-15 DIAGNOSIS — IMO0001 Reserved for inherently not codable concepts without codable children: Secondary | ICD-10-CM | POA: Insufficient documentation

## 2012-11-15 DIAGNOSIS — R262 Difficulty in walking, not elsewhere classified: Secondary | ICD-10-CM | POA: Insufficient documentation

## 2012-11-15 DIAGNOSIS — M25569 Pain in unspecified knee: Secondary | ICD-10-CM | POA: Insufficient documentation

## 2012-11-16 ENCOUNTER — Ambulatory Visit
Admission: RE | Admit: 2012-11-16 | Discharge: 2012-11-16 | Disposition: A | Discharge status: Home / Self Care | Payer: Commercial Managed Care - PPO | Source: Ambulatory Visit

## 2012-11-16 DIAGNOSIS — Z1231 Encounter for screening mammogram for malignant neoplasm of breast: Secondary | ICD-10-CM

## 2012-11-26 ENCOUNTER — Ambulatory Visit: Payer: Commercial Managed Care - PPO | Attending: Internal Medicine | Admitting: Physical Therapy

## 2012-11-26 DIAGNOSIS — M545 Low back pain, unspecified: Secondary | ICD-10-CM | POA: Insufficient documentation

## 2012-11-26 DIAGNOSIS — R293 Abnormal posture: Secondary | ICD-10-CM | POA: Insufficient documentation

## 2012-11-26 DIAGNOSIS — M25569 Pain in unspecified knee: Secondary | ICD-10-CM | POA: Insufficient documentation

## 2012-11-26 DIAGNOSIS — R262 Difficulty in walking, not elsewhere classified: Secondary | ICD-10-CM | POA: Insufficient documentation

## 2012-11-26 DIAGNOSIS — IMO0001 Reserved for inherently not codable concepts without codable children: Secondary | ICD-10-CM | POA: Insufficient documentation

## 2012-11-27 ENCOUNTER — Encounter: Payer: Self-pay | Admitting: Internal Medicine

## 2012-11-27 ENCOUNTER — Ambulatory Visit (INDEPENDENT_AMBULATORY_CARE_PROVIDER_SITE_OTHER): Payer: Commercial Managed Care - PPO | Admitting: Internal Medicine

## 2012-11-27 VITALS — BP 138/100 | HR 83 | Temp 98.6°F | Wt 173.0 lb

## 2012-11-27 DIAGNOSIS — M545 Low back pain, unspecified: Secondary | ICD-10-CM

## 2012-11-27 DIAGNOSIS — I10 Essential (primary) hypertension: Secondary | ICD-10-CM

## 2012-11-27 DIAGNOSIS — Z23 Encounter for immunization: Secondary | ICD-10-CM

## 2012-11-27 DIAGNOSIS — IMO0002 Reserved for concepts with insufficient information to code with codable children: Secondary | ICD-10-CM

## 2012-11-27 DIAGNOSIS — E119 Type 2 diabetes mellitus without complications: Secondary | ICD-10-CM

## 2012-11-27 NOTE — Assessment & Plan Note (Signed)
Continue with current prescription therapy as reflected on the Med list.  

## 2012-11-27 NOTE — Assessment & Plan Note (Signed)
Dr Farris Has. MRI is pending. In PT. Off work

## 2012-11-27 NOTE — Progress Notes (Signed)
Patient ID: Alexandra [name], female   DOB: June 18, 1965, 47 y.o.   MRN: [id number] Patient ID: Alexandra [name], female   DOB: 01-18-1966, 47 y.o.   MRN: [id number]   Subjective:    Back Pain This is a recurrent problem. The current episode started 1 to 4 weeks ago. The problem occurs constantly. The pain is present in the gluteal, lumbar spine and sacro-iliac. The quality of the pain is described as shooting and cramping. The pain radiates to the right thigh, right knee and right foot. The pain is at a severity of 9/10. The pain is severe. Pertinent negatives include no abdominal pain, bladder incontinence, headaches, numbness or weakness. She has tried NSAIDs, bed rest and home exercises for the symptoms. The treatment provided moderate (better on Prednisone) relief.  Dr Farris Has. MRI is pending. In PT.   The patient presents for a follow-up of  chronic hypertension, chronic dyslipidemia, asthma,  type 2 diabetes controlled with medicines. C/o occ constipation - stool q2-3 d  She did see Dr Everardo All. She is c/o hand arthritis problems... C/o insomnia  BP Readings from Last 3 Encounters:  11/27/12 138/100  11/12/12 130/96  09/28/12 130/90   Wt Readings from Last 3 Encounters:  11/27/12 173 lb (78.472 kg)  11/12/12 175 lb (79.379 kg)  09/28/12 171 lb (77.565 kg)       Review of Systems  Constitutional: Negative for chills, activity change, appetite change, fatigue and unexpected weight change.  HENT: Negative for congestion, mouth sores and sinus pressure.   Eyes: Negative for visual disturbance.  Respiratory: Negative for cough and chest tightness.   Gastrointestinal: Negative for nausea and abdominal pain.  Genitourinary: Negative for bladder incontinence, frequency, difficulty urinating and vaginal pain.  Musculoskeletal: Positive for back pain. Negative for joint swelling and gait problem.  Skin: Negative for pallor and rash.  Neurological: Negative for dizziness, tremors, weakness,  numbness and headaches.  Psychiatric/Behavioral: Negative for confusion and sleep disturbance.       Objective:   Physical Exam  Constitutional: She appears well-developed and well-nourished. She appears distressed (due to LBP).  HENT:  Head: Normocephalic.  Right Ear: External ear normal.  Left Ear: External ear normal.  Nose: Nose normal.  Mouth/Throat: Oropharynx is clear and moist.  Eyes: Conjunctivae are normal. Pupils are equal, round, and reactive to light. Right eye exhibits no discharge. Left eye exhibits no discharge.  Neck: Normal range of motion. Neck supple. No JVD present. No tracheal deviation present. No thyromegaly present.  Cardiovascular: Normal rate, regular rhythm and normal heart sounds.   Pulmonary/Chest: No stridor. No respiratory distress. She has no wheezes.  Abdominal: Soft. Bowel sounds are normal. She exhibits no distension and no mass. There is no tenderness. There is no rebound and no guarding.  Musculoskeletal: She exhibits tenderness. She exhibits no edema.  LS tender str leg elev (+) R  Lymphadenopathy:    She has no cervical adenopathy.  Neurological: She displays normal reflexes. No cranial nerve deficit. She exhibits normal muscle tone. Coordination normal.  Skin: No rash noted. No erythema.  Psychiatric: She has a normal mood and affect. Her behavior is normal. Judgment and thought content normal.    Lab Results  Component Value Date   WBC 8.4 09/16/2011   HGB 12.1 09/16/2011   HCT 37.5 09/16/2011   PLT 311.0 09/16/2011   GLUCOSE 106* 08/27/2012   CHOL 192 09/16/2011   TRIG 126.0 09/16/2011   HDL 44.30 09/16/2011   LDLDIRECT 122.0 03/03/2008  LDLCALC 123* 09/16/2011   ALT 23 09/16/2011   AST 26 09/16/2011   NA 143 08/27/2012   K 4.3 08/27/2012   CL 102 08/27/2012   CREATININE 0.7 08/27/2012   BUN 13 08/27/2012   CO2 30 08/27/2012   TSH 3.01 05/21/2012   HGBA1C 7.2* 08/27/2012   MICROALBUR 0.4 12/07/2009         Assessment & Plan:

## 2012-11-27 NOTE — Assessment & Plan Note (Signed)
Dr Farris Has. MRI is pending. In PT.

## 2012-11-28 ENCOUNTER — Ambulatory Visit (INDEPENDENT_AMBULATORY_CARE_PROVIDER_SITE_OTHER): Payer: Commercial Managed Care - PPO | Admitting: Endocrinology

## 2012-11-28 ENCOUNTER — Encounter: Payer: Self-pay | Admitting: Endocrinology

## 2012-11-28 VITALS — BP 130/70 | HR 76 | Ht 68.0 in | Wt 174.0 lb

## 2012-11-28 DIAGNOSIS — E119 Type 2 diabetes mellitus without complications: Secondary | ICD-10-CM

## 2012-11-28 NOTE — Patient Instructions (Addendum)
check your blood sugar once a day.  vary the time of day when you check, between before the 3 meals, and at bedtime.  also check if you have symptoms of your blood sugar being too high or too low.  please keep a record of the readings and bring it to your next appointment here.  please call us sooner if your blood sugar goes below 70, or if it stays over 200.   Please come back for a follow-up appointment in 4 months.   blood tests are being requested for you today.  We'll contact you with results.  Based on the results, you may need to temporarily increase the glimepiride.

## 2012-11-28 NOTE — Progress Notes (Signed)
Subjective:    Patient ID: Alexandra [name], female    DOB: 02/14/1966, 47 y.o.   MRN: [id number]  HPI pt return for f/u of type 2 DM (dx'ed 2003; she has mild if any neuropathy of the lower extremities; no associated chronic complications; she is on 4 oral agents).  no cbg record, but states cbg's vary from 77-200's.  pt is being rx'ed for back pain with prednisone.   Past Medical History  Diagnosis Date  . Diabetes mellitus   . Low back pain   . Osteoarthritis   . CTS (carpal tunnel syndrome)   . Glaucoma     ?  . Polyarthralgia   . Asthma   . Hypertension   . Vitamin B12 deficiency   . ALLERGIC RHINITIS     No past surgical history on file.  History   Social History  . Marital Status: Single    Spouse Name: N/A    Number of Children: N/A  . Years of Education: N/A   Occupational History  . 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 Smoking status: Never Smoker   . Smokeless tobacco: Not on file  . Alcohol Use: No  . Drug Use: No  . Sexual Activity: Not Currently   Other Topics Concern  . Not on file   Social History Narrative  . No narrative on file    Current Outpatient Prescriptions on File Prior to Visit  Medication Sig Dispense Refill  . albuterol (VENTOLIN HFA) 108 (90 BASE) MCG/ACT inhaler Inhale 1 puff into the lungs every 4 (four) hours as needed.  3 Inhaler  3  . bromocriptine (PARLODEL) 2.5 MG tablet Take 1 tablet (2.5 mg total) by mouth daily.  90 tablet  3  . budesonide-formoterol (SYMBICORT) 160-4.5 MCG/ACT inhaler Inhale 2 puffs into the lungs 2 (two) times daily.  3 Inhaler  3  . Cholecalciferol 1000 UNITS tablet Take 1 tablet (1,000 Units total) by mouth daily.  100 tablet  3  . cyclobenzaprine (FLEXERIL) 10 MG tablet Take 1 tablet (10 mg total) by mouth 2 (two) times daily as needed.  180 tablet  1  . doxycycline (VIBRA-TABS) 100 MG tablet Take 1 tablet (100 mg total) by mouth 2 (two) times daily.  20 tablet  0  .  fluticasone (FLONASE) 50 MCG/ACT nasal spray 2 sprays by Nasal route daily.        Marland Kitchen glimepiride (AMARYL) 1 MG tablet Take 0.5 tablets (0.5 mg total) by mouth daily before breakfast. 1/2 tab each morning  90 tablet  3  . hydrochlorothiazide (MICROZIDE) 12.5 MG capsule Take 1 capsule (12.5 mg total) by mouth every morning.  90 capsule  3  . HYDROcodone-acetaminophen (NORCO/VICODIN) 5-325 MG per tablet Take 0.5-1 tablets by mouth every 8 (eight) hours as needed for pain.  60 tablet  1  . ibuprofen (ADVIL,MOTRIN) 600 MG tablet Take 1 tablet (600 mg total) by mouth every 6 (six) hours as needed for pain.  90 tablet  3  . levonorgestrel-ethinyl estradiol (AVIANE,ALESSE,LESSINA) 0.1-20 MG-MCG tablet Take 1 tablet by mouth as directed.  28 tablet  11  . loratadine (CLARITIN) 10 MG tablet Take 1 tablet (10 mg total) by mouth daily as needed. for allergies  90 tablet  3  . norethindrone (MICRONOR,CAMILA,ERRIN) 0.35 MG tablet Take 1 tablet (0.35 mg total) by mouth daily.  3 Package  3  . predniSONE (DELTASONE) 10 MG tablet Take 10  mg by mouth as directed.      . promethazine (PHENERGAN) 25 MG tablet Take 1 tablet (25 mg total) by mouth 2 (two) times daily as needed for nausea.  60 tablet  1  . ranitidine (ZANTAC) 300 MG tablet Take 1 tablet (300 mg total) by mouth daily.  90 tablet  3  . Saxagliptin-Metformin (KOMBIGLYZE XR) 2.07-998 MG TB24 2 tabs each morning  180 tablet  3  . terbinafine (LAMISIL) 1 % cream Apply topically as needed.        . zolpidem (AMBIEN) 10 MG tablet Take 0.5-1 tablets (5-10 mg total) by mouth at bedtime as needed for sleep.  30 tablet  3   No current facility-administered medications on file prior to visit.    Allergies  Allergen Reactions  . Benazepril Hcl     REACTION: cough  . Tramadol Hcl     REACTION: itching    Family History  Problem Relation Age of Onset  . Hypertension    . Diabetes Mother   . Kidney disease Mother 67    ESRD on HD  . Heart disease Mother    . Cancer Father     head and neck  . Stroke Brother 45    CVAx2 on crack   BP 130/70  Pulse 76  Ht 5\' 8"  (1.727 m)  Wt 174 lb (78.926 kg)  BMI 26.46 kg/m2  SpO2 98%  Review of Systems denies hypoglycemia and weight change.      Objective:   Physical Exam VITAL SIGNS:  See vs page GENERAL: no distress  Lab Results  Component Value Date   HGBA1C 7.8* 11/28/2012      Assessment & Plan:  DM: this is the best control this pt should aim for, given this sulfonylurea-containing regimen.  We discussed changing the amaryl.  She wants to continue for now.   Low-back pain: prednisone complicates the rx of DM.

## 2012-11-29 ENCOUNTER — Ambulatory Visit: Payer: Commercial Managed Care - PPO | Admitting: Physical Therapy

## 2012-11-30 ENCOUNTER — Encounter: Payer: Self-pay | Admitting: Internal Medicine

## 2012-12-03 DIAGNOSIS — Z0279 Encounter for issue of other medical certificate: Secondary | ICD-10-CM

## 2012-12-05 ENCOUNTER — Ambulatory Visit: Payer: Commercial Managed Care - PPO | Admitting: Physical Therapy

## 2012-12-06 ENCOUNTER — Ambulatory Visit: Payer: Commercial Managed Care - PPO | Admitting: Physical Therapy

## 2012-12-10 ENCOUNTER — Ambulatory Visit: Payer: Commercial Managed Care - PPO | Admitting: Physical Therapy

## 2012-12-12 ENCOUNTER — Ambulatory Visit: Payer: Commercial Managed Care - PPO | Admitting: Physical Therapy

## 2013-02-12 DIAGNOSIS — M5417 Radiculopathy, lumbosacral region: Secondary | ICD-10-CM | POA: Insufficient documentation

## 2013-02-17 ENCOUNTER — Other Ambulatory Visit: Payer: Self-pay | Admitting: Internal Medicine

## 2013-02-25 ENCOUNTER — Ambulatory Visit (INDEPENDENT_AMBULATORY_CARE_PROVIDER_SITE_OTHER): Payer: Commercial Managed Care - PPO | Admitting: Internal Medicine

## 2013-02-25 ENCOUNTER — Encounter: Payer: Self-pay | Admitting: Internal Medicine

## 2013-02-25 VITALS — BP 142/80 | HR 72 | Temp 97.7°F | Resp 16 | Wt 181.0 lb

## 2013-02-25 DIAGNOSIS — M545 Low back pain, unspecified: Secondary | ICD-10-CM

## 2013-02-25 DIAGNOSIS — E538 Deficiency of other specified B group vitamins: Secondary | ICD-10-CM

## 2013-02-25 DIAGNOSIS — I10 Essential (primary) hypertension: Secondary | ICD-10-CM

## 2013-02-25 DIAGNOSIS — E119 Type 2 diabetes mellitus without complications: Secondary | ICD-10-CM

## 2013-02-25 DIAGNOSIS — IMO0002 Reserved for concepts with insufficient information to code with codable children: Secondary | ICD-10-CM

## 2013-02-25 MED ORDER — CYCLOBENZAPRINE HCL 10 MG PO TABS: 10.0000 mg | ORAL_TABLET | Freq: Two times a day (BID) | ORAL | Status: DC | PRN

## 2013-02-25 MED ORDER — LORATADINE 10 MG PO TABS: 10.0000 mg | ORAL_TABLET | Freq: Every day | ORAL | Status: DC | PRN

## 2013-02-25 MED ORDER — RANITIDINE HCL 300 MG PO TABS: 300.0000 mg | ORAL_TABLET | Freq: Every day | ORAL | Status: DC

## 2013-02-25 MED ORDER — IBUPROFEN 600 MG PO TABS: 600.0000 mg | ORAL_TABLET | Freq: Four times a day (QID) | ORAL | Status: DC | PRN

## 2013-02-25 MED ORDER — HYDROCHLOROTHIAZIDE 12.5 MG PO CAPS: 12.5000 mg | ORAL_CAPSULE | ORAL | Status: DC

## 2013-02-25 MED ORDER — ZOLPIDEM TARTRATE 10 MG PO TABS: 5.0000 mg | ORAL_TABLET | Freq: Every evening | ORAL | Status: DC | PRN

## 2013-02-25 NOTE — Assessment & Plan Note (Signed)
Continue with current prescription therapy as reflected on the Med list. BP Readings from Last 3 Encounters:  02/25/13 142/80  11/28/12 130/70  11/27/12 138/100

## 2013-02-25 NOTE — Progress Notes (Signed)
Subjective:    Back Pain This is a chronic problem. The current episode started 1 to 4 weeks ago. The problem occurs constantly. The problem has been waxing and waning since onset. The pain is present in the gluteal and lumbar spine. The pain is at a severity of 1/10. The pain is mild. Pertinent negatives include no abdominal pain, bladder incontinence, headaches, numbness or weakness. Treatments tried: surgery. The treatment provided significant relief.    Dr Yetta Barre did the surgery Oct 2014. Now is back to old job. Doing much better   The patient presents for a follow-up of  chronic hypertension, chronic dyslipidemia, asthma,  type 2 diabetes controlled with medicines.   She is seeing Dr Everardo All.   BP Readings from Last 3 Encounters:  02/25/13 142/80  11/28/12 130/70  11/27/12 138/100   Wt Readings from Last 3 Encounters:  02/25/13 181 lb (82.101 kg)  11/28/12 174 lb (78.926 kg)  11/27/12 173 lb (78.472 kg)       Review of Systems  Constitutional: Negative for chills, activity change, appetite change, fatigue and unexpected weight change.  HENT: Negative for congestion, mouth sores and sinus pressure.   Eyes: Negative for visual disturbance.  Respiratory: Negative for cough and chest tightness.   Gastrointestinal: Negative for nausea and abdominal pain.  Genitourinary: Negative for bladder incontinence, frequency, difficulty urinating and vaginal pain.  Musculoskeletal: Positive for back pain. Negative for gait problem and joint swelling.  Skin: Negative for pallor and rash.  Neurological: Negative for dizziness, tremors, weakness, numbness and headaches.  Psychiatric/Behavioral: Negative for confusion and sleep disturbance.       Objective:   Physical Exam  Constitutional: She appears well-developed and well-nourished.  HENT:  Head: Normocephalic.  Right Ear: External ear normal.  Left Ear: External ear normal.  Nose: Nose normal.  Mouth/Throat: Oropharynx is  clear and moist.  Eyes: Conjunctivae are normal. Pupils are equal, round, and reactive to light. Right eye exhibits no discharge. Left eye exhibits no discharge.  Neck: Normal range of motion. Neck supple. No JVD present. No tracheal deviation present. No thyromegaly present.  Cardiovascular: Normal rate, regular rhythm and normal heart sounds.   Pulmonary/Chest: No stridor. No respiratory distress. She has no wheezes.  Abdominal: Soft. Bowel sounds are normal. She exhibits no distension and no mass. There is no tenderness. There is no rebound and no guarding.  Musculoskeletal: She exhibits no edema and no tenderness.  LS is a little tender str leg elev (-) R  Lymphadenopathy:    She has no cervical adenopathy.  Neurological: She displays normal reflexes. No cranial nerve deficit. She exhibits normal muscle tone. Coordination normal.  Skin: No rash noted. No erythema.  Psychiatric: She has a normal mood and affect. Her behavior is normal. Judgment and thought content normal.    Lab Results  Component Value Date   WBC 8.4 09/16/2011   HGB 12.1 09/16/2011   HCT 37.5 09/16/2011   PLT 311.0 09/16/2011   GLUCOSE 106* 08/27/2012   CHOL 192 09/16/2011   TRIG 126.0 09/16/2011   HDL 44.30 09/16/2011   LDLDIRECT 122.0 03/03/2008   LDLCALC 123* 09/16/2011   ALT 23 09/16/2011   AST 26 09/16/2011   NA 143 08/27/2012   K 4.3 08/27/2012   CL 102 08/27/2012   CREATININE 0.7 08/27/2012   BUN 13 08/27/2012   CO2 30 08/27/2012   TSH 3.01 05/21/2012   HGBA1C 7.8* 11/28/2012   MICROALBUR 0.4 12/07/2009  Assessment & Plan:

## 2013-02-25 NOTE — Assessment & Plan Note (Signed)
Continue with current prescription therapy as reflected on the Med list.  

## 2013-02-25 NOTE — Progress Notes (Signed)
Pre visit review using our clinic review tool, if applicable. No additional management support is needed unless otherwise documented below in the visit note. 

## 2013-02-25 NOTE — Assessment & Plan Note (Signed)
Per Dr Everardo All - labs soon Continue with current prescription therapy as reflected on the Med list.

## 2013-02-25 NOTE — Assessment & Plan Note (Signed)
10/14 S/p surgery Dr Yetta Barre - better See Rx

## 2013-02-25 NOTE — Assessment & Plan Note (Addendum)
10/14 S/p surgery Dr Yetta Barre R radiculopathy is better

## 2013-03-05 ENCOUNTER — Encounter: Payer: Self-pay | Admitting: Endocrinology

## 2013-03-05 ENCOUNTER — Ambulatory Visit (INDEPENDENT_AMBULATORY_CARE_PROVIDER_SITE_OTHER): Payer: Commercial Managed Care - PPO | Admitting: Endocrinology

## 2013-03-05 VITALS — BP 124/84 | HR 87 | Temp 98.0°F | Ht 69.5 in | Wt 179.0 lb

## 2013-03-05 DIAGNOSIS — E119 Type 2 diabetes mellitus without complications: Secondary | ICD-10-CM

## 2013-03-05 LAB — MICROALBUMIN / CREATININE URINE RATIO
Creatinine,U: 29.1 mg/dL
Microalb, Ur: 0.2 mg/dL (ref 0.0–1.9)

## 2013-03-05 LAB — HEMOGLOBIN A1C: Hgb A1c MFr Bld: 7.6 % — ABNORMAL HIGH (ref 4.6–6.5)

## 2013-03-05 NOTE — Progress Notes (Signed)
Subjective:    Patient ID: Alexandra [name], female    DOB: 02/15/1966, 47 y.o.   MRN: [id number]  HPI pt returns for f/u of type 2 DM (dx'ed 2003, on a routine blood test; she has mild if any neuropathy of the lower extremities; no associated chronic complications; she has never been on insulin; she is on 4 oral agents).  no cbg record, but states cbg's are well-controlled.  pt is off the prednisone now. She takes amaryl 1 mg qam.   Past Medical History  Diagnosis Date  . Diabetes mellitus   . Low back pain   . Osteoarthritis   . CTS (carpal tunnel syndrome)   . Glaucoma     ?  . Polyarthralgia   . Asthma   . Hypertension   . Vitamin B12 deficiency   . ALLERGIC RHINITIS     No past surgical history on file.  History   Social History  . Marital Status: Single    Spouse Name: N/A    Number of Children: N/A  . Years of Education: N/A   Occupational History  . 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 Smoking status: Never Smoker   . Smokeless tobacco: Not on file  . Alcohol Use: No  . Drug Use: No  . Sexual Activity: Not Currently   Other Topics Concern  . Not on file   Social History Narrative  . No narrative on file    Current Outpatient Prescriptions on File Prior to Visit  Medication Sig Dispense Refill  . albuterol (VENTOLIN HFA) 108 (90 BASE) MCG/ACT inhaler Inhale 1 puff into the lungs every 4 (four) hours as needed.  3 Inhaler  3  . bromocriptine (PARLODEL) 2.5 MG tablet Take 1 tablet (2.5 mg total) by mouth daily.  90 tablet  3  . budesonide-formoterol (SYMBICORT) 160-4.5 MCG/ACT inhaler Inhale 2 puffs into the lungs 2 (two) times daily.  3 Inhaler  3  . Cholecalciferol 1000 UNITS tablet Take 1 tablet (1,000 Units total) by mouth daily.  100 tablet  3  . cyclobenzaprine (FLEXERIL) 10 MG tablet Take 1 tablet (10 mg total) by mouth 2 (two) times daily as needed.  90 tablet  1  . fluticasone (FLONASE) 50 MCG/ACT nasal spray 2  sprays by Nasal route daily.        . hydrochlorothiazide (MICROZIDE) 12.5 MG capsule Take 1 capsule (12.5 mg total) by mouth every morning.  90 capsule  3  . ibuprofen (ADVIL,MOTRIN) 600 MG tablet Take 1 tablet (600 mg total) by mouth every 6 (six) hours as needed.  90 tablet  3  . levonorgestrel-ethinyl estradiol (AVIANE,ALESSE,LESSINA) 0.1-20 MG-MCG tablet Take 1 tablet by mouth as directed.  28 tablet  11  . loratadine (CLARITIN) 10 MG tablet Take 1 tablet (10 mg total) by mouth daily as needed. for allergies  90 tablet  3  . norethindrone (MICRONOR,CAMILA,ERRIN) 0.35 MG tablet Take 1 tablet (0.35 mg total) by mouth daily.  3 Package  3  . predniSONE (DELTASONE) 10 MG tablet Take 10 mg by mouth as directed.      . promethazine (PHENERGAN) 25 MG tablet Take 1 tablet (25 mg total) by mouth 2 (two) times daily as needed for nausea.  60 tablet  1  . ranitidine (ZANTAC) 300 MG tablet Take 1 tablet (300 mg total) by mouth daily.  90 tablet  3  . Saxagliptin-Metformin (KOMBIGLYZE XR) 2.07-998 MG  TB24 2 tabs each morning  180 tablet  3  . terbinafine (LAMISIL) 1 % cream Apply topically as needed.        . zolpidem (AMBIEN) 10 MG tablet Take 0.5-1 tablets (5-10 mg total) by mouth at bedtime as needed for sleep.  30 tablet  3  . HYDROcodone-acetaminophen (NORCO/VICODIN) 5-325 MG per tablet Take 0.5-1 tablets by mouth every 8 (eight) hours as needed for pain.  60 tablet  1   No current facility-administered medications on file prior to visit.    Allergies  Allergen Reactions  . Benazepril Hcl     REACTION: cough  . Tramadol Hcl     REACTION: itching    Family History  Problem Relation Age of Onset  . Hypertension    . Diabetes Mother   . Kidney disease Mother 53    ESRD on HD  . Heart disease Mother   . Cancer Father     head and neck  . Stroke Brother 45    CVAx2 on crack    BP 124/84  Pulse 87  Temp(Src) 98 F (36.7 C) (Oral)  Ht 5' 9.5" (1.765 m)  Wt 179 lb (81.194 kg)  BMI  26.06 kg/m2  SpO2 97%  Review of Systems denies hypoglycemia and weight change.    Objective:   Physical Exam VITAL SIGNS:  See vs page GENERAL: no distress  Lab Results  Component Value Date   HGBA1C 7.6* 03/05/2013      Assessment & Plan:  Type 2 DM: this is the best control this pt should aim for, given this sulfonylurea-containing regimen. Asthma: intermittent steroid rx compromises glycemic control.

## 2013-03-05 NOTE — Patient Instructions (Addendum)
check your blood sugar once a day.  vary the time of day when you check, between before the 3 meals, and at bedtime.  also check if you have symptoms of your blood sugar being too high or too low.  please keep a record of the readings and bring it to your next appointment here.  please call us sooner if your blood sugar goes below 70, or if it stays over 200.   Please come back for a follow-up appointment in 4 months.   blood and urine tests are being requested for you today.  We'll contact you with results.

## 2013-06-26 ENCOUNTER — Encounter: Payer: Self-pay | Admitting: Internal Medicine

## 2013-06-26 ENCOUNTER — Ambulatory Visit (INDEPENDENT_AMBULATORY_CARE_PROVIDER_SITE_OTHER): Payer: Commercial Managed Care - PPO | Admitting: Internal Medicine

## 2013-06-26 ENCOUNTER — Other Ambulatory Visit (INDEPENDENT_AMBULATORY_CARE_PROVIDER_SITE_OTHER): Payer: Commercial Managed Care - PPO

## 2013-06-26 VITALS — BP 122/88 | HR 84 | Temp 97.3°F | Resp 16 | Wt 175.0 lb

## 2013-06-26 DIAGNOSIS — M545 Low back pain, unspecified: Secondary | ICD-10-CM

## 2013-06-26 DIAGNOSIS — E119 Type 2 diabetes mellitus without complications: Secondary | ICD-10-CM

## 2013-06-26 DIAGNOSIS — J45909 Unspecified asthma, uncomplicated: Secondary | ICD-10-CM

## 2013-06-26 DIAGNOSIS — R079 Chest pain, unspecified: Secondary | ICD-10-CM

## 2013-06-26 LAB — BASIC METABOLIC PANEL
BUN: 12 mg/dL (ref 6–23)
CHLORIDE: 103 meq/L (ref 96–112)
CO2: 28 mEq/L (ref 19–32)
Calcium: 9.8 mg/dL (ref 8.4–10.5)
Creatinine, Ser: 0.6 mg/dL (ref 0.4–1.2)
GFR: 135.07 mL/min (ref >60.00)
GLUCOSE: 102 mg/dL — AB (ref 70–99)
POTASSIUM: 4.3 meq/L (ref 3.5–5.1)
SODIUM: 140 meq/L (ref 135–145)

## 2013-06-26 LAB — HEMOGLOBIN A1C: Hgb A1c MFr Bld: 7.4 % — ABNORMAL HIGH (ref 4.6–6.5)

## 2013-06-26 MED ORDER — PROMETHAZINE HCL 25 MG PO TABS: 25.0000 mg | ORAL_TABLET | Freq: Two times a day (BID) | ORAL | Status: DC | PRN

## 2013-06-26 MED ORDER — CYCLOBENZAPRINE HCL 10 MG PO TABS: 10.0000 mg | ORAL_TABLET | Freq: Two times a day (BID) | ORAL | Status: DC | PRN

## 2013-06-26 MED ORDER — LORATADINE 10 MG PO TABS: 10.0000 mg | ORAL_TABLET | Freq: Every day | ORAL | Status: DC | PRN

## 2013-06-26 MED ORDER — BROMOCRIPTINE MESYLATE 2.5 MG PO TABS: 2.5000 mg | ORAL_TABLET | Freq: Every day | ORAL | Status: DC

## 2013-06-26 MED ORDER — HYDROCHLOROTHIAZIDE 12.5 MG PO CAPS
12.5000 mg | ORAL_CAPSULE | ORAL | Status: DC
Start: 2013-06-26 — End: 2013-12-27

## 2013-06-26 MED ORDER — ZOLPIDEM TARTRATE 10 MG PO TABS: 5.0000 mg | ORAL_TABLET | Freq: Every evening | ORAL | Status: DC | PRN

## 2013-06-26 MED ORDER — RANITIDINE HCL 300 MG PO TABS: 300.0000 mg | ORAL_TABLET | Freq: Every day | ORAL | Status: DC

## 2013-06-26 MED ORDER — SAXAGLIPTIN-METFORMIN ER 2.5-1000 MG PO TB24: ORAL_TABLET | ORAL | Status: DC

## 2013-06-26 MED ORDER — IBUPROFEN 600 MG PO TABS: 600.0000 mg | ORAL_TABLET | Freq: Four times a day (QID) | ORAL | Status: DC | PRN

## 2013-06-26 MED ORDER — GLIMEPIRIDE 1 MG PO TABS: 1.0000 mg | ORAL_TABLET | Freq: Every day | ORAL | Status: DC

## 2013-06-26 NOTE — Assessment & Plan Note (Signed)
Better  

## 2013-06-26 NOTE — Progress Notes (Signed)
Pre visit review using our clinic review tool, if applicable. No additional management support is needed unless otherwise documented below in the visit note. 

## 2013-06-26 NOTE — Assessment & Plan Note (Signed)
Previously asthma triggered See Rx

## 2013-06-26 NOTE — Progress Notes (Signed)
Subjective:    Back Pain This is a chronic problem. The current episode started 1 to 4 weeks ago. The problem occurs constantly. The problem has been gradually improving since onset. The pain is present in the gluteal and lumbar spine. The pain is at a severity of 1/10. The pain is mild. Pertinent negatives include no abdominal pain, bladder incontinence, headaches, numbness or weakness. Treatments tried: surgery. The treatment provided significant relief.    Dr Ronnald Ramp did the surgery Oct 2014. Now is back to old job. Doing much better   The patient presents for a follow-up of  chronic hypertension, chronic dyslipidemia, asthma,  type 2 diabetes controlled with medicines.   She is seeing Dr Loanne Drilling.   BP Readings from Last 3 Encounters:  06/26/13 122/88  03/05/13 124/84  02/25/13 142/80   Wt Readings from Last 3 Encounters:  06/26/13 175 lb (79.379 kg)  03/05/13 179 lb (81.194 kg)  02/25/13 181 lb (82.101 kg)       Review of Systems  Constitutional: Negative for chills, activity change, appetite change, fatigue and unexpected weight change.  HENT: Negative for congestion, mouth sores and sinus pressure.   Eyes: Negative for visual disturbance.  Respiratory: Negative for cough and chest tightness.   Gastrointestinal: Negative for nausea and abdominal pain.  Genitourinary: Negative for bladder incontinence, frequency, difficulty urinating and vaginal pain.  Musculoskeletal: Positive for back pain. Negative for gait problem and joint swelling.  Skin: Negative for pallor and rash.  Neurological: Negative for dizziness, tremors, weakness, numbness and headaches.  Psychiatric/Behavioral: Negative for confusion and sleep disturbance.       Objective:   Physical Exam  Constitutional: She appears well-developed and well-nourished.  HENT:  Head: Normocephalic.  Right Ear: External ear normal.  Left Ear: External ear normal.  Nose: Nose normal.  Mouth/Throat: Oropharynx is  clear and moist.  Eyes: Conjunctivae are normal. Pupils are equal, round, and reactive to light. Right eye exhibits no discharge. Left eye exhibits no discharge.  Neck: Normal range of motion. Neck supple. No JVD present. No tracheal deviation present. No thyromegaly present.  Cardiovascular: Normal rate, regular rhythm and normal heart sounds.   Pulmonary/Chest: No stridor. No respiratory distress. She has no wheezes.  Abdominal: Soft. Bowel sounds are normal. She exhibits no distension and no mass. There is no tenderness. There is no rebound and no guarding.  Musculoskeletal: She exhibits no edema and no tenderness.  LS is a little tender str leg elev (-) R  Lymphadenopathy:    She has no cervical adenopathy.  Neurological: She displays normal reflexes. No cranial nerve deficit. She exhibits normal muscle tone. Coordination normal.  Skin: No rash noted. No erythema.  Psychiatric: She has a normal mood and affect. Her behavior is normal. Judgment and thought content normal.    Lab Results  Component Value Date   WBC 8.4 09/16/2011   HGB 12.1 09/16/2011   HCT 37.5 09/16/2011   PLT 311.0 09/16/2011   GLUCOSE 106* 08/27/2012   CHOL 192 09/16/2011   TRIG 126.0 09/16/2011   HDL 44.30 09/16/2011   LDLDIRECT 122.0 03/03/2008   LDLCALC 123* 09/16/2011   ALT 23 09/16/2011   AST 26 09/16/2011   NA 143 08/27/2012   K 4.3 08/27/2012   CL 102 08/27/2012   CREATININE 0.7 08/27/2012   BUN 13 08/27/2012   CO2 30 08/27/2012   TSH 3.01 05/21/2012   HGBA1C 7.6* 03/05/2013   MICROALBUR 0.2 03/05/2013  Assessment & Plan:

## 2013-06-26 NOTE — Assessment & Plan Note (Signed)
Continue with current prescription therapy as reflected on the Med list. Appt w/Dr Loanne Drilling this month

## 2013-06-26 NOTE — Assessment & Plan Note (Signed)
Continue with current prescription therapy as reflected on the Med list.  

## 2013-06-27 ENCOUNTER — Other Ambulatory Visit: Payer: Self-pay | Admitting: *Deleted

## 2013-07-03 ENCOUNTER — Encounter: Payer: Self-pay | Admitting: *Deleted

## 2013-07-04 ENCOUNTER — Encounter: Payer: Self-pay | Admitting: Endocrinology

## 2013-07-04 ENCOUNTER — Ambulatory Visit (INDEPENDENT_AMBULATORY_CARE_PROVIDER_SITE_OTHER): Payer: Commercial Managed Care - PPO | Admitting: Endocrinology

## 2013-07-04 VITALS — BP 110/82 | HR 94 | Temp 98.2°F | Ht 69.5 in | Wt 176.0 lb

## 2013-07-04 DIAGNOSIS — E119 Type 2 diabetes mellitus without complications: Secondary | ICD-10-CM

## 2013-07-04 MED ORDER — GLUCOSE BLOOD VI STRP: 1.0000 | ORAL_STRIP | Freq: Every day | Status: DC

## 2013-07-04 MED ORDER — PIOGLITAZONE HCL 45 MG PO TABS: 45.0000 mg | ORAL_TABLET | Freq: Every day | ORAL | Status: DC

## 2013-07-04 NOTE — Progress Notes (Signed)
Subjective:    Patient ID: Alexandra [name], female    DOB: [birth year], 48 y.o.   MRN: [id number]  HPI pt returns for f/u of type 2 DM (dx'ed 2003, on a routine blood test; she has mild if any neuropathy of the lower extremities; no associated chronic complications; she has never been on insulin; she is on 4 oral agents; she works 3rd shift).  no cbg record, but states cbg's are well-controlled.  She does not check cbg's, but she feels as though she may be having mild hypoglycemia, if she hasn't eaten in a while.   Past Medical History  Diagnosis Date  . Diabetes mellitus   . Low back pain   . Osteoarthritis   . CTS (carpal tunnel syndrome)   . Glaucoma     ?  . Polyarthralgia   . Asthma   . Hypertension   . Vitamin B12 deficiency   . ALLERGIC RHINITIS     No past surgical history on file.  History   Social History  . Marital Status: Single    Spouse Name: N/A    Number of Children: N/A  . Years of Education: N/A   Occupational History  . 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 Smoking status: Never Smoker   . Smokeless tobacco: Not on file  . Alcohol Use: No  . Drug Use: No  . Sexual Activity: Not Currently   Other Topics Concern  . Not on file   Social History Narrative  . No narrative on file    Current Outpatient Prescriptions on File Prior to Visit  Medication Sig Dispense Refill  . albuterol (VENTOLIN HFA) 108 (90 BASE) MCG/ACT inhaler Inhale 1 puff into the lungs every 4 (four) hours as needed.  3 Inhaler  3  . bromocriptine (PARLODEL) 2.5 MG tablet Take 1 tablet (2.5 mg total) by mouth daily.  90 tablet  3  . budesonide-formoterol (SYMBICORT) 160-4.5 MCG/ACT inhaler Inhale 2 puffs into the lungs 2 (two) times daily.  3 Inhaler  3  . Cholecalciferol 1000 UNITS tablet Take 1 tablet (1,000 Units total) by mouth daily.  100 tablet  3  . cyclobenzaprine (FLEXERIL) 10 MG tablet Take 1 tablet (10 mg total) by mouth 2 (two) times  daily as needed.  90 tablet  1  . fluticasone (FLONASE) 50 MCG/ACT nasal spray 2 sprays by Nasal route daily.        . hydrochlorothiazide (MICROZIDE) 12.5 MG capsule Take 1 capsule (12.5 mg total) by mouth every morning.  90 capsule  3  . ibuprofen (ADVIL,MOTRIN) 600 MG tablet Take 1 tablet (600 mg total) by mouth every 6 (six) hours as needed.  90 tablet  3  . levonorgestrel-ethinyl estradiol (AVIANE,ALESSE,LESSINA) 0.1-20 MG-MCG tablet Take 1 tablet by mouth as directed.  28 tablet  11  . loratadine (CLARITIN) 10 MG tablet Take 1 tablet (10 mg total) by mouth daily as needed. for allergies  90 tablet  3  . norethindrone (MICRONOR,CAMILA,ERRIN) 0.35 MG tablet Take 1 tablet (0.35 mg total) by mouth daily.  3 Package  3  . promethazine (PHENERGAN) 25 MG tablet Take 1 tablet (25 mg total) by mouth 2 (two) times daily as needed for nausea.  60 tablet  1  . ranitidine (ZANTAC) 300 MG tablet Take 1 tablet (300 mg total) by mouth daily.  90 tablet  3  . Saxagliptin-Metformin (KOMBIGLYZE XR) 2.07-998 MG TB24 2 tabs  each morning  180 tablet  3  . terbinafine (LAMISIL) 1 % cream Apply topically as needed.        . zolpidem (AMBIEN) 10 MG tablet Take 0.5-1 tablets (5-10 mg total) by mouth at bedtime as needed for sleep.  90 tablet  1   No current facility-administered medications on file prior to visit.    Allergies  Allergen Reactions  . Benazepril Hcl     REACTION: cough  . Tramadol Hcl     REACTION: itching    Family History  Problem Relation Age of Onset  . Hypertension    . Diabetes Mother   . Kidney disease Mother 6    ESRD on HD  . Heart disease Mother   . Cancer Father     head and neck  . Stroke Brother 45    CVAx2 on crack    BP 110/82  Pulse 94  Temp(Src) 98.2 F (36.8 C) (Oral)  Ht 5' 9.5" (1.765 m)  Wt 176 lb (79.833 kg)  BMI 25.63 kg/m2  SpO2 92%  Review of Systems Denies LOC and weight change    Objective:   Physical Exam VITAL SIGNS:  See vs page GENERAL:  no distress   Lab Results  Component Value Date   HGBA1C 7.4* 06/26/2013      Assessment & Plan:  Type 2 DM: she needs increased rx.  She should try to transition off the sulfonylurea if possible Asthma: intermittent steroid rx compromises glycemic control.  When this happens, amaryl could be used short-term.

## 2013-07-04 NOTE — Patient Instructions (Addendum)
check your blood sugar once a day.  vary the time of day when you check, between before the 3 meals, and at bedtime.  also check if you have symptoms of your blood sugar being too high or too low.  please keep a record of the readings and bring it to your next appointment here.  please call us sooner if your blood sugar goes below 70, or if it stays over 200.   Please come back for a follow-up appointment in 4 months.   Please change the glimiperide to actos.  i have sent a prescription to your pharmacy.  This is a slow-acting medication, so your blood sugar may be temporarily high. This can cause swelling of the legs.  please call if this happens.

## 2013-07-22 ENCOUNTER — Other Ambulatory Visit: Payer: Self-pay | Admitting: Internal Medicine

## 2013-09-17 ENCOUNTER — Telehealth: Payer: Self-pay | Admitting: *Deleted

## 2013-09-17 DIAGNOSIS — E119 Type 2 diabetes mellitus without complications: Secondary | ICD-10-CM

## 2013-09-17 NOTE — Telephone Encounter (Signed)
Left message on machine for patient to return our call.  Patient will need a lipid panel. Lipid panel ordered.

## 2013-09-30 ENCOUNTER — Encounter: Payer: Self-pay | Admitting: Obstetrics and Gynecology

## 2013-10-11 ENCOUNTER — Other Ambulatory Visit: Payer: Self-pay

## 2013-10-11 DIAGNOSIS — Z1231 Encounter for screening mammogram for malignant neoplasm of breast: Secondary | ICD-10-CM

## 2013-11-04 ENCOUNTER — Ambulatory Visit (INDEPENDENT_AMBULATORY_CARE_PROVIDER_SITE_OTHER): Payer: Commercial Managed Care - PPO | Admitting: Endocrinology

## 2013-11-04 ENCOUNTER — Encounter: Payer: Self-pay | Admitting: Endocrinology

## 2013-11-04 VITALS — BP 132/96 | HR 88 | Temp 98.4°F | Ht 69.5 in | Wt 174.0 lb

## 2013-11-04 DIAGNOSIS — E119 Type 2 diabetes mellitus without complications: Secondary | ICD-10-CM

## 2013-11-04 LAB — HEMOGLOBIN A1C: HEMOGLOBIN A1C: 7.9 % — AB (ref 4.6–6.5)

## 2013-11-04 MED ORDER — CANAGLIFLOZIN 100 MG PO TABS: 1.0000 | ORAL_TABLET | Freq: Every day | ORAL | Status: DC

## 2013-11-04 NOTE — Progress Notes (Signed)
Subjective:    Patient ID: Alexandra [name], female    DOB: Nov 15, 1965, 48 y.o.   MRN: [id number]  HPI pt returns for f/u of type 2 DM (dx'ed 2003, on a routine blood test; she has mild if any neuropathy of the lower extremities; no associated chronic complications; she has never been on insulin; she is on 4 oral agents; she works 3rd shift).  she brings a record of her cbg's which i have reviewed today.  It varies from 52-228, but most are in the low-100's.  pt states she feels well in general, including when cbg was 53. Past Medical History  Diagnosis Date  . Diabetes mellitus   . Low back pain   . Osteoarthritis   . CTS (carpal tunnel syndrome)   . Glaucoma     ?  . Polyarthralgia   . Asthma   . Hypertension   . Vitamin B12 deficiency   . ALLERGIC RHINITIS     No past surgical history on file.  History   Social History  . Marital Status: Single    Spouse Name: N/A    Number of Children: N/A  . Years of Education: N/A   Occupational History  . 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 Smoking status: Never Smoker   . Smokeless tobacco: Not on file  . Alcohol Use: No  . Drug Use: No  . Sexual Activity: Not Currently   Other Topics Concern  . Not on file   Social History Narrative  . No narrative on file    Current Outpatient Prescriptions on File Prior to Visit  Medication Sig Dispense Refill  . albuterol (VENTOLIN HFA) 108 (90 BASE) MCG/ACT inhaler Inhale 1 puff into the lungs every 4 (four) hours as needed.  3 Inhaler  3  . bromocriptine (PARLODEL) 2.5 MG tablet Take 1 tablet (2.5 mg total) by mouth daily.  90 tablet  3  . Cholecalciferol 1000 UNITS tablet Take 1 tablet (1,000 Units total) by mouth daily.  100 tablet  3  . cyclobenzaprine (FLEXERIL) 10 MG tablet Take 1 tablet (10 mg total) by mouth 2 (two) times daily as needed.  90 tablet  1  . fluticasone (FLONASE) 50 MCG/ACT nasal spray 2 sprays by Nasal route daily.        Marland Kitchen  glucose blood (ONE TOUCH ULTRA TEST) test strip 1 each by Other route daily. And lancets 1/day 250.00  100 each  12  . hydrochlorothiazide (MICROZIDE) 12.5 MG capsule Take 1 capsule (12.5 mg total) by mouth every morning.  90 capsule  3  . ibuprofen (ADVIL,MOTRIN) 600 MG tablet Take 1 tablet (600 mg total) by mouth every 6 (six) hours as needed.  90 tablet  3  . levonorgestrel-ethinyl estradiol (AVIANE,ALESSE,LESSINA) 0.1-20 MG-MCG tablet Take 1 tablet by mouth as directed.  28 tablet  11  . loratadine (CLARITIN) 10 MG tablet Take 1 tablet (10 mg total) by mouth daily as needed. for allergies  90 tablet  3  . norethindrone (MICRONOR,CAMILA,ERRIN) 0.35 MG tablet TAKE ONE TABLET BY MOUTH ONCE DAILY  84 tablet  0  . pioglitazone (ACTOS) 45 MG tablet Take 1 tablet (45 mg total) by mouth daily.  30 tablet  11  . promethazine (PHENERGAN) 25 MG tablet Take 1 tablet (25 mg total) by mouth 2 (two) times daily as needed for nausea.  60 tablet  1  . ranitidine (ZANTAC) 300 MG tablet  Take 1 tablet (300 mg total) by mouth daily.  90 tablet  3  . Saxagliptin-Metformin (KOMBIGLYZE XR) 2.07-998 MG TB24 2 tabs each morning  180 tablet  3  . terbinafine (LAMISIL) 1 % cream Apply topically as needed.        . budesonide-formoterol (SYMBICORT) 160-4.5 MCG/ACT inhaler Inhale 2 puffs into the lungs 2 (two) times daily.  3 Inhaler  3  . zolpidem (AMBIEN) 10 MG tablet Take 0.5-1 tablets (5-10 mg total) by mouth at bedtime as needed for sleep.  90 tablet  1   No current facility-administered medications on file prior to visit.    Allergies  Allergen Reactions  . Benazepril Hcl     REACTION: cough  . Tramadol Hcl     REACTION: itching    Family History  Problem Relation Age of Onset  . Hypertension    . Diabetes Mother   . Kidney disease Mother 32    ESRD on HD  . Heart disease Mother   . Cancer Father     head and neck  . Stroke Brother 45    CVAx2 on crack    BP 132/96  Pulse 88  Temp(Src) 98.4 F  (36.9 C) (Oral)  Ht 5' 9.5" (1.765 m)  Wt 174 lb (78.926 kg)  BMI 25.34 kg/m2  SpO2 95%  Review of Systems Denies LOC and weight change    Objective:   Physical Exam Pulses: dorsalis pedis intact bilat.   Feet: no deformity. normal color and temp.  no edema Skin:  no ulcer on the feet.   Neuro: sensation is intact to touch on the feet.    Lab Results  Component Value Date   HGBA1C 7.9* 11/04/2013      Assessment & Plan:  HTN, new to me: moderate exacerbation, possibly situational. DM: mild exacerbation  Patient is advised the following: Patient Instructions  check your blood sugar once a day.  vary the time of day when you check, between before the 3 meals, and at bedtime.  also check if you have symptoms of your blood sugar being too high or too low.  please keep a record of the readings and bring it to your next appointment here.  please call us sooner if your blood sugar goes below 70, or if it stays over 200.   Please come back for a follow-up appointment in 6 months.     Please continue the same medications for your blood pressure, for now.

## 2013-11-04 NOTE — Patient Instructions (Addendum)
check your blood sugar once a day.  vary the time of day when you check, between before the 3 meals, and at bedtime.  also check if you have symptoms of your blood sugar being too high or too low.  please keep a record of the readings and bring it to your next appointment here.  please call us sooner if your blood sugar goes below 70, or if it stays over 200.   Please come back for a follow-up appointment in 6 months.     Please continue the same medications for your blood pressure, for now.

## 2013-11-18 ENCOUNTER — Ambulatory Visit
Admission: RE | Admit: 2013-11-18 | Discharge: 2013-11-18 | Disposition: A | Discharge status: Home / Self Care | Payer: Commercial Managed Care - PPO | Source: Ambulatory Visit

## 2013-11-18 DIAGNOSIS — Z1231 Encounter for screening mammogram for malignant neoplasm of breast: Secondary | ICD-10-CM

## 2013-12-27 ENCOUNTER — Ambulatory Visit (INDEPENDENT_AMBULATORY_CARE_PROVIDER_SITE_OTHER): Payer: Commercial Managed Care - PPO | Admitting: Internal Medicine

## 2013-12-27 ENCOUNTER — Other Ambulatory Visit (INDEPENDENT_AMBULATORY_CARE_PROVIDER_SITE_OTHER): Payer: Commercial Managed Care - PPO

## 2013-12-27 ENCOUNTER — Encounter: Payer: Self-pay | Admitting: Internal Medicine

## 2013-12-27 VITALS — BP 118/80 | HR 76 | Temp 98.4°F | Resp 16 | Wt 167.0 lb

## 2013-12-27 DIAGNOSIS — E119 Type 2 diabetes mellitus without complications: Secondary | ICD-10-CM

## 2013-12-27 DIAGNOSIS — E538 Deficiency of other specified B group vitamins: Secondary | ICD-10-CM

## 2013-12-27 DIAGNOSIS — Z23 Encounter for immunization: Secondary | ICD-10-CM

## 2013-12-27 DIAGNOSIS — J4520 Mild intermittent asthma, uncomplicated: Secondary | ICD-10-CM

## 2013-12-27 DIAGNOSIS — R252 Cramp and spasm: Secondary | ICD-10-CM

## 2013-12-27 LAB — LIPID PANEL
Cholesterol: 166 mg/dL (ref 0–200)
HDL: 39.8 mg/dL (ref >39.00)
LDL Cholesterol: 115 mg/dL — ABNORMAL HIGH (ref 0–99)
NONHDL: 126.2
TRIGLYCERIDES: 56 mg/dL (ref 0.0–149.0)
Total CHOL/HDL Ratio: 4
VLDL: 11.2 mg/dL (ref 0.0–40.0)

## 2013-12-27 LAB — BASIC METABOLIC PANEL
BUN: 17 mg/dL (ref 6–23)
CHLORIDE: 105 meq/L (ref 96–112)
CO2: 30 meq/L (ref 19–32)
CREATININE: 0.7 mg/dL (ref 0.4–1.2)
Calcium: 9.5 mg/dL (ref 8.4–10.5)
GFR: 107.84 mL/min (ref >60.00)
Glucose, Bld: 120 mg/dL — ABNORMAL HIGH (ref 70–99)
POTASSIUM: 4.2 meq/L (ref 3.5–5.1)
Sodium: 140 mEq/L (ref 135–145)

## 2013-12-27 LAB — HEMOGLOBIN A1C: Hgb A1c MFr Bld: 7.1 % — ABNORMAL HIGH (ref 4.6–6.5)

## 2013-12-27 MED ORDER — HYDROCHLOROTHIAZIDE 12.5 MG PO CAPS: 12.5000 mg | ORAL_CAPSULE | ORAL | Status: DC

## 2013-12-27 MED ORDER — RANITIDINE HCL 300 MG PO TABS: 300.0000 mg | ORAL_TABLET | Freq: Every day | ORAL | Status: DC

## 2013-12-27 MED ORDER — CYCLOBENZAPRINE HCL 10 MG PO TABS: 10.0000 mg | ORAL_TABLET | Freq: Two times a day (BID) | ORAL | Status: DC | PRN

## 2013-12-27 MED ORDER — LORATADINE 10 MG PO TABS: 10.0000 mg | ORAL_TABLET | Freq: Every day | ORAL | Status: DC | PRN

## 2013-12-27 NOTE — Assessment & Plan Note (Signed)
Resolved

## 2013-12-27 NOTE — Assessment & Plan Note (Signed)
Continue with current prn prescription therapy as reflected on the Med list.  

## 2013-12-27 NOTE — Progress Notes (Signed)
   Subjective:    HPI  Dr Ronnald Ramp did the surgery Oct 2014. Now is back to old job. 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 No LBP now   The patient presents for a follow-up of  chronic hypertension, chronic dyslipidemia, asthma,  type 2 diabetes controlled with medicines.   She is seeing Dr Loanne Drilling.   BP Readings from Last 3 Encounters:  12/27/13 118/80  11/04/13 132/96  07/04/13 110/82   Wt Readings from Last 3 Encounters:  12/27/13 167 lb (75.751 kg)  11/04/13 174 lb (78.926 kg)  07/04/13 176 lb (79.833 kg)     Review of Systems  Constitutional: Negative for chills, activity change, appetite change, fatigue and unexpected weight change.  HENT: Negative for congestion, mouth sores and sinus pressure.   Eyes: Negative for visual disturbance.  Respiratory: Negative for cough and chest tightness.   Gastrointestinal: Negative for nausea.  Genitourinary: Negative for frequency, difficulty urinating and vaginal pain.  Musculoskeletal: Negative for gait problem and joint swelling.  Skin: Negative for pallor and rash.  Neurological: Negative for dizziness and tremors.  Psychiatric/Behavioral: Negative for confusion and sleep disturbance.       Objective:   Physical Exam  Constitutional: She appears well-developed and well-nourished.  HENT:  Head: Normocephalic.  Right Ear: External ear normal.  Left Ear: External ear normal.  Nose: Nose normal.  Mouth/Throat: Oropharynx is clear and moist.  Eyes: Conjunctivae are normal. Pupils are equal, round, and reactive to light. Right eye exhibits no discharge. Left eye exhibits no discharge.  Neck: Normal range of motion. Neck supple. No JVD present. No tracheal deviation present. No thyromegaly present.  Cardiovascular: Normal rate, regular rhythm and normal heart sounds.   Pulmonary/Chest: No stridor. No respiratory distress. She has no wheezes.  Abdominal: Soft. Bowel sounds are normal. She exhibits no  distension and no mass. There is no tenderness. There is no rebound and no guarding.  Musculoskeletal: She exhibits no edema and no tenderness.  LS is a little tender str leg elev (-) R  Lymphadenopathy:    She has no cervical adenopathy.  Neurological: She displays normal reflexes. No cranial nerve deficit. She exhibits normal muscle tone. Coordination normal.  Skin: No rash noted. No erythema.  Psychiatric: She has a normal mood and affect. Her behavior is normal. Judgment and thought content normal.    Lab Results  Component Value Date   WBC 8.4 09/16/2011   HGB 12.1 09/16/2011   HCT 37.5 09/16/2011   PLT 311.0 09/16/2011   GLUCOSE 102* 06/26/2013   CHOL 192 09/16/2011   TRIG 126.0 09/16/2011   HDL 44.30 09/16/2011   LDLDIRECT 122.0 03/03/2008   LDLCALC 123* 09/16/2011   ALT 23 09/16/2011   AST 26 09/16/2011   NA 140 06/26/2013   K 4.3 06/26/2013   CL 103 06/26/2013   CREATININE 0.6 06/26/2013   BUN 12 06/26/2013   CO2 28 06/26/2013   TSH 3.01 05/21/2012   HGBA1C 7.9* 11/04/2013   MICROALBUR 0.2 03/05/2013         Assessment & Plan:

## 2013-12-27 NOTE — Assessment & Plan Note (Signed)
Continue with current prescription therapy as reflected on the Med list.  

## 2013-12-27 NOTE — Progress Notes (Signed)
Pre visit review using our clinic review tool, if applicable. No additional management support is needed unless otherwise documented below in the visit note. 

## 2013-12-27 NOTE — Assessment & Plan Note (Signed)
Stop HCTZ if cramping too much

## 2013-12-27 NOTE — Patient Instructions (Addendum)
Stop HCTZ if cramping too much, please  Wt Readings from Last 3 Encounters:  12/27/13 167 lb (75.751 kg)  11/04/13 174 lb (78.926 kg)  07/04/13 176 lb (79.833 kg)

## 2013-12-27 NOTE — Assessment & Plan Note (Signed)
Continue with current prescription therapy as reflected on the Med list. On Invokana now too

## 2014-02-19 ENCOUNTER — Ambulatory Visit (INDEPENDENT_AMBULATORY_CARE_PROVIDER_SITE_OTHER): Payer: Commercial Managed Care - PPO | Admitting: Endocrinology

## 2014-02-19 ENCOUNTER — Telehealth: Payer: Self-pay

## 2014-02-19 ENCOUNTER — Encounter: Payer: Self-pay | Admitting: Endocrinology

## 2014-02-19 VITALS — BP 122/80 | HR 78 | Temp 97.9°F | Ht 69.5 in | Wt 164.0 lb

## 2014-02-19 DIAGNOSIS — E119 Type 2 diabetes mellitus without complications: Secondary | ICD-10-CM

## 2014-02-19 MED ORDER — CANAGLIFLOZIN 300 MG PO TABS: 300.0000 mg | ORAL_TABLET | Freq: Every day | ORAL | Status: DC

## 2014-02-19 MED ORDER — LINAGLIPTIN-METFORMIN HCL 2.5-1000 MG PO TABS: 1.0000 | ORAL_TABLET | Freq: Two times a day (BID) | ORAL | Status: DC

## 2014-02-19 NOTE — Patient Instructions (Addendum)
check your blood sugar once a day.  vary the time of day when you check, between before the 3 meals, and at bedtime.  also check if you have symptoms of your blood sugar being too high or too low.  please keep a record of the readings and bring it to your next appointment here.  please call us sooner if your blood sugar goes below 70, or if it stays over 200.  Please increase the invokana to 300 mg day. i have sent a prescription to your pharmacy.   Please come back for a follow-up appointment in 2 months.    Please continue your weight-loss efforts.

## 2014-02-19 NOTE — Progress Notes (Signed)
Subjective:    Patient ID: Alexandra [name], female    DOB: Aug 23, 1965, 48 y.o.   MRN: [id number]  HPI  Pt returns for f/u of diabetes mellitus: DM type: 2 Dx'ed: 5726 Complications: none Therapy: 4 oral agents GDM: never DKA: never Severe hypoglycemia: never Pancreatitis: never Other: she has never been on insulin; she works 3rd shift Interval history: no cbg record, but states cbg's are in the mid-100's.  There is no trend throughout the day.  pt states she feels well in general.  Past Medical History  Diagnosis Date  . Diabetes mellitus   . Low back pain   . Osteoarthritis   . CTS (carpal tunnel syndrome)   . Glaucoma     ?  . Polyarthralgia   . Asthma   . Hypertension   . Vitamin B12 deficiency   . ALLERGIC RHINITIS     No past surgical history on file.  History   Social History  . Marital Status: Single    Spouse Name: N/A    Number of Children: N/A  . Years of Education: N/A   Occupational History  . 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 Smoking status: Never Smoker   . Smokeless tobacco: Not on file  . Alcohol Use: No  . Drug Use: No  . Sexual Activity: Not Currently   Other Topics Concern  . Not on file   Social History Narrative    Current Outpatient Prescriptions on File Prior to Visit  Medication Sig Dispense Refill  . albuterol (VENTOLIN HFA) 108 (90 BASE) MCG/ACT inhaler Inhale 1 puff into the lungs every 4 (four) hours as needed. 3 Inhaler 3  . Cholecalciferol 1000 UNITS tablet Take 1 tablet (1,000 Units total) by mouth daily. 100 tablet 3  . fluticasone (FLONASE) 50 MCG/ACT nasal spray 2 sprays by Nasal route daily.      Marland Kitchen glucose blood (ONE TOUCH ULTRA TEST) test strip 1 each by Other route daily. And lancets 1/day 250.00 100 each 12  . hydrochlorothiazide (MICROZIDE) 12.5 MG capsule Take 1 capsule (12.5 mg total) by mouth every morning. 90 capsule 3  . ibuprofen (ADVIL,MOTRIN) 600 MG tablet Take 1 tablet  (600 mg total) by mouth every 6 (six) hours as needed. 90 tablet 3  . levonorgestrel-ethinyl estradiol (AVIANE,ALESSE,LESSINA) 0.1-20 MG-MCG tablet Take 1 tablet by mouth as directed. 28 tablet 11  . loratadine (CLARITIN) 10 MG tablet Take 1 tablet (10 mg total) by mouth daily as needed. for allergies 90 tablet 3  . norethindrone (MICRONOR,CAMILA,ERRIN) 0.35 MG tablet TAKE ONE TABLET BY MOUTH ONCE DAILY 84 tablet 0  . pioglitazone (ACTOS) 45 MG tablet Take 1 tablet (45 mg total) by mouth daily. 30 tablet 11  . promethazine (PHENERGAN) 25 MG tablet Take 1 tablet (25 mg total) by mouth 2 (two) times daily as needed for nausea. 60 tablet 1  . ranitidine (ZANTAC) 300 MG tablet Take 1 tablet (300 mg total) by mouth daily. 90 tablet 3  . terbinafine (LAMISIL) 1 % cream Apply topically as needed.      . bromocriptine (PARLODEL) 2.5 MG tablet Take 1 tablet (2.5 mg total) by mouth daily. (Patient not taking: Reported on 02/19/2014) 90 tablet 3  . budesonide-formoterol (SYMBICORT) 160-4.5 MCG/ACT inhaler Inhale 2 puffs into the lungs 2 (two) times daily. 3 Inhaler 3  . cyclobenzaprine (FLEXERIL) 10 MG tablet Take 1 tablet (10 mg total) by mouth 2 (two)  times daily as needed. (Patient not taking: Reported on 02/19/2014) 90 tablet 1  . zolpidem (AMBIEN) 10 MG tablet Take 0.5-1 tablets (5-10 mg total) by mouth at bedtime as needed for sleep. 90 tablet 1   No current facility-administered medications on file prior to visit.    Allergies  Allergen Reactions  . Benazepril Hcl     REACTION: cough  . Tramadol Hcl     REACTION: itching    Family History  Problem Relation Age of Onset  . Hypertension    . Diabetes Mother   . Kidney disease Mother 1    ESRD on HD  . Heart disease Mother   . Cancer Father     head and neck  . Stroke Brother 45    CVAx2 on crack    BP 122/80 mmHg  Pulse 78  Temp(Src) 97.9 F (36.6 C) (Oral)  Ht 5' 9.5" (1.765 m)  Wt 164 lb (74.39 kg)  BMI 23.88 kg/m2  SpO2  97%    Review of Systems She denies hypoglycemia.  She has lost a few lbs    Objective:   Physical Exam VITAL SIGNS:  See vs page GENERAL: no distress Pulses: dorsalis pedis intact bilat.   Feet: no deformity.  no edema Skin:  no ulcer on the feet.  normal color and temp. Neuro: sensation is intact to touch on the feet.   Lab Results  Component Value Date   HGBA1C 7.1* 12/27/2013       Assessment & Plan:  DM: Needs increased rx, if it can be done with a regimen that avoids or minimizes hypoglycemia. Obesity: improved   Patient is advised the following: Patient Instructions  check your blood sugar once a day.  vary the time of day when you check, between before the 3 meals, and at bedtime.  also check if you have symptoms of your blood sugar being too high or too low.  please keep a record of the readings and bring it to your next appointment here.  please call us sooner if your blood sugar goes below 70, or if it stays over 200.  Please increase the invokana to 300 mg day. i have sent a prescription to your pharmacy.   Please come back for a follow-up appointment in 2 months.    Please continue your weight-loss efforts.

## 2014-02-19 NOTE — Telephone Encounter (Signed)
Received form from pt's insurance stating the [facility]avia is note covered my insurance. Alternatives are Janumet and Kombiglyze.  Please advise, Thanks!

## 2014-02-24 ENCOUNTER — Telehealth: Payer: Self-pay | Admitting: Endocrinology

## 2014-02-24 NOTE — Telephone Encounter (Signed)
Patient stated that the pharmacy haven't received meds Jentadueto, please resend.

## 2014-03-06 ENCOUNTER — Telehealth: Payer: Self-pay | Admitting: Endocrinology

## 2014-03-06 NOTE — Telephone Encounter (Signed)
Patient came into the office this afternoon wanting to know if there was a generic medication to the [facility]?  Her insurance will not cover this    Please advise   Thank you

## 2014-03-07 MED ORDER — SITAGLIPTIN PHOS-METFORMIN HCL 50-1000 MG PO TABS: 1.0000 | ORAL_TABLET | Freq: Two times a day (BID) | ORAL | Status: DC

## 2014-03-07 NOTE — Telephone Encounter (Signed)
Pt advise of medication via voice mail. Rx sent for Januiva. Requested call back if pt would like to discuss.

## 2014-03-07 NOTE — Telephone Encounter (Signed)
See below. I contacted insurance company preferred alternatives are [place].  Please advise during Dr. Cordelia Pen absence.  Thanks!

## 2014-03-07 NOTE — Telephone Encounter (Signed)
See below, Thanks!  

## 2014-03-07 NOTE — Telephone Encounter (Signed)
Janumet 50/1000 twice a day

## 2014-03-10 ENCOUNTER — Other Ambulatory Visit: Payer: Self-pay | Admitting: Internal Medicine

## 2014-04-29 ENCOUNTER — Ambulatory Visit: Payer: Commercial Managed Care - PPO | Admitting: Internal Medicine

## 2014-05-01 ENCOUNTER — Encounter: Payer: Self-pay | Admitting: Internal Medicine

## 2014-05-01 ENCOUNTER — Ambulatory Visit (INDEPENDENT_AMBULATORY_CARE_PROVIDER_SITE_OTHER): Payer: Commercial Managed Care - PPO | Admitting: Internal Medicine

## 2014-05-01 ENCOUNTER — Other Ambulatory Visit (INDEPENDENT_AMBULATORY_CARE_PROVIDER_SITE_OTHER): Payer: Commercial Managed Care - PPO

## 2014-05-01 VITALS — BP 116/90 | HR 77 | Temp 98.4°F | Wt 161.0 lb

## 2014-05-01 DIAGNOSIS — E876 Hypokalemia: Secondary | ICD-10-CM

## 2014-05-01 DIAGNOSIS — E119 Type 2 diabetes mellitus without complications: Secondary | ICD-10-CM | POA: Insufficient documentation

## 2014-05-01 DIAGNOSIS — E1169 Type 2 diabetes mellitus with other specified complication: Secondary | ICD-10-CM

## 2014-05-01 DIAGNOSIS — E538 Deficiency of other specified B group vitamins: Secondary | ICD-10-CM

## 2014-05-01 DIAGNOSIS — I10 Essential (primary) hypertension: Secondary | ICD-10-CM

## 2014-05-01 LAB — BASIC METABOLIC PANEL
BUN: 17 mg/dL (ref 6–23)
CHLORIDE: 102 meq/L (ref 96–112)
CO2: 32 mEq/L (ref 19–32)
Calcium: 9.8 mg/dL (ref 8.4–10.5)
Creatinine, Ser: 0.72 mg/dL (ref 0.40–1.20)
GFR: 111.14 mL/min (ref >60.00)
GLUCOSE: 104 mg/dL — AB (ref 70–99)
POTASSIUM: 4.2 meq/L (ref 3.5–5.1)
Sodium: 140 mEq/L (ref 135–145)

## 2014-05-01 LAB — HEMOGLOBIN A1C: Hgb A1c MFr Bld: 6.9 % — ABNORMAL HIGH (ref 4.6–6.5)

## 2014-05-01 NOTE — Assessment & Plan Note (Signed)
Continue with current prescription therapy as reflected on the Med list. Labs  

## 2014-05-01 NOTE — Progress Notes (Signed)
   Subjective:    HPI   The patient presents for a follow-up of  chronic hypertension, chronic dyslipidemia, asthma,  type 2 diabetes controlled with medicines.   She is seeing Dr Loanne Drilling.   Dr Ronnald Ramp did the surgery Oct 2014. Now is back to old job. 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 No LBP now     BP Readings from Last 3 Encounters:  05/01/14 116/90  02/19/14 122/80  12/27/13 118/80   Wt Readings from Last 3 Encounters:  05/01/14 161 lb (73.029 kg)  02/19/14 164 lb (74.39 kg)  12/27/13 167 lb (75.751 kg)     Review of Systems  Constitutional: Negative for chills, activity change, appetite change, fatigue and unexpected weight change.  HENT: Negative for congestion, mouth sores and sinus pressure.   Eyes: Negative for visual disturbance.  Respiratory: Negative for cough and chest tightness.   Gastrointestinal: Negative for nausea.  Genitourinary: Negative for frequency, difficulty urinating and vaginal pain.  Musculoskeletal: Negative for joint swelling and gait problem.  Skin: Negative for pallor and rash.  Neurological: Negative for dizziness and tremors.  Psychiatric/Behavioral: Negative for confusion and sleep disturbance.       Objective:   Physical Exam  Constitutional: She appears well-developed and well-nourished.  HENT:  Head: Normocephalic.  Right Ear: External ear normal.  Left Ear: External ear normal.  Nose: Nose normal.  Mouth/Throat: Oropharynx is clear and moist.  Eyes: Conjunctivae are normal. Pupils are equal, round, and reactive to light. Right eye exhibits no discharge. Left eye exhibits no discharge.  Neck: Normal range of motion. Neck supple. No JVD present. No tracheal deviation present. No thyromegaly present.  Cardiovascular: Normal rate, regular rhythm and normal heart sounds.   Pulmonary/Chest: No stridor. No respiratory distress. She has no wheezes.  Abdominal: Soft. Bowel sounds are normal. She exhibits no  distension and no mass. There is no tenderness. There is no rebound and no guarding.  Musculoskeletal: She exhibits no edema or tenderness.  LS is a little tender str leg elev (-) R  Lymphadenopathy:    She has no cervical adenopathy.  Neurological: She displays normal reflexes. No cranial nerve deficit. She exhibits normal muscle tone. Coordination normal.  Skin: No rash noted. No erythema.  Psychiatric: She has a normal mood and affect. Her behavior is normal. Judgment and thought content normal.    Lab Results  Component Value Date   WBC 8.4 09/16/2011   HGB 12.1 09/16/2011   HCT 37.5 09/16/2011   PLT 311.0 09/16/2011   GLUCOSE 120* 12/27/2013   CHOL 166 12/27/2013   TRIG 56.0 12/27/2013   HDL 39.80 12/27/2013   LDLDIRECT 122.0 03/03/2008   LDLCALC 115* 12/27/2013   ALT 23 09/16/2011   AST 26 09/16/2011   NA 140 12/27/2013   K 4.2 12/27/2013   CL 105 12/27/2013   CREATININE 0.7 12/27/2013   BUN 17 12/27/2013   CO2 30 12/27/2013   TSH 3.01 05/21/2012   HGBA1C 7.1* 12/27/2013   MICROALBUR 0.2 03/05/2013         Assessment & Plan:

## 2014-05-01 NOTE — Assessment & Plan Note (Signed)
Continue with current prescription therapy as reflected on the Med list.  

## 2014-05-01 NOTE — Assessment & Plan Note (Signed)
BMET 

## 2014-05-01 NOTE — Progress Notes (Signed)
Pre visit review using our clinic review tool, if applicable. No additional management support is needed unless otherwise documented below in the visit note. 

## 2014-05-02 ENCOUNTER — Telehealth: Payer: Self-pay | Admitting: Internal Medicine

## 2014-05-02 NOTE — Telephone Encounter (Signed)
emmi emailed °

## 2014-05-06 ENCOUNTER — Ambulatory Visit (INDEPENDENT_AMBULATORY_CARE_PROVIDER_SITE_OTHER): Payer: Commercial Managed Care - PPO | Admitting: Endocrinology

## 2014-05-06 ENCOUNTER — Other Ambulatory Visit: Payer: Self-pay

## 2014-05-06 ENCOUNTER — Encounter: Payer: Self-pay | Admitting: Endocrinology

## 2014-05-06 VITALS — BP 126/78 | HR 93 | Temp 97.6°F | Ht 69.5 in | Wt 158.0 lb

## 2014-05-06 DIAGNOSIS — E119 Type 2 diabetes mellitus without complications: Secondary | ICD-10-CM

## 2014-05-06 LAB — MICROALBUMIN / CREATININE URINE RATIO
CREATININE, U: 82.9 mg/dL
Microalb Creat Ratio: 0.8 mg/g (ref 0.0–30.0)
Microalb, Ur: 0.7 mg/dL (ref 0.0–1.9)

## 2014-05-06 MED ORDER — CANAGLIFLOZIN 300 MG PO TABS: 300.0000 mg | ORAL_TABLET | Freq: Every day | ORAL | Status: DC

## 2014-05-06 MED ORDER — BROMOCRIPTINE MESYLATE 2.5 MG PO TABS: 2.5000 mg | ORAL_TABLET | Freq: Every day | ORAL | Status: DC

## 2014-05-06 MED ORDER — SITAGLIPTIN PHOS-METFORMIN HCL 50-1000 MG PO TABS: 1.0000 | ORAL_TABLET | Freq: Two times a day (BID) | ORAL | Status: DC

## 2014-05-06 MED ORDER — PIOGLITAZONE HCL 45 MG PO TABS: 45.0000 mg | ORAL_TABLET | Freq: Every day | ORAL | Status: DC

## 2014-05-06 NOTE — Progress Notes (Signed)
Subjective:    Patient ID: Alexandra [name], female    DOB: [birth year], 49 y.o.   MRN: [id number]  HPI Pt returns for f/u of diabetes mellitus: DM type: 2 Dx'ed: 8768 Complications: none Therapy: 5 oral meds GDM: never DKA: never Severe hypoglycemia: never Pancreatitis: never Other: she has never been on insulin; she works 3rd shift Interval history: no cbg record, but states cbg's are in the mid-100's.  There is no trend throughout the day.  pt states she feels well in general. Pt states moderate weight loss, throughout the body, but no assoc brbpr.  She is a nonsmoker.  Past Medical History  Diagnosis Date  . Diabetes mellitus   . Low back pain   . Osteoarthritis   . CTS (carpal tunnel syndrome)   . Glaucoma     ?  . Polyarthralgia   . Asthma   . Hypertension   . Vitamin B12 deficiency   . ALLERGIC RHINITIS     No past surgical history on file.  History   Social History  . Marital Status: Single    Spouse Name: N/A  . Number of Children: N/A  . Years of Education: N/A   Occupational History  . 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 Smoking status: Never Smoker   . Smokeless tobacco: Not on file  . Alcohol Use: No  . Drug Use: No  . Sexual Activity: Not Currently   Other Topics Concern  . Not on file   Social History Narrative    Current Outpatient Prescriptions on File Prior to Visit  Medication Sig Dispense Refill  . albuterol (VENTOLIN HFA) 108 (90 BASE) MCG/ACT inhaler Inhale 1 puff into the lungs every 4 (four) hours as needed. 3 Inhaler 3  . Cholecalciferol 1000 UNITS tablet Take 1 tablet (1,000 Units total) by mouth daily. 100 tablet 3  . cyclobenzaprine (FLEXERIL) 10 MG tablet Take 1 tablet (10 mg total) by mouth 2 (two) times daily as needed. 90 tablet 1  . fluticasone (FLONASE) 50 MCG/ACT nasal spray 2 sprays by Nasal route daily.      . Ginkgo Biloba 60 MG CAPS Take 2 capsules by mouth daily.    Marland Kitchen glucose blood  (ONE TOUCH ULTRA TEST) test strip 1 each by Other route daily. And lancets 1/day 250.00 100 each 12  . hydrochlorothiazide (MICROZIDE) 12.5 MG capsule Take 1 capsule (12.5 mg total) by mouth every morning. 90 capsule 3  . ibuprofen (ADVIL,MOTRIN) 600 MG tablet Take 1 tablet (600 mg total) by mouth every 6 (six) hours as needed. 90 tablet 3  . levonorgestrel-ethinyl estradiol (AVIANE,ALESSE,LESSINA) 0.1-20 MG-MCG tablet Take 1 tablet by mouth as directed. 28 tablet 11  . loratadine (CLARITIN) 10 MG tablet Take 1 tablet (10 mg total) by mouth daily as needed. for allergies 90 tablet 3  . norethindrone (MICRONOR,CAMILA,ERRIN) 0.35 MG tablet TAKE ONE TABLET BY MOUTH ONCE DAILY 84 tablet 0  . promethazine (PHENERGAN) 25 MG tablet Take 1 tablet (25 mg total) by mouth 2 (two) times daily as needed for nausea. 60 tablet 1  . ranitidine (ZANTAC) 300 MG tablet Take 1 tablet (300 mg total) by mouth daily. 90 tablet 3  . SYMBICORT 160-4.5 MCG/ACT inhaler INHALE TWO PUFFS BY MOUTH TWICE DAILY 10.2 g 1  . terbinafine (LAMISIL) 1 % cream Apply topically as needed.      . budesonide-formoterol (SYMBICORT) 160-4.5 MCG/ACT inhaler Inhale 2 puffs into  the lungs 2 (two) times daily. 3 Inhaler 3  . zolpidem (AMBIEN) 10 MG tablet Take 0.5-1 tablets (5-10 mg total) by mouth at bedtime as needed for sleep. 90 tablet 1   No current facility-administered medications on file prior to visit.    Allergies  Allergen Reactions  . Benazepril Hcl     REACTION: cough  . Tramadol Hcl     REACTION: itching    Family History  Problem Relation Age of Onset  . Hypertension    . Diabetes Mother   . Kidney disease Mother 62    ESRD on HD  . Heart disease Mother   . Cancer Father     head and neck  . Stroke Brother 45    CVAx2 on crack    BP 126/78 mmHg  Pulse 93  Temp(Src) 97.6 F (36.4 C) (Oral)  Ht 5' 9.5" (1.765 m)  Wt 158 lb (71.668 kg)  BMI 23.01 kg/m2  SpO2 97%   Review of Systems Denies hematuria and  abd pain.    Objective:   Physical Exam VITAL SIGNS:  See vs page GENERAL: no distress Pulses: dorsalis pedis intact bilat.   MSK: no deformity of the feet CV: no leg edema Skin:  no ulcer on the feet.  normal color and temp on the feet. Neuro: sensation is intact to touch on the feet   Lab Results  Component Value Date   HGBA1C 6.9* 05/01/2014       Assessment & Plan:  Weight loss, new to me, uncertain etiology DM: well-controlled    Patient is advised the following: Patient Instructions  check your blood sugar once a day.  vary the time of day when you check, between before the 3 meals, and at bedtime.  also check if you have symptoms of your blood sugar being too high or too low.  please keep a record of the readings and bring it to your next appointment here.  please call us sooner if your blood sugar goes below 70, or if it stays over 200.  A diabetes urine test is requested for you today.  We'll let you know about the results. Please come back for a follow-up appointment in 3 months.   here are some tests for blood in the bowels.  please follow the instructions, and return to the lab, here or at Dr Plotnikov's office.   Please make an appointment with Dr Alain Marion if the weight loss persists.

## 2014-05-06 NOTE — Patient Instructions (Addendum)
check your blood sugar once a day.  vary the time of day when you check, between before the 3 meals, and at bedtime.  also check if you have symptoms of your blood sugar being too high or too low.  please keep a record of the readings and bring it to your next appointment here.  please call us sooner if your blood sugar goes below 70, or if it stays over 200.  A diabetes urine test is requested for you today.  We'll let you know about the results. Please come back for a follow-up appointment in 3 months.   here are some tests for blood in the bowels.  please follow the instructions, and return to the lab, here or at Dr Plotnikov's office.   Please make an appointment with Dr Alain Marion if the weight loss persists.

## 2014-05-07 ENCOUNTER — Ambulatory Visit: Payer: Commercial Managed Care - PPO | Admitting: Endocrinology

## 2014-08-04 ENCOUNTER — Ambulatory Visit: Payer: Commercial Managed Care - PPO | Admitting: Endocrinology

## 2014-08-06 ENCOUNTER — Encounter: Payer: Self-pay | Admitting: Endocrinology

## 2014-08-06 ENCOUNTER — Ambulatory Visit (INDEPENDENT_AMBULATORY_CARE_PROVIDER_SITE_OTHER): Payer: Commercial Managed Care - PPO | Admitting: Endocrinology

## 2014-08-06 VITALS — BP 108/60 | HR 74 | Temp 97.9°F | Ht 69.5 in | Wt 151.0 lb

## 2014-08-06 DIAGNOSIS — E119 Type 2 diabetes mellitus without complications: Secondary | ICD-10-CM | POA: Diagnosis not present

## 2014-08-06 LAB — HEMOGLOBIN A1C: HEMOGLOBIN A1C: 6.5 % (ref 4.6–6.5)

## 2014-08-06 MED ORDER — CANAGLIFLOZIN 100 MG PO TABS: 100.0000 mg | ORAL_TABLET | Freq: Every day | ORAL | Status: DC

## 2014-08-06 NOTE — Patient Instructions (Addendum)
check your blood sugar once a day.  vary the time of day when you check, between before the 3 meals, and at bedtime.  also check if you have symptoms of your blood sugar being too high or too low.  please keep a record of the readings and bring it to your next appointment here.  please call us sooner if your blood sugar goes below 70, or if it stays over 200.  blood tests are requested for you today.  We'll let you know about the results.  If it is good, we'll decrease or stop the invokana.  Please come back for a follow-up appointment in 3 months.

## 2014-08-06 NOTE — Progress Notes (Signed)
Subjective:    Patient ID: Alexandra [name], female    DOB: 1965-11-08, 49 y.o.   MRN: [id number]  HPI Pt returns for f/u of diabetes mellitus: DM type: 2 Dx'ed: 0071 Complications: none Therapy: 5 oral meds GDM: never DKA: never Severe hypoglycemia: never Pancreatitis: never Other: she has never been on insulin; she works 3rd shift Interval history: she does not check cbg's.  She denies hypoglycemia.   Past Medical History  Diagnosis Date  . Diabetes mellitus   . Low back pain   . Osteoarthritis   . CTS (carpal tunnel syndrome)   . Glaucoma     ?  . Polyarthralgia   . Asthma   . Hypertension   . Vitamin B12 deficiency   . ALLERGIC RHINITIS     No past surgical history on file.  History   Social History  . Marital Status: Single    Spouse Name: N/A  . Number of Children: N/A  . Years of Education: N/A   Occupational History  . 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 Smoking status: Never Smoker   . Smokeless tobacco: Not on file  . Alcohol Use: No  . Drug Use: No  . Sexual Activity: Not Currently   Other Topics Concern  . Not on file   Social History Narrative    Current Outpatient Prescriptions on File Prior to Visit  Medication Sig Dispense Refill  . albuterol (VENTOLIN HFA) 108 (90 BASE) MCG/ACT inhaler Inhale 1 puff into the lungs every 4 (four) hours as needed. 3 Inhaler 3  . bromocriptine (PARLODEL) 2.5 MG tablet Take 1 tablet (2.5 mg total) by mouth daily. 90 tablet 3  . Cholecalciferol 1000 UNITS tablet Take 1 tablet (1,000 Units total) by mouth daily. 100 tablet 3  . cyclobenzaprine (FLEXERIL) 10 MG tablet Take 1 tablet (10 mg total) by mouth 2 (two) times daily as needed. 90 tablet 1  . fluticasone (FLONASE) 50 MCG/ACT nasal spray 2 sprays by Nasal route daily.      . Ginkgo Biloba 60 MG CAPS Take 2 capsules by mouth daily.    Marland Kitchen glucose blood (ONE TOUCH ULTRA TEST) test strip 1 each by Other route daily. And  lancets 1/day 250.00 100 each 12  . hydrochlorothiazide (MICROZIDE) 12.5 MG capsule Take 1 capsule (12.5 mg total) by mouth every morning. 90 capsule 3  . ibuprofen (ADVIL,MOTRIN) 600 MG tablet Take 1 tablet (600 mg total) by mouth every 6 (six) hours as needed. 90 tablet 3  . levonorgestrel-ethinyl estradiol (AVIANE,ALESSE,LESSINA) 0.1-20 MG-MCG tablet Take 1 tablet by mouth as directed. 28 tablet 11  . loratadine (CLARITIN) 10 MG tablet Take 1 tablet (10 mg total) by mouth daily as needed. for allergies 90 tablet 3  . norethindrone (MICRONOR,CAMILA,ERRIN) 0.35 MG tablet TAKE ONE TABLET BY MOUTH ONCE DAILY 84 tablet 0  . pioglitazone (ACTOS) 45 MG tablet Take 1 tablet (45 mg total) by mouth daily. 90 tablet 2  . promethazine (PHENERGAN) 25 MG tablet Take 1 tablet (25 mg total) by mouth 2 (two) times daily as needed for nausea. 60 tablet 1  . ranitidine (ZANTAC) 300 MG tablet Take 1 tablet (300 mg total) by mouth daily. 90 tablet 3  . sitaGLIPtin-metformin (JANUMET) 50-1000 MG per tablet Take 1 tablet by mouth 2 (two) times daily with a meal. 180 tablet 2  . SYMBICORT 160-4.5 MCG/ACT inhaler INHALE TWO PUFFS BY MOUTH TWICE DAILY 10.2  g 1  . terbinafine (LAMISIL) 1 % cream Apply topically as needed.      . budesonide-formoterol (SYMBICORT) 160-4.5 MCG/ACT inhaler Inhale 2 puffs into the lungs 2 (two) times daily. 3 Inhaler 3  . zolpidem (AMBIEN) 10 MG tablet Take 0.5-1 tablets (5-10 mg total) by mouth at bedtime as needed for sleep. 90 tablet 1   No current facility-administered medications on file prior to visit.    Allergies  Allergen Reactions  . Benazepril Hcl     REACTION: cough  . Tramadol Hcl     REACTION: itching    Family History  Problem Relation Age of Onset  . Hypertension    . Diabetes Mother   . Kidney disease Mother 54    ESRD on HD  . Heart disease Mother   . Cancer Father     head and neck  . Stroke Brother 45    CVAx2 on crack    BP 108/60 mmHg  Pulse 74   Temp(Src) 97.9 F (36.6 C) (Oral)  Ht 5' 9.5" (1.765 m)  Wt 151 lb (68.493 kg)  BMI 21.99 kg/m2  SpO2 98%    Review of Systems She continues to lose weight (she feels due to invokana)  Her appetite is good.     Objective:   Physical Exam VITAL SIGNS:  See vs page GENERAL: no distress Pulses: dorsalis pedis intact bilat.   MSK: no deformity of the feet CV: no leg edema Skin:  no ulcer on the feet.  normal color and temp on the feet. Neuro: sensation is intact to touch on the feet.   Lab Results  Component Value Date   HGBA1C 6.5 08/06/2014       Assessment & Plan:  DM: well-controlled Polyuria, new, prob due to invokana Weight loss, prob due to invokana.  We'll follow.   Patient is advised the following: Patient Instructions  check your blood sugar once a day.  vary the time of day when you check, between before the 3 meals, and at bedtime.  also check if you have symptoms of your blood sugar being too high or too low.  please keep a record of the readings and bring it to your next appointment here.  please call us sooner if your blood sugar goes below 70, or if it stays over 200.  blood tests are requested for you today.  We'll let you know about the results.  If it is good, we'll decrease or stop the invokana.  Please come back for a follow-up appointment in 3 months.      Addendum: Please reduce the invokana to 100 mg qd

## 2014-09-03 ENCOUNTER — Other Ambulatory Visit (INDEPENDENT_AMBULATORY_CARE_PROVIDER_SITE_OTHER): Payer: Commercial Managed Care - PPO

## 2014-09-03 ENCOUNTER — Encounter: Payer: Self-pay | Admitting: Internal Medicine

## 2014-09-03 ENCOUNTER — Ambulatory Visit (INDEPENDENT_AMBULATORY_CARE_PROVIDER_SITE_OTHER): Payer: Commercial Managed Care - PPO | Admitting: Internal Medicine

## 2014-09-03 VITALS — BP 130/84 | HR 85 | Wt 148.0 lb

## 2014-09-03 DIAGNOSIS — J4520 Mild intermittent asthma, uncomplicated: Secondary | ICD-10-CM

## 2014-09-03 DIAGNOSIS — E538 Deficiency of other specified B group vitamins: Secondary | ICD-10-CM

## 2014-09-03 DIAGNOSIS — M5440 Lumbago with sciatica, unspecified side: Secondary | ICD-10-CM

## 2014-09-03 DIAGNOSIS — E1140 Type 2 diabetes mellitus with diabetic neuropathy, unspecified: Secondary | ICD-10-CM

## 2014-09-03 DIAGNOSIS — R634 Abnormal weight loss: Secondary | ICD-10-CM

## 2014-09-03 LAB — TSH: TSH: 1.06 u[IU]/mL (ref 0.35–4.50)

## 2014-09-03 MED ORDER — ZOLPIDEM TARTRATE 10 MG PO TABS: 5.0000 mg | ORAL_TABLET | Freq: Every evening | ORAL | Status: DC | PRN

## 2014-09-03 MED ORDER — HYDROCHLOROTHIAZIDE 12.5 MG PO CAPS: 12.5000 mg | ORAL_CAPSULE | ORAL | Status: DC

## 2014-09-03 MED ORDER — BUDESONIDE-FORMOTEROL FUMARATE 160-4.5 MCG/ACT IN AERO: 2.0000 | INHALATION_SPRAY | Freq: Two times a day (BID) | RESPIRATORY_TRACT | Status: DC

## 2014-09-03 MED ORDER — RANITIDINE HCL 300 MG PO TABS: 300.0000 mg | ORAL_TABLET | Freq: Every day | ORAL | Status: DC

## 2014-09-03 MED ORDER — PROMETHAZINE HCL 25 MG PO TABS: 25.0000 mg | ORAL_TABLET | Freq: Two times a day (BID) | ORAL | Status: DC | PRN

## 2014-09-03 NOTE — Progress Notes (Signed)
   Subjective:    HPI   The patient presents for a follow-up of  chronic hypertension, chronic dyslipidemia, asthma,  type 2 diabetes controlled with medicines.   She is seeing Dr Loanne Drilling.   Dr Ronnald Ramp did the surgery Oct 2014. Now is back to old job. 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 No LBP now     BP Readings from Last 3 Encounters:  09/03/14 130/84  08/06/14 108/60  05/06/14 126/78   Wt Readings from Last 3 Encounters:  09/03/14 148 lb (67.132 kg)  08/06/14 151 lb (68.493 kg)  05/06/14 158 lb (71.668 kg)     Review of Systems  Constitutional: Negative for chills, activity change, appetite change, fatigue and unexpected weight change.  HENT: Negative for congestion, mouth sores and sinus pressure.   Eyes: Negative for visual disturbance.  Respiratory: Negative for cough and chest tightness.   Gastrointestinal: Negative for nausea.  Genitourinary: Negative for frequency, difficulty urinating and vaginal pain.  Musculoskeletal: Negative for joint swelling and gait problem.  Skin: Negative for pallor and rash.  Neurological: Negative for dizziness and tremors.  Psychiatric/Behavioral: Negative for confusion and sleep disturbance.       Objective:   Physical Exam  Constitutional: She appears well-developed and well-nourished.  HENT:  Head: Normocephalic.  Right Ear: External ear normal.  Left Ear: External ear normal.  Nose: Nose normal.  Mouth/Throat: Oropharynx is clear and moist.  Eyes: Conjunctivae are normal. Pupils are equal, round, and reactive to light. Right eye exhibits no discharge. Left eye exhibits no discharge.  Neck: Normal range of motion. Neck supple. No JVD present. No tracheal deviation present. No thyromegaly present.  Cardiovascular: Normal rate, regular rhythm and normal heart sounds.   Pulmonary/Chest: No stridor. No respiratory distress. She has no wheezes.  Abdominal: Soft. Bowel sounds are normal. She exhibits no  distension and no mass. There is no tenderness. There is no rebound and no guarding.  Musculoskeletal: She exhibits no edema or tenderness.  LS is a little tender str leg elev (-) R  Lymphadenopathy:    She has no cervical adenopathy.  Neurological: She displays normal reflexes. No cranial nerve deficit. She exhibits normal muscle tone. Coordination normal.  Skin: No rash noted. No erythema.  Psychiatric: She has a normal mood and affect. Her behavior is normal. Judgment and thought content normal.    Lab Results  Component Value Date   WBC 8.4 09/16/2011   HGB 12.1 09/16/2011   HCT 37.5 09/16/2011   PLT 311.0 09/16/2011   GLUCOSE 104* 05/01/2014   CHOL 166 12/27/2013   TRIG 56.0 12/27/2013   HDL 39.80 12/27/2013   LDLDIRECT 122.0 03/03/2008   LDLCALC 115* 12/27/2013   ALT 23 09/16/2011   AST 26 09/16/2011   NA 140 05/01/2014   K 4.2 05/01/2014   CL 102 05/01/2014   CREATININE 0.72 05/01/2014   BUN 17 05/01/2014   CO2 32 05/01/2014   TSH 3.01 05/21/2012   HGBA1C 6.5 08/06/2014   MICROALBUR 0.7 05/06/2014    Lab Results  Component Value Date   HGBA1C 6.5 08/06/2014        Assessment & Plan:

## 2014-09-03 NOTE — Assessment & Plan Note (Signed)
Dr Hoover Browns, Janumet, Actos Reduce or d/c Invokana due to wt loss

## 2014-09-03 NOTE — Assessment & Plan Note (Signed)
Better  

## 2014-09-03 NOTE — Assessment & Plan Note (Signed)
On B12 

## 2014-09-03 NOTE — Progress Notes (Signed)
Pre visit review using our clinic review tool, if applicable. No additional management support is needed unless otherwise documented below in the visit note. 

## 2014-11-06 ENCOUNTER — Ambulatory Visit: Payer: Commercial Managed Care - PPO | Admitting: Endocrinology

## 2014-11-13 ENCOUNTER — Other Ambulatory Visit: Payer: Self-pay

## 2014-11-13 ENCOUNTER — Ambulatory Visit (INDEPENDENT_AMBULATORY_CARE_PROVIDER_SITE_OTHER): Payer: Commercial Managed Care - PPO | Admitting: Endocrinology

## 2014-11-13 ENCOUNTER — Encounter: Payer: Self-pay | Admitting: Endocrinology

## 2014-11-13 VITALS — BP 118/78 | HR 94 | Temp 97.6°F | Ht 69.5 in | Wt 151.0 lb

## 2014-11-13 DIAGNOSIS — E1140 Type 2 diabetes mellitus with diabetic neuropathy, unspecified: Secondary | ICD-10-CM | POA: Diagnosis not present

## 2014-11-13 LAB — POCT GLYCOSYLATED HEMOGLOBIN (HGB A1C): Hemoglobin A1C: 5.8

## 2014-11-13 MED ORDER — SITAGLIPTIN PHOS-METFORMIN HCL 50-1000 MG PO TABS: 1.0000 | ORAL_TABLET | Freq: Two times a day (BID) | ORAL | Status: DC

## 2014-11-13 MED ORDER — CANAGLIFLOZIN 100 MG PO TABS: ORAL_TABLET | ORAL | Status: DC

## 2014-11-13 MED ORDER — BROMOCRIPTINE MESYLATE 2.5 MG PO TABS: 2.5000 mg | ORAL_TABLET | Freq: Every day | ORAL | Status: DC

## 2014-11-13 MED ORDER — PIOGLITAZONE HCL 45 MG PO TABS: 45.0000 mg | ORAL_TABLET | Freq: Every day | ORAL | Status: DC

## 2014-11-13 NOTE — Progress Notes (Signed)
Subjective:    Patient ID: Alexandra [name], female    DOB: 10-Oct-1965, 49 y.o.   MRN: [id number]  HPI Pt returns for f/u of diabetes mellitus: DM type: 2 Dx'ed: 9450 Complications: none Therapy: 5 oral meds GDM: never DKA: never Severe hypoglycemia: never Pancreatitis: never Other: she has never been on insulin; she works 3rd shift Interval history: she does not check cbg's.  She denies hypoglycemia.   Past Medical History  Diagnosis Date  . Diabetes mellitus   . Low back pain   . Osteoarthritis   . CTS (carpal tunnel syndrome)   . Glaucoma     ?  . Polyarthralgia   . Asthma   . Hypertension   . Vitamin B12 deficiency   . ALLERGIC RHINITIS     No past surgical history on file.  Social History   Social History  . Marital Status: Single    Spouse Name: N/A  . Number of Children: N/A  . Years of Education: N/A   Occupational History  . 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 Smoking status: Never Smoker   . Smokeless tobacco: Not on file  . Alcohol Use: No  . Drug Use: No  . Sexual Activity: Not Currently   Other Topics Concern  . Not on file   Social History Narrative    Current Outpatient Prescriptions on File Prior to Visit  Medication Sig Dispense Refill  . albuterol (VENTOLIN HFA) 108 (90 BASE) MCG/ACT inhaler Inhale 1 puff into the lungs every 4 (four) hours as needed. 3 Inhaler 3  . budesonide-formoterol (SYMBICORT) 160-4.5 MCG/ACT inhaler Inhale 2 puffs into the lungs 2 (two) times daily. 3 Inhaler 3  . Cholecalciferol 1000 UNITS tablet Take 1 tablet (1,000 Units total) by mouth daily. 100 tablet 3  . cyclobenzaprine (FLEXERIL) 10 MG tablet Take 1 tablet (10 mg total) by mouth 2 (two) times daily as needed. 90 tablet 1  . fluticasone (FLONASE) 50 MCG/ACT nasal spray 2 sprays by Nasal route daily.      . Ginkgo Biloba 60 MG CAPS Take 2 capsules by mouth daily.    Marland Kitchen glucose blood (ONE TOUCH ULTRA TEST) test strip 1 each  by Other route daily. And lancets 1/day 250.00 100 each 12  . hydrochlorothiazide (MICROZIDE) 12.5 MG capsule Take 1 capsule (12.5 mg total) by mouth every morning. 90 capsule 3  . ibuprofen (ADVIL,MOTRIN) 600 MG tablet Take 1 tablet (600 mg total) by mouth every 6 (six) hours as needed. 90 tablet 3  . levonorgestrel-ethinyl estradiol (AVIANE,ALESSE,LESSINA) 0.1-20 MG-MCG tablet Take 1 tablet by mouth as directed. 28 tablet 11  . loratadine (CLARITIN) 10 MG tablet Take 1 tablet (10 mg total) by mouth daily as needed. for allergies 90 tablet 3  . methocarbamol (ROBAXIN) 500 MG tablet as needed.    . norethindrone (MICRONOR,CAMILA,ERRIN) 0.35 MG tablet TAKE ONE TABLET BY MOUTH ONCE DAILY 84 tablet 0  . promethazine (PHENERGAN) 25 MG tablet Take 1 tablet (25 mg total) by mouth 2 (two) times daily as needed for nausea. 60 tablet 1  . ranitidine (ZANTAC) 300 MG tablet Take 1 tablet (300 mg total) by mouth daily. 90 tablet 3  . terbinafine (LAMISIL) 1 % cream Apply topically as needed.      . zolpidem (AMBIEN) 10 MG tablet Take 0.5-1 tablets (5-10 mg total) by mouth at bedtime as needed for sleep. 90 tablet 1   No current  facility-administered medications on file prior to visit.    Allergies  Allergen Reactions  . Benazepril Hcl     REACTION: cough  . Tramadol Hcl     REACTION: itching    Family History  Problem Relation Age of Onset  . Hypertension    . Diabetes Mother   . Kidney disease Mother 26    ESRD on HD  . Heart disease Mother   . Cancer Father     head and neck  . Stroke Brother 45    CVAx2 on crack    BP 118/78 mmHg  Pulse 94  Temp(Src) 97.6 F (36.4 C) (Oral)  Ht 5' 9.5" (1.765 m)  Wt 151 lb (68.493 kg)  BMI 21.99 kg/m2  SpO2 98%  Review of Systems She denies hypoglycemia    Objective:   Physical Exam VITAL SIGNS:  See vs page GENERAL: no distress Pulses: dorsalis pedis intact bilat.   MSK: no deformity of the feet CV: no leg edema Skin:  no ulcer on the  feet.  normal color and temp on the feet. Neuro: sensation is intact to touch on the feet   A1c=5.8%    Assessment & Plan:  DM: well-controlled  Patient is advised the following: Patient Instructions  Your a1c today is 5.8%--good.  Please continue the same medications.  check your blood sugar once a day.  vary the time of day when you check, between before the 3 meals, and at bedtime.  also check if you have symptoms of your blood sugar being too high or too low.  please keep a record of the readings and bring it to your next appointment here.  please call us sooner if your blood sugar goes below 70, or if it stays over 200.  Please come back for a follow-up appointment in 6 months.

## 2014-11-13 NOTE — Patient Instructions (Addendum)
Your a1c today is 5.8%--good.  Please continue the same medications.  check your blood sugar once a day.  vary the time of day when you check, between before the 3 meals, and at bedtime.  also check if you have symptoms of your blood sugar being too high or too low.  please keep a record of the readings and bring it to your next appointment here.  please call us sooner if your blood sugar goes below 70, or if it stays over 200.  Please come back for a follow-up appointment in 6 months.

## 2014-11-27 ENCOUNTER — Other Ambulatory Visit: Payer: Self-pay | Admitting: Internal Medicine

## 2015-01-05 ENCOUNTER — Ambulatory Visit: Payer: Commercial Managed Care - PPO | Admitting: Internal Medicine

## 2015-01-12 ENCOUNTER — Encounter: Payer: Self-pay | Admitting: Internal Medicine

## 2015-01-12 ENCOUNTER — Ambulatory Visit (INDEPENDENT_AMBULATORY_CARE_PROVIDER_SITE_OTHER): Payer: Commercial Managed Care - PPO | Admitting: Internal Medicine

## 2015-01-12 VITALS — BP 104/80 | HR 84 | Wt 148.0 lb

## 2015-01-12 DIAGNOSIS — I10 Essential (primary) hypertension: Secondary | ICD-10-CM | POA: Diagnosis not present

## 2015-01-12 DIAGNOSIS — E876 Hypokalemia: Secondary | ICD-10-CM | POA: Diagnosis not present

## 2015-01-12 DIAGNOSIS — M5440 Lumbago with sciatica, unspecified side: Secondary | ICD-10-CM | POA: Diagnosis not present

## 2015-01-12 DIAGNOSIS — E538 Deficiency of other specified B group vitamins: Secondary | ICD-10-CM

## 2015-01-12 MED ORDER — PROMETHAZINE HCL 25 MG PO TABS: 25.0000 mg | ORAL_TABLET | Freq: Two times a day (BID) | ORAL | Status: DC | PRN

## 2015-01-12 MED ORDER — LORATADINE 10 MG PO TABS: 10.0000 mg | ORAL_TABLET | Freq: Every day | ORAL | Status: DC | PRN

## 2015-01-12 NOTE — Progress Notes (Signed)
Pre visit review using our clinic review tool, if applicable. No additional management support is needed unless otherwise documented below in the visit note. 

## 2015-01-12 NOTE — Assessment & Plan Note (Signed)
Invokana, Janumet, Actos

## 2015-01-12 NOTE — Assessment & Plan Note (Signed)
Doing better.   

## 2015-01-12 NOTE — Assessment & Plan Note (Signed)
On B12 

## 2015-01-12 NOTE — Progress Notes (Signed)
Subjective:  Patient ID: Alexandra [name], female    DOB: 1965-09-27  Age: 49 y.o. MRN: [id number]  CC: No chief complaint on file.   HPI Alexandra [name] presents for DM, HTN, GERD f/u. C/o wt loss  Outpatient Prescriptions Prior to Visit  Medication Sig Dispense Refill  . albuterol (VENTOLIN HFA) 108 (90 BASE) MCG/ACT inhaler Inhale 1 puff into the lungs every 4 (four) hours as needed. 3 Inhaler 3  . bromocriptine (PARLODEL) 2.5 MG tablet Take 1 tablet (2.5 mg total) by mouth daily. 90 tablet 1  . budesonide-formoterol (SYMBICORT) 160-4.5 MCG/ACT inhaler Inhale 2 puffs into the lungs 2 (two) times daily. 3 Inhaler 3  . canagliflozin (INVOKANA) 100 MG TABS tablet Take 0.5 tab daily. 45 tablet 1  . Cholecalciferol 1000 UNITS tablet Take 1 tablet (1,000 Units total) by mouth daily. 100 tablet 3  . cyclobenzaprine (FLEXERIL) 10 MG tablet Take 1 tablet (10 mg total) by mouth 2 (two) times daily as needed. 90 tablet 1  . fluticasone (FLONASE) 50 MCG/ACT nasal spray 2 sprays by Nasal route daily.      . Ginkgo Biloba 60 MG CAPS Take 2 capsules by mouth daily.    Marland Kitchen glucose blood (ONE TOUCH ULTRA TEST) test strip 1 each by Other route daily. And lancets 1/day 250.00 100 each 12  . hydrochlorothiazide (MICROZIDE) 12.5 MG capsule Take 1 capsule (12.5 mg total) by mouth every morning. 90 capsule 3  . ibuprofen (ADVIL,MOTRIN) 600 MG tablet TAKE ONE TABLET BY MOUTH EVERY 6 HOURS AS NEEDED 90 tablet 0  . levonorgestrel-ethinyl estradiol (AVIANE,ALESSE,LESSINA) 0.1-20 MG-MCG tablet Take 1 tablet by mouth as directed. 28 tablet 11  . loratadine (CLARITIN) 10 MG tablet Take 1 tablet (10 mg total) by mouth daily as needed. for allergies 90 tablet 3  . norethindrone (MICRONOR,CAMILA,ERRIN) 0.35 MG tablet TAKE ONE TABLET BY MOUTH ONCE DAILY 84 tablet 0  . pioglitazone (ACTOS) 45 MG tablet Take 1 tablet (45 mg total) by mouth daily. 90 tablet 1  . promethazine (PHENERGAN) 25 MG tablet Take 1 tablet (25 mg total)  by mouth 2 (two) times daily as needed for nausea. 60 tablet 1  . ranitidine (ZANTAC) 300 MG tablet Take 1 tablet (300 mg total) by mouth daily. 90 tablet 3  . sitaGLIPtin-metformin (JANUMET) 50-1000 MG per tablet Take 1 tablet by mouth 2 (two) times daily with a meal. 180 tablet 1  . terbinafine (LAMISIL) 1 % cream Apply topically as needed.      . methocarbamol (ROBAXIN) 500 MG tablet as needed.    . zolpidem (AMBIEN) 10 MG tablet Take 0.5-1 tablets (5-10 mg total) by mouth at bedtime as needed for sleep. 90 tablet 1   No facility-administered medications prior to visit.    ROS Review of Systems  Constitutional: Negative for chills, activity change, appetite change, fatigue and unexpected weight change.  HENT: Negative for congestion, ear discharge, mouth sores and sinus pressure.   Eyes: Negative for visual disturbance.  Respiratory: Negative for cough and chest tightness.   Gastrointestinal: Negative for nausea and abdominal pain.  Genitourinary: Negative for frequency, difficulty urinating and vaginal pain.  Musculoskeletal: Negative for back pain and gait problem.  Skin: Negative for pallor and rash.  Neurological: Negative for dizziness, tremors, weakness, numbness and headaches.  Psychiatric/Behavioral: Negative for suicidal ideas, confusion and sleep disturbance.    Objective:  BP 104/80 mmHg  Pulse 84  Wt 148 lb (67.132 kg)  SpO2 98%  BP Readings  from Last 3 Encounters:  01/12/15 104/80  11/13/14 118/78  09/03/14 130/84    Wt Readings from Last 3 Encounters:  01/12/15 148 lb (67.132 kg)  11/13/14 151 lb (68.493 kg)  09/03/14 148 lb (67.132 kg)    Physical Exam  Constitutional: She appears well-developed. No distress.  HENT:  Head: Normocephalic.  Right Ear: External ear normal.  Left Ear: External ear normal.  Nose: Nose normal.  Mouth/Throat: Oropharynx is clear and moist.  Eyes: Conjunctivae are normal. Pupils are equal, round, and reactive to light.  Right eye exhibits no discharge. Left eye exhibits no discharge.  Neck: Normal range of motion. Neck supple. No JVD present. No tracheal deviation present. No thyromegaly present.  Cardiovascular: Normal rate, regular rhythm and normal heart sounds.   Pulmonary/Chest: No stridor. No respiratory distress. She has no wheezes.  Abdominal: Soft. Bowel sounds are normal. She exhibits no distension and no mass. There is no tenderness. There is no rebound and no guarding.  Musculoskeletal: She exhibits no edema or tenderness.  Lymphadenopathy:    She has no cervical adenopathy.  Neurological: She displays normal reflexes. No cranial nerve deficit. She exhibits normal muscle tone. Coordination normal.  Skin: No rash noted. No erythema.  Psychiatric: She has a normal mood and affect. Her behavior is normal. Judgment and thought content normal.    Lab Results  Component Value Date   WBC 8.4 09/16/2011   HGB 12.1 09/16/2011   HCT 37.5 09/16/2011   PLT 311.0 09/16/2011   GLUCOSE 104* 05/01/2014   CHOL 166 12/27/2013   TRIG 56.0 12/27/2013   HDL 39.80 12/27/2013   LDLDIRECT 122.0 03/03/2008   LDLCALC 115* 12/27/2013   ALT 23 09/16/2011   AST 26 09/16/2011   NA 140 05/01/2014   K 4.2 05/01/2014   CL 102 05/01/2014   CREATININE 0.72 05/01/2014   BUN 17 05/01/2014   CO2 32 05/01/2014   TSH 1.06 09/03/2014   HGBA1C 5.8 11/13/2014   MICROALBUR 0.7 05/06/2014    Mm Digital Screening Bilateral  11/19/2013  CLINICAL DATA:  Screening. EXAM: DIGITAL SCREENING BILATERAL MAMMOGRAM WITH CAD COMPARISON:  Previous exam(s). ACR Breast Density Category c: The breast tissue is heterogeneously dense, which may obscure small masses. FINDINGS: There are no findings suspicious for malignancy. Images were processed with CAD. IMPRESSION: No mammographic evidence of malignancy. A result letter of this screening mammogram will be mailed directly to the patient. RECOMMENDATION: Screening mammogram in one year.  (Code:SM-B-01Y) BI-RADS CATEGORY  1: Negative. Electronically Signed   By: Lillia Mountain M.D.   On: 11/19/2013 13:31    Assessment & Plan:   Diagnoses and all orders for this visit:  Vitamin B12 deficiency  Essential hypertension  Midline low back pain with sciatica, sciatica laterality unspecified, unspecified chronicity  HYPOKALEMIA  Other orders -     loratadine (CLARITIN) 10 MG tablet; Take 1 tablet (10 mg total) by mouth daily as needed. for allergies -     promethazine (PHENERGAN) 25 MG tablet; Take 1 tablet (25 mg total) by mouth 2 (two) times daily as needed for nausea.   I have discontinued Ms. Arista's methocarbamol. I am also having her maintain her fluticasone, terbinafine, Cholecalciferol, albuterol, levonorgestrel-ethinyl estradiol, glucose blood, norethindrone, cyclobenzaprine, loratadine, Ginkgo Biloba, hydrochlorothiazide, budesonide-formoterol, zolpidem, promethazine, ranitidine, sitaGLIPtin-metformin, pioglitazone, canagliflozin, bromocriptine, and ibuprofen.  No orders of the defined types were placed in this encounter.     Follow-up: No Follow-up on file.  Walker Kehr, MD

## 2015-01-12 NOTE — Assessment & Plan Note (Signed)
BMET 

## 2015-01-12 NOTE — Assessment & Plan Note (Signed)
On HCTZ 

## 2015-04-05 ENCOUNTER — Other Ambulatory Visit: Payer: Self-pay | Admitting: Internal Medicine

## 2015-05-18 ENCOUNTER — Ambulatory Visit (INDEPENDENT_AMBULATORY_CARE_PROVIDER_SITE_OTHER): Payer: Commercial Managed Care - PPO | Admitting: Endocrinology

## 2015-05-18 ENCOUNTER — Other Ambulatory Visit (INDEPENDENT_AMBULATORY_CARE_PROVIDER_SITE_OTHER): Payer: Commercial Managed Care - PPO

## 2015-05-18 ENCOUNTER — Ambulatory Visit (INDEPENDENT_AMBULATORY_CARE_PROVIDER_SITE_OTHER): Payer: Commercial Managed Care - PPO | Admitting: Internal Medicine

## 2015-05-18 ENCOUNTER — Encounter: Payer: Self-pay | Admitting: Internal Medicine

## 2015-05-18 VITALS — BP 126/84 | HR 81 | Temp 98.0°F | Ht 69.5 in | Wt 150.0 lb

## 2015-05-18 VITALS — BP 120/80 | HR 86 | Wt 149.0 lb

## 2015-05-18 DIAGNOSIS — E1140 Type 2 diabetes mellitus with diabetic neuropathy, unspecified: Secondary | ICD-10-CM

## 2015-05-18 DIAGNOSIS — E538 Deficiency of other specified B group vitamins: Secondary | ICD-10-CM

## 2015-05-18 DIAGNOSIS — J4520 Mild intermittent asthma, uncomplicated: Secondary | ICD-10-CM

## 2015-05-18 DIAGNOSIS — Z23 Encounter for immunization: Secondary | ICD-10-CM

## 2015-05-18 DIAGNOSIS — I10 Essential (primary) hypertension: Secondary | ICD-10-CM

## 2015-05-18 DIAGNOSIS — M5440 Lumbago with sciatica, unspecified side: Secondary | ICD-10-CM

## 2015-05-18 LAB — MICROALBUMIN / CREATININE URINE RATIO
Creatinine,U: 52.2 mg/dL
Microalb Creat Ratio: 1.3 mg/g (ref 0.0–30.0)

## 2015-05-18 LAB — URINALYSIS
Bilirubin Urine: NEGATIVE
Hgb urine dipstick: NEGATIVE
Ketones, ur: NEGATIVE
Leukocytes, UA: NEGATIVE
Nitrite: NEGATIVE
SPECIFIC GRAVITY, URINE: 1.015 (ref 1.000–1.030)
TOTAL PROTEIN, URINE-UPE24: NEGATIVE
URINE GLUCOSE: 500 — AB
Urobilinogen, UA: 0.2 (ref 0.0–1.0)
pH: 7 (ref 5.0–8.0)

## 2015-05-18 LAB — HEPATIC FUNCTION PANEL
ALT: 17 U/L (ref 0–35)
AST: 18 U/L (ref 0–37)
Albumin: 4.6 g/dL (ref 3.5–5.2)
Alkaline Phosphatase: 62 U/L (ref 39–117)
BILIRUBIN DIRECT: 0.1 mg/dL (ref 0.0–0.3)
BILIRUBIN TOTAL: 0.4 mg/dL (ref 0.2–1.2)
Total Protein: 7.7 g/dL (ref 6.0–8.3)

## 2015-05-18 LAB — TSH: TSH: 0.63 u[IU]/mL (ref 0.35–4.50)

## 2015-05-18 LAB — VITAMIN B12: VITAMIN B 12: 1137 pg/mL — AB (ref 211–911)

## 2015-05-18 LAB — BASIC METABOLIC PANEL
BUN: 15 mg/dL (ref 6–23)
CHLORIDE: 102 meq/L (ref 96–112)
CO2: 30 mEq/L (ref 19–32)
Calcium: 9.7 mg/dL (ref 8.4–10.5)
Creatinine, Ser: 0.59 mg/dL (ref 0.40–1.20)
GFR: 139.25 mL/min (ref >60.00)
Glucose, Bld: 86 mg/dL (ref 70–99)
Potassium: 4.3 mEq/L (ref 3.5–5.1)
Sodium: 141 mEq/L (ref 135–145)

## 2015-05-18 LAB — POCT GLYCOSYLATED HEMOGLOBIN (HGB A1C): Hemoglobin A1C: 5.5

## 2015-05-18 MED ORDER — IBUPROFEN 600 MG PO TABS: 600.0000 mg | ORAL_TABLET | Freq: Four times a day (QID) | ORAL | Status: DC | PRN

## 2015-05-18 MED ORDER — BUDESONIDE-FORMOTEROL FUMARATE 160-4.5 MCG/ACT IN AERO: 2.0000 | INHALATION_SPRAY | Freq: Two times a day (BID) | RESPIRATORY_TRACT | Status: DC

## 2015-05-18 MED ORDER — RANITIDINE HCL 300 MG PO TABS: 300.0000 mg | ORAL_TABLET | Freq: Every day | ORAL | Status: DC

## 2015-05-18 NOTE — Progress Notes (Signed)
Pre visit review using our clinic review tool, if applicable. No additional management support is needed unless otherwise documented below in the visit note. 

## 2015-05-18 NOTE — Patient Instructions (Signed)
Please continue the same medications Please come back for a follow-up appointment in 6 months Please learn how to take insulin, incase of emergency.

## 2015-05-18 NOTE — Progress Notes (Signed)
Subjective:    Patient ID: Alexandra [name], female    DOB: 1966/03/01, 50 y.o.   MRN: [id number]  HPI Pt returns for f/u of diabetes mellitus: DM type: 2 Dx'ed: 123456 Complications: none Therapy: 5 oral meds.  GDM: never DKA: never Severe hypoglycemia: never Pancreatitis: never Other: she has never been on insulin; she is presumed to be evolving type 1, based on lean body habitus, and mother's h/o type 1. Interval history: she does not check cbg's.  pt states she feels well in general.  Past Medical History  Diagnosis Date  . Diabetes mellitus   . Low back pain   . Osteoarthritis   . CTS (carpal tunnel syndrome)   . Glaucoma     ?  . Polyarthralgia   . Asthma   . Hypertension   . Vitamin B12 deficiency   . ALLERGIC RHINITIS     No past surgical history on file.  Social History   Social History  . Marital Status: Single    Spouse Name: N/A  . Number of Children: N/A  . Years of Education: N/A   Occupational History  . 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 Smoking status: Never Smoker   . Smokeless tobacco: Not on file  . Alcohol Use: No  . Drug Use: No  . Sexual Activity: Not Currently   Other Topics Concern  . Not on file   Social History Narrative    Current Outpatient Prescriptions on File Prior to Visit  Medication Sig Dispense Refill  . albuterol (VENTOLIN HFA) 108 (90 BASE) MCG/ACT inhaler Inhale 1 puff into the lungs every 4 (four) hours as needed. 3 Inhaler 3  . bromocriptine (PARLODEL) 2.5 MG tablet Take 1 tablet (2.5 mg total) by mouth daily. 90 tablet 1  . canagliflozin (INVOKANA) 100 MG TABS tablet Take 0.5 tab daily. 45 tablet 1  . Cholecalciferol 1000 UNITS tablet Take 1 tablet (1,000 Units total) by mouth daily. 100 tablet 3  . cyclobenzaprine (FLEXERIL) 10 MG tablet Take 1 tablet (10 mg total) by mouth 2 (two) times daily as needed. 90 tablet 1  . fluticasone (FLONASE) 50 MCG/ACT nasal spray 2 sprays by  Nasal route daily.      . Ginkgo Biloba 60 MG CAPS Take 2 capsules by mouth daily.    Marland Kitchen glucose blood (ONE TOUCH ULTRA TEST) test strip 1 each by Other route daily. And lancets 1/day 250.00 100 each 12  . hydrochlorothiazide (MICROZIDE) 12.5 MG capsule Take 1 capsule (12.5 mg total) by mouth every morning. 90 capsule 3  . levonorgestrel-ethinyl estradiol (AVIANE,ALESSE,LESSINA) 0.1-20 MG-MCG tablet Take 1 tablet by mouth as directed. 28 tablet 11  . loratadine (CLARITIN) 10 MG tablet Take 1 tablet (10 mg total) by mouth daily as needed for allergies. for allergies 100 tablet 3  . loratadine (CLARITIN) 10 MG tablet TAKE ONE TABLET BY MOUTH ONCE DAILY AS NEEDED FOR ALLERGIES 90 tablet 0  . norethindrone (MICRONOR,CAMILA,ERRIN) 0.35 MG tablet TAKE ONE TABLET BY MOUTH ONCE DAILY 84 tablet 0  . pioglitazone (ACTOS) 45 MG tablet Take 1 tablet (45 mg total) by mouth daily. 90 tablet 1  . promethazine (PHENERGAN) 25 MG tablet Take 1 tablet (25 mg total) by mouth 2 (two) times daily as needed for nausea. 60 tablet 2  . sitaGLIPtin-metformin (JANUMET) 50-1000 MG per tablet Take 1 tablet by mouth 2 (two) times daily with a meal. 180 tablet 1  .  terbinafine (LAMISIL) 1 % cream Apply topically as needed.      . zolpidem (AMBIEN) 10 MG tablet Take 0.5-1 tablets (5-10 mg total) by mouth at bedtime as needed for sleep. 90 tablet 1   No current facility-administered medications on file prior to visit.    Allergies  Allergen Reactions  . Benazepril Hcl     REACTION: cough  . Tramadol Hcl     REACTION: itching    Family History  Problem Relation Age of Onset  . Hypertension    . Diabetes Mother   . Kidney disease Mother 21    ESRD on HD  . Heart disease Mother   . Cancer Father     head and neck  . Stroke Brother 45    CVAx2 on crack    BP 126/84 mmHg  Pulse 81  Temp(Src) 98 F (36.7 C) (Oral)  Ht 5' 9.5" (1.765 m)  Wt 150 lb (68.04 kg)  BMI 21.84 kg/m2  SpO2 98%    Review of  Systems No weight change.      Objective:   Physical Exam VITAL SIGNS:  See vs page GENERAL: no distress Pulses: dorsalis pedis intact bilat.   MSK: no deformity of the feet CV: no leg edema Skin:  no ulcer on the feet.  normal color and temp on the feet. Neuro: sensation is intact to touch on the feet  A1c=5.5%     Assessment & Plan:  DM: well-controlled, but she will need insulin at some point.    Patient is advised the following: Patient Instructions  Please continue the same medications Please come back for a follow-up appointment in 6 months Please learn how to take insulin, incase of emergency.

## 2015-05-18 NOTE — Progress Notes (Signed)
Subjective:  Patient ID: Alexandra [name], female    DOB: 1965-04-17  Age: 50 y.o. MRN: [id number]  CC: No chief complaint on file.   HPI Alexandra [name] presents for DM, HTN, insomnia f/u A1c 5.5%  Outpatient Prescriptions Prior to Visit  Medication Sig Dispense Refill  . albuterol (VENTOLIN HFA) 108 (90 BASE) MCG/ACT inhaler Inhale 1 puff into the lungs every 4 (four) hours as needed. 3 Inhaler 3  . bromocriptine (PARLODEL) 2.5 MG tablet Take 1 tablet (2.5 mg total) by mouth daily. 90 tablet 1  . canagliflozin (INVOKANA) 100 MG TABS tablet Take 0.5 tab daily. 45 tablet 1  . Cholecalciferol 1000 UNITS tablet Take 1 tablet (1,000 Units total) by mouth daily. 100 tablet 3  . cyclobenzaprine (FLEXERIL) 10 MG tablet Take 1 tablet (10 mg total) by mouth 2 (two) times daily as needed. 90 tablet 1  . fluticasone (FLONASE) 50 MCG/ACT nasal spray 2 sprays by Nasal route daily.      . Ginkgo Biloba 60 MG CAPS Take 2 capsules by mouth daily.    Marland Kitchen glucose blood (ONE TOUCH ULTRA TEST) test strip 1 each by Other route daily. And lancets 1/day 250.00 100 each 12  . hydrochlorothiazide (MICROZIDE) 12.5 MG capsule Take 1 capsule (12.5 mg total) by mouth every morning. 90 capsule 3  . levonorgestrel-ethinyl estradiol (AVIANE,ALESSE,LESSINA) 0.1-20 MG-MCG tablet Take 1 tablet by mouth as directed. 28 tablet 11  . loratadine (CLARITIN) 10 MG tablet Take 1 tablet (10 mg total) by mouth daily as needed for allergies. for allergies 100 tablet 3  . loratadine (CLARITIN) 10 MG tablet TAKE ONE TABLET BY MOUTH ONCE DAILY AS NEEDED FOR ALLERGIES 90 tablet 0  . norethindrone (MICRONOR,CAMILA,ERRIN) 0.35 MG tablet TAKE ONE TABLET BY MOUTH ONCE DAILY 84 tablet 0  . pioglitazone (ACTOS) 45 MG tablet Take 1 tablet (45 mg total) by mouth daily. 90 tablet 1  . promethazine (PHENERGAN) 25 MG tablet Take 1 tablet (25 mg total) by mouth 2 (two) times daily as needed for nausea. 60 tablet 2  . sitaGLIPtin-metformin (JANUMET)  50-1000 MG per tablet Take 1 tablet by mouth 2 (two) times daily with a meal. 180 tablet 1  . terbinafine (LAMISIL) 1 % cream Apply topically as needed.      . budesonide-formoterol (SYMBICORT) 160-4.5 MCG/ACT inhaler Inhale 2 puffs into the lungs 2 (two) times daily. 3 Inhaler 3  . ibuprofen (ADVIL,MOTRIN) 600 MG tablet TAKE ONE TABLET BY MOUTH EVERY 6 HOURS AS NEEDED 90 tablet 0  . ranitidine (ZANTAC) 300 MG tablet Take 1 tablet (300 mg total) by mouth daily. 90 tablet 3  . zolpidem (AMBIEN) 10 MG tablet Take 0.5-1 tablets (5-10 mg total) by mouth at bedtime as needed for sleep. 90 tablet 1   No facility-administered medications prior to visit.    ROS Review of Systems  Constitutional: Negative for chills, activity change, appetite change, fatigue and unexpected weight change.  HENT: Negative for congestion, mouth sores and sinus pressure.   Eyes: Negative for visual disturbance.  Respiratory: Negative for cough and chest tightness.   Gastrointestinal: Negative for nausea and abdominal pain.  Genitourinary: Negative for frequency, difficulty urinating and vaginal pain.  Musculoskeletal: Positive for back pain and arthralgias. Negative for gait problem.  Skin: Negative for pallor and rash.  Neurological: Negative for dizziness, tremors, weakness, numbness and headaches.  Psychiatric/Behavioral: Negative for confusion and sleep disturbance.    Objective:  BP 120/80 mmHg  Pulse 86  Wt  149 lb (67.586 kg)  SpO2 98%  BP Readings from Last 3 Encounters:  05/18/15 120/80  05/18/15 126/84  01/12/15 104/80    Wt Readings from Last 3 Encounters:  05/18/15 149 lb (67.586 kg)  05/18/15 150 lb (68.04 kg)  01/12/15 148 lb (67.132 kg)    Physical Exam  Constitutional: She appears well-developed. No distress.  HENT:  Head: Normocephalic.  Right Ear: External ear normal.  Left Ear: External ear normal.  Nose: Nose normal.  Mouth/Throat: Oropharynx is clear and moist.  Eyes:  Conjunctivae are normal. Pupils are equal, round, and reactive to light. Right eye exhibits no discharge. Left eye exhibits no discharge.  Neck: Normal range of motion. Neck supple. No JVD present. No tracheal deviation present. No thyromegaly present.  Cardiovascular: Normal rate, regular rhythm and normal heart sounds.   Pulmonary/Chest: No stridor. No respiratory distress. She has no wheezes.  Abdominal: Soft. Bowel sounds are normal. She exhibits no distension and no mass. There is no tenderness. There is no rebound and no guarding.  Musculoskeletal: She exhibits no edema or tenderness.  Lymphadenopathy:    She has no cervical adenopathy.  Neurological: She displays normal reflexes. No cranial nerve deficit. She exhibits normal muscle tone. Coordination normal.  Skin: No rash noted. No erythema.  Psychiatric: She has a normal mood and affect. Her behavior is normal. Judgment and thought content normal.    Lab Results  Component Value Date   WBC 8.4 09/16/2011   HGB 12.1 09/16/2011   HCT 37.5 09/16/2011   PLT 311.0 09/16/2011   GLUCOSE 86 05/18/2015   CHOL 166 12/27/2013   TRIG 56.0 12/27/2013   HDL 39.80 12/27/2013   LDLDIRECT 122.0 03/03/2008   LDLCALC 115* 12/27/2013   ALT 17 05/18/2015   AST 18 05/18/2015   NA 141 05/18/2015   K 4.3 05/18/2015   CL 102 05/18/2015   CREATININE 0.59 05/18/2015   BUN 15 05/18/2015   CO2 30 05/18/2015   TSH 0.63 05/18/2015   HGBA1C 5.5 05/18/2015   MICROALBUR <0.7 05/18/2015    Mm Digital Screening Bilateral  11/19/2013  CLINICAL DATA:  Screening. EXAM: DIGITAL SCREENING BILATERAL MAMMOGRAM WITH CAD COMPARISON:  Previous exam(s). ACR Breast Density Category c: The breast tissue is heterogeneously dense, which may obscure small masses. FINDINGS: There are no findings suspicious for malignancy. Images were processed with CAD. IMPRESSION: No mammographic evidence of malignancy. A result letter of this screening mammogram will be mailed directly  to the patient. RECOMMENDATION: Screening mammogram in one year. (Code:SM-B-01Y) BI-RADS CATEGORY  1: Negative. Electronically Signed   By: Lillia Mountain M.D.   On: 11/19/2013 13:31    Assessment & Plan:   Diagnoses and all orders for this visit:  Essential hypertension -     TSH; Future  Asthma, mild intermittent, uncomplicated  Type 2 diabetes mellitus with diabetic neuropathy, unspecified long term insulin use status (HCC) -     TSH; Future -     Basic metabolic panel; Future -     Hepatic function panel; Future -     Urinalysis; Future -     Microalbumin / creatinine urine ratio; Future  Vitamin B12 deficiency -     TSH; Future -     Vitamin B12; Future  Need for influenza vaccination -     Flu Vaccine QUAD 36+ mos IM  Other orders -     ranitidine (ZANTAC) 300 MG tablet; Take 1 tablet (300 mg total) by mouth daily. -  budesonide-formoterol (SYMBICORT) 160-4.5 MCG/ACT inhaler; Inhale 2 puffs into the lungs 2 (two) times daily. -     ibuprofen (ADVIL,MOTRIN) 600 MG tablet; Take 1 tablet (600 mg total) by mouth every 6 (six) hours as needed.   I have changed Ms. Rosten's ibuprofen. I am also having her maintain her fluticasone, terbinafine, Cholecalciferol, albuterol, levonorgestrel-ethinyl estradiol, glucose blood, norethindrone, cyclobenzaprine, Ginkgo Biloba, hydrochlorothiazide, zolpidem, sitaGLIPtin-metformin, pioglitazone, canagliflozin, bromocriptine, loratadine, promethazine, loratadine, ranitidine, and budesonide-formoterol.  Meds ordered this encounter  Medications  . ranitidine (ZANTAC) 300 MG tablet    Sig: Take 1 tablet (300 mg total) by mouth daily.    Dispense:  90 tablet    Refill:  3  . budesonide-formoterol (SYMBICORT) 160-4.5 MCG/ACT inhaler    Sig: Inhale 2 puffs into the lungs 2 (two) times daily.    Dispense:  3 Inhaler    Refill:  3  . ibuprofen (ADVIL,MOTRIN) 600 MG tablet    Sig: Take 1 tablet (600 mg total) by mouth every 6 (six) hours as  needed.    Dispense:  90 tablet    Refill:  3     Follow-up: Return in about 4 months (around 09/15/2015) for a follow-up visit.  Walker Kehr, MD

## 2015-05-18 NOTE — Assessment & Plan Note (Signed)
On Symbicort

## 2015-05-18 NOTE — Assessment & Plan Note (Signed)
Doing fair 

## 2015-05-18 NOTE — Assessment & Plan Note (Signed)
Labs On B12 

## 2015-05-18 NOTE — Assessment & Plan Note (Signed)
On HCTZ Labs 

## 2015-05-18 NOTE — Assessment & Plan Note (Addendum)
Invokana, Janumet, Actos, Parlodel Dr Loanne Drilling - had an OV today

## 2015-07-16 ENCOUNTER — Other Ambulatory Visit: Payer: Self-pay | Admitting: Internal Medicine

## 2015-08-30 ENCOUNTER — Other Ambulatory Visit: Payer: Self-pay | Admitting: Endocrinology

## 2015-09-15 ENCOUNTER — Ambulatory Visit: Payer: Commercial Managed Care - PPO | Admitting: Internal Medicine

## 2015-09-16 ENCOUNTER — Encounter: Payer: Self-pay | Admitting: Internal Medicine

## 2015-09-16 ENCOUNTER — Other Ambulatory Visit (INDEPENDENT_AMBULATORY_CARE_PROVIDER_SITE_OTHER): Payer: Commercial Managed Care - PPO

## 2015-09-16 ENCOUNTER — Ambulatory Visit (INDEPENDENT_AMBULATORY_CARE_PROVIDER_SITE_OTHER): Payer: Commercial Managed Care - PPO | Admitting: Internal Medicine

## 2015-09-16 VITALS — BP 120/76 | HR 86 | Wt 148.0 lb

## 2015-09-16 DIAGNOSIS — R252 Cramp and spasm: Secondary | ICD-10-CM

## 2015-09-16 DIAGNOSIS — I10 Essential (primary) hypertension: Secondary | ICD-10-CM

## 2015-09-16 DIAGNOSIS — E1140 Type 2 diabetes mellitus with diabetic neuropathy, unspecified: Secondary | ICD-10-CM

## 2015-09-16 DIAGNOSIS — E538 Deficiency of other specified B group vitamins: Secondary | ICD-10-CM

## 2015-09-16 LAB — BASIC METABOLIC PANEL
BUN: 14 mg/dL (ref 6–23)
CALCIUM: 9.8 mg/dL (ref 8.4–10.5)
CO2: 31 meq/L (ref 19–32)
CREATININE: 0.69 mg/dL (ref 0.40–1.20)
Chloride: 104 mEq/L (ref 96–112)
GFR: 116.07 mL/min (ref >60.00)
GLUCOSE: 102 mg/dL — AB (ref 70–99)
Potassium: 4.7 mEq/L (ref 3.5–5.1)
Sodium: 141 mEq/L (ref 135–145)

## 2015-09-16 LAB — HEMOGLOBIN A1C: Hgb A1c MFr Bld: 6.4 % (ref 4.6–6.5)

## 2015-09-16 LAB — MAGNESIUM: Magnesium: 1.9 mg/dL (ref 1.5–2.5)

## 2015-09-16 MED ORDER — HYDROCHLOROTHIAZIDE 12.5 MG PO CAPS: 12.5000 mg | ORAL_CAPSULE | ORAL | Status: DC

## 2015-09-16 MED ORDER — LORATADINE 10 MG PO TABS: 10.0000 mg | ORAL_TABLET | Freq: Every day | ORAL | Status: DC | PRN

## 2015-09-16 MED ORDER — BUDESONIDE-FORMOTEROL FUMARATE 160-4.5 MCG/ACT IN AERO: 2.0000 | INHALATION_SPRAY | Freq: Two times a day (BID) | RESPIRATORY_TRACT | Status: DC

## 2015-09-16 MED ORDER — IBUPROFEN 600 MG PO TABS: 600.0000 mg | ORAL_TABLET | Freq: Four times a day (QID) | ORAL | Status: DC | PRN

## 2015-09-16 MED ORDER — RANITIDINE HCL 300 MG PO TABS: 300.0000 mg | ORAL_TABLET | Freq: Every day | ORAL | Status: DC

## 2015-09-16 NOTE — Patient Instructions (Addendum)
You can try Magnesium, Tylenol PM for cramps

## 2015-09-16 NOTE — Assessment & Plan Note (Signed)
You can try Magnesium, Tylenol PM

## 2015-09-16 NOTE — Progress Notes (Signed)
Subjective:  Patient ID: Alexandra [name], female    DOB: 1966-03-09  Age: 50 y.o. MRN: [id number]  CC: No chief complaint on file.   HPI MAYDELL GRINSTEAD presents for DM, HTN, allergies f/u  Outpatient Prescriptions Prior to Visit  Medication Sig Dispense Refill  . albuterol (VENTOLIN HFA) 108 (90 BASE) MCG/ACT inhaler Inhale 1 puff into the lungs every 4 (four) hours as needed. 3 Inhaler 3  . bromocriptine (PARLODEL) 2.5 MG tablet Take 1 tablet (2.5 mg total) by mouth daily. 90 tablet 1  . budesonide-formoterol (SYMBICORT) 160-4.5 MCG/ACT inhaler Inhale 2 puffs into the lungs 2 (two) times daily. 3 Inhaler 3  . canagliflozin (INVOKANA) 100 MG TABS tablet Take 0.5 tab daily. 45 tablet 1  . Cholecalciferol 1000 UNITS tablet Take 1 tablet (1,000 Units total) by mouth daily. 100 tablet 3  . cyclobenzaprine (FLEXERIL) 10 MG tablet Take 1 tablet (10 mg total) by mouth 2 (two) times daily as needed. 90 tablet 1  . fluticasone (FLONASE) 50 MCG/ACT nasal spray 2 sprays by Nasal route daily.      . Ginkgo Biloba 60 MG CAPS Take 2 capsules by mouth daily.    Marland Kitchen glucose blood (ONE TOUCH ULTRA TEST) test strip 1 each by Other route daily. And lancets 1/day 250.00 100 each 12  . hydrochlorothiazide (MICROZIDE) 12.5 MG capsule Take 1 capsule (12.5 mg total) by mouth every morning. 90 capsule 3  . ibuprofen (ADVIL,MOTRIN) 600 MG tablet Take 1 tablet (600 mg total) by mouth every 6 (six) hours as needed. 90 tablet 3  . INVOKANA 100 MG TABS tablet TAKE ONE TABLET BY MOUTH ONCE DAILY. 30 tablet 0  . JANUMET 50-1000 MG tablet TAKE ONE TABLET BY MOUTH TWICE DAILY WITH FOOD 180 tablet 0  . levonorgestrel-ethinyl estradiol (AVIANE,ALESSE,LESSINA) 0.1-20 MG-MCG tablet Take 1 tablet by mouth as directed. 28 tablet 11  . loratadine (CLARITIN) 10 MG tablet Take 1 tablet (10 mg total) by mouth daily as needed for allergies. for allergies 100 tablet 3  . loratadine (CLARITIN) 10 MG tablet TAKE ONE TABLET BY MOUTH ONCE  DAILY AS NEEDED FOR ALLERGIES 90 tablet 0  . norethindrone (MICRONOR,CAMILA,ERRIN) 0.35 MG tablet TAKE ONE TABLET BY MOUTH ONCE DAILY 84 tablet 0  . pioglitazone (ACTOS) 45 MG tablet Take 1 tablet (45 mg total) by mouth daily. 90 tablet 1  . promethazine (PHENERGAN) 25 MG tablet Take 1 tablet (25 mg total) by mouth 2 (two) times daily as needed for nausea. 60 tablet 2  . ranitidine (ZANTAC) 300 MG tablet Take 1 tablet (300 mg total) by mouth daily. 90 tablet 3  . terbinafine (LAMISIL) 1 % cream Apply topically as needed.      . zolpidem (AMBIEN) 10 MG tablet Take 0.5-1 tablets (5-10 mg total) by mouth at bedtime as needed for sleep. 90 tablet 1   No facility-administered medications prior to visit.    ROS Review of Systems  Constitutional: Negative for chills, activity change, appetite change, fatigue and unexpected weight change.  HENT: Negative for congestion, mouth sores and sinus pressure.   Eyes: Negative for visual disturbance.  Respiratory: Negative for cough and chest tightness.   Gastrointestinal: Negative for nausea and abdominal pain.  Genitourinary: Negative for frequency, difficulty urinating and vaginal pain.  Musculoskeletal: Negative for back pain and gait problem.  Skin: Negative for pallor and rash.  Neurological: Negative for dizziness, tremors, weakness, numbness and headaches.  Psychiatric/Behavioral: Negative for suicidal ideas, confusion and sleep disturbance.  Objective:  BP 120/76 mmHg  Pulse 86  Wt 148 lb (67.132 kg)  SpO2 97%  BP Readings from Last 3 Encounters:  09/16/15 120/76  05/18/15 120/80  05/18/15 126/84    Wt Readings from Last 3 Encounters:  09/16/15 148 lb (67.132 kg)  05/18/15 149 lb (67.586 kg)  05/18/15 150 lb (68.04 kg)    Physical Exam  Constitutional: She appears well-developed. No distress.  HENT:  Head: Normocephalic.  Right Ear: External ear normal.  Left Ear: External ear normal.  Nose: Nose normal.  Mouth/Throat:  Oropharynx is clear and moist.  Eyes: Conjunctivae are normal. Pupils are equal, round, and reactive to light. Right eye exhibits no discharge. Left eye exhibits no discharge.  Neck: Normal range of motion. Neck supple. No JVD present. No tracheal deviation present. No thyromegaly present.  Cardiovascular: Normal rate, regular rhythm and normal heart sounds.   Pulmonary/Chest: No stridor. No respiratory distress. She has no wheezes.  Abdominal: Soft. Bowel sounds are normal. She exhibits no distension and no mass. There is no tenderness. There is no rebound and no guarding.  Musculoskeletal: She exhibits no edema or tenderness.  Lymphadenopathy:    She has no cervical adenopathy.  Neurological: She displays normal reflexes. No cranial nerve deficit. She exhibits normal muscle tone. Coordination normal.  Skin: No rash noted. No erythema.  Psychiatric: She has a normal mood and affect. Her behavior is normal. Judgment and thought content normal.    Lab Results  Component Value Date   WBC 8.4 09/16/2011   HGB 12.1 09/16/2011   HCT 37.5 09/16/2011   PLT 311.0 09/16/2011   GLUCOSE 86 05/18/2015   CHOL 166 12/27/2013   TRIG 56.0 12/27/2013   HDL 39.80 12/27/2013   LDLDIRECT 122.0 03/03/2008   LDLCALC 115* 12/27/2013   ALT 17 05/18/2015   AST 18 05/18/2015   NA 141 05/18/2015   K 4.3 05/18/2015   CL 102 05/18/2015   CREATININE 0.59 05/18/2015   BUN 15 05/18/2015   CO2 30 05/18/2015   TSH 0.63 05/18/2015   HGBA1C 5.5 05/18/2015   MICROALBUR <0.7 05/18/2015    Mm Digital Screening Bilateral  11/19/2013  CLINICAL DATA:  Screening. EXAM: DIGITAL SCREENING BILATERAL MAMMOGRAM WITH CAD COMPARISON:  Previous exam(s). ACR Breast Density Category c: The breast tissue is heterogeneously dense, which may obscure small masses. FINDINGS: There are no findings suspicious for malignancy. Images were processed with CAD. IMPRESSION: No mammographic evidence of malignancy. A result letter of this  screening mammogram will be mailed directly to the patient. RECOMMENDATION: Screening mammogram in one year. (Code:SM-B-01Y) BI-RADS CATEGORY  1: Negative. Electronically Signed   By: Lillia Mountain M.D.   On: 11/19/2013 13:31    Assessment & Plan:   There are no diagnoses linked to this encounter. I am having Ms.  maintain her fluticasone, terbinafine, Cholecalciferol, albuterol, levonorgestrel-ethinyl estradiol, glucose blood, norethindrone, cyclobenzaprine, Ginkgo Biloba, hydrochlorothiazide, zolpidem, pioglitazone, canagliflozin, bromocriptine, loratadine, promethazine, ranitidine, budesonide-formoterol, ibuprofen, loratadine, INVOKANA, and JANUMET.  No orders of the defined types were placed in this encounter.     Follow-up: No Follow-up on file.  Walker Kehr, MD

## 2015-09-16 NOTE — Assessment & Plan Note (Signed)
Invokana vs Jardiance discussed. Info given

## 2015-09-16 NOTE — Assessment & Plan Note (Signed)
  On HCTZ. Invokana.

## 2015-09-16 NOTE — Progress Notes (Signed)
Pre visit review using our clinic review tool, if applicable. No additional management support is needed unless otherwise documented below in the visit note. 

## 2015-09-16 NOTE — Assessment & Plan Note (Signed)
On B12 

## 2015-10-11 ENCOUNTER — Other Ambulatory Visit: Payer: Self-pay | Admitting: Endocrinology

## 2015-11-08 ENCOUNTER — Other Ambulatory Visit: Payer: Self-pay | Admitting: Endocrinology

## 2015-11-16 ENCOUNTER — Ambulatory Visit (INDEPENDENT_AMBULATORY_CARE_PROVIDER_SITE_OTHER): Payer: Commercial Managed Care - PPO | Admitting: Endocrinology

## 2015-11-16 ENCOUNTER — Encounter: Payer: Self-pay | Admitting: Endocrinology

## 2015-11-16 VITALS — BP 122/76 | HR 77 | Ht 70.0 in | Wt 147.0 lb

## 2015-11-16 DIAGNOSIS — E1140 Type 2 diabetes mellitus with diabetic neuropathy, unspecified: Secondary | ICD-10-CM | POA: Diagnosis not present

## 2015-11-16 LAB — POCT GLYCOSYLATED HEMOGLOBIN (HGB A1C): Hemoglobin A1C: 5.7

## 2015-11-16 NOTE — Patient Instructions (Addendum)
Please continue the same medications Please come back for a follow-up appointment in 1 year.  

## 2015-11-16 NOTE — Progress Notes (Signed)
Subjective:    Patient ID: Alexandra [name], female    DOB: July 10, 1965, 50 y.o.   MRN: [id number]  HPI Pt returns for f/u of diabetes mellitus: DM type: 2 Dx'ed: 123456 Complications: none Therapy: 5 oral meds.  GDM: never DKA: never Severe hypoglycemia: never Pancreatitis: never Other: she has never been on insulin, but she has learned how; she is presumed to be evolving type 1, based on lean body habitus, and mother's h/o type 1.   Interval history: she does not check cbg's.  pt states she feels well in general.   Past Medical History:  Diagnosis Date  . ALLERGIC RHINITIS   . Asthma   . CTS (carpal tunnel syndrome)   . Diabetes mellitus   . Glaucoma    ?  Marland Kitchen Hypertension   . Low back pain   . Osteoarthritis   . Polyarthralgia   . Vitamin B12 deficiency     No past surgical history on file.  Social History   Social History  . Marital status: Single    Spouse name: N/A  . Number of children: N/A  . Years of education: N/A   Occupational History  . 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 Smoking status: Never Smoker  . Smokeless tobacco: Not on file  . Alcohol use No  . Drug use: No  . Sexual activity: Not Currently   Other Topics Concern  . Not on file   Social History Narrative  . No narrative on file    Current Outpatient Prescriptions on File Prior to Visit  Medication Sig Dispense Refill  . albuterol (VENTOLIN HFA) 108 (90 BASE) MCG/ACT inhaler Inhale 1 puff into the lungs every 4 (four) hours as needed. 3 Inhaler 3  . budesonide-formoterol (SYMBICORT) 160-4.5 MCG/ACT inhaler Inhale 2 puffs into the lungs 2 (two) times daily. 3 Inhaler 3  . Cholecalciferol 1000 UNITS tablet Take 1 tablet (1,000 Units total) by mouth daily. 100 tablet 3  . cyclobenzaprine (FLEXERIL) 10 MG tablet Take 1 tablet (10 mg total) by mouth 2 (two) times daily as needed. 90 tablet 1  . fluticasone (FLONASE) 50 MCG/ACT nasal spray 2  sprays by Nasal route daily.      . Ginkgo Biloba 60 MG CAPS Take 2 capsules by mouth daily.    Marland Kitchen glucose blood (ONE TOUCH ULTRA TEST) test strip 1 each by Other route daily. And lancets 1/day 250.00 100 each 12  . hydrochlorothiazide (MICROZIDE) 12.5 MG capsule Take 1 capsule (12.5 mg total) by mouth every morning. 90 capsule 3  . ibuprofen (ADVIL,MOTRIN) 600 MG tablet Take 1 tablet (600 mg total) by mouth every 6 (six) hours as needed. 90 tablet 3  . INVOKANA 100 MG TABS tablet TAKE ONE TABLET BY MOUTH ONCE DAILY 30 tablet 0  . JANUMET 50-1000 MG tablet TAKE ONE TABLET BY MOUTH TWICE DAILY WITH FOOD 180 tablet 0  . levonorgestrel-ethinyl estradiol (AVIANE,ALESSE,LESSINA) 0.1-20 MG-MCG tablet Take 1 tablet by mouth as directed. 28 tablet 11  . loratadine (CLARITIN) 10 MG tablet Take 1 tablet (10 mg total) by mouth daily as needed for allergies. for allergies 100 tablet 3  . norethindrone (MICRONOR,CAMILA,ERRIN) 0.35 MG tablet TAKE ONE TABLET BY MOUTH ONCE DAILY 84 tablet 0  . pioglitazone (ACTOS) 45 MG tablet TAKE ONE TABLET BY MOUTH ONCE DAILY 90 tablet 0  . promethazine (PHENERGAN) 25 MG tablet Take 1 tablet (25 mg total) by  mouth 2 (two) times daily as needed for nausea. 60 tablet 2  . ranitidine (ZANTAC) 300 MG tablet Take 1 tablet (300 mg total) by mouth daily. 90 tablet 3  . terbinafine (LAMISIL) 1 % cream Apply topically as needed.      . bromocriptine (PARLODEL) 2.5 MG tablet Take 1 tablet (2.5 mg total) by mouth daily. 90 tablet 1  . zolpidem (AMBIEN) 10 MG tablet Take 0.5-1 tablets (5-10 mg total) by mouth at bedtime as needed for sleep. 90 tablet 1   No current facility-administered medications on file prior to visit.     Allergies  Allergen Reactions  . Benazepril Hcl     REACTION: cough  . Tramadol Hcl     REACTION: itching    Family History  Problem Relation Age of Onset  . Hypertension    . Diabetes Mother   . Kidney disease Mother 77    ESRD on HD  . Heart disease  Mother   . Cancer Father     head and neck  . Stroke Brother 45    CVAx2 on crack    BP 122/76   Pulse 77   Ht 5\' 10"  (1.778 m)   Wt 147 lb (66.7 kg)   BMI 21.09 kg/m   Review of Systems She denies hypoglycemia.     Objective:   Physical Exam VITAL SIGNS:  See vs page GENERAL: no distress Pulses: dorsalis pedis intact bilat.   MSK: no deformity of the feet CV: no leg edema Skin:  no ulcer on the feet, but there are several heavy calluses.  normal color and temp on the feet.  Neuro: sensation is intact to touch on the feet.   A1c=5.7%     Assessment & Plan:  Type 2 DM: well-controlled.

## 2015-12-06 ENCOUNTER — Other Ambulatory Visit: Payer: Self-pay | Admitting: Endocrinology

## 2015-12-13 ENCOUNTER — Other Ambulatory Visit: Payer: Self-pay | Admitting: Endocrinology

## 2016-01-03 ENCOUNTER — Other Ambulatory Visit: Payer: Self-pay | Admitting: Endocrinology

## 2016-01-10 ENCOUNTER — Other Ambulatory Visit: Payer: Self-pay | Admitting: Endocrinology

## 2016-01-18 ENCOUNTER — Encounter: Payer: Self-pay | Admitting: Internal Medicine

## 2016-01-18 ENCOUNTER — Ambulatory Visit (INDEPENDENT_AMBULATORY_CARE_PROVIDER_SITE_OTHER): Payer: Commercial Managed Care - PPO | Admitting: Internal Medicine

## 2016-01-18 ENCOUNTER — Other Ambulatory Visit (INDEPENDENT_AMBULATORY_CARE_PROVIDER_SITE_OTHER): Payer: Commercial Managed Care - PPO

## 2016-01-18 VITALS — BP 110/78 | HR 92 | Wt 148.0 lb

## 2016-01-18 DIAGNOSIS — Z0000 Encounter for general adult medical examination without abnormal findings: Secondary | ICD-10-CM | POA: Insufficient documentation

## 2016-01-18 DIAGNOSIS — I10 Essential (primary) hypertension: Secondary | ICD-10-CM | POA: Diagnosis not present

## 2016-01-18 DIAGNOSIS — E1140 Type 2 diabetes mellitus with diabetic neuropathy, unspecified: Secondary | ICD-10-CM

## 2016-01-18 DIAGNOSIS — E538 Deficiency of other specified B group vitamins: Secondary | ICD-10-CM

## 2016-01-18 DIAGNOSIS — R252 Cramp and spasm: Secondary | ICD-10-CM | POA: Diagnosis not present

## 2016-01-18 LAB — BASIC METABOLIC PANEL
BUN: 16 mg/dL (ref 6–23)
CALCIUM: 10.1 mg/dL (ref 8.4–10.5)
CO2: 29 meq/L (ref 19–32)
Chloride: 102 mEq/L (ref 96–112)
Creatinine, Ser: 0.85 mg/dL (ref 0.40–1.20)
GFR: 91.12 mL/min (ref >60.00)
Glucose, Bld: 190 mg/dL — ABNORMAL HIGH (ref 70–99)
Potassium: 4.6 mEq/L (ref 3.5–5.1)
SODIUM: 142 meq/L (ref 135–145)

## 2016-01-18 LAB — LIPID PANEL
CHOL/HDL RATIO: 3
CHOLESTEROL: 170 mg/dL (ref 0–200)
HDL: 55.7 mg/dL (ref >39.00)
LDL Cholesterol: 102 mg/dL — ABNORMAL HIGH (ref 0–99)
NonHDL: 114.43
TRIGLYCERIDES: 62 mg/dL (ref 0.0–149.0)
VLDL: 12.4 mg/dL (ref 0.0–40.0)

## 2016-01-18 LAB — MICROALBUMIN / CREATININE URINE RATIO
Creatinine,U: 101.2 mg/dL
Microalb Creat Ratio: 0.5 mg/g (ref 0.0–30.0)

## 2016-01-18 LAB — URINALYSIS
BILIRUBIN URINE: NEGATIVE
HGB URINE DIPSTICK: NEGATIVE
Ketones, ur: NEGATIVE
Leukocytes, UA: NEGATIVE
NITRITE: NEGATIVE
Specific Gravity, Urine: 1.01 (ref 1.000–1.030)
Total Protein, Urine: NEGATIVE
UROBILINOGEN UA: 0.2 (ref 0.0–1.0)
pH: 5.5 (ref 5.0–8.0)

## 2016-01-18 LAB — HEPATIC FUNCTION PANEL
ALT: 18 U/L (ref 0–35)
AST: 17 U/L (ref 0–37)
Albumin: 4.5 g/dL (ref 3.5–5.2)
Alkaline Phosphatase: 62 U/L (ref 39–117)
BILIRUBIN DIRECT: 0.1 mg/dL (ref 0.0–0.3)
BILIRUBIN TOTAL: 0.3 mg/dL (ref 0.2–1.2)
Total Protein: 7.7 g/dL (ref 6.0–8.3)

## 2016-01-18 LAB — TSH: TSH: 1.99 u[IU]/mL (ref 0.35–4.50)

## 2016-01-18 LAB — HEMOGLOBIN A1C: Hgb A1c MFr Bld: 6.4 % (ref 4.6–6.5)

## 2016-01-18 NOTE — Assessment & Plan Note (Signed)
Invokana, Janumet, Actos, Parlodel 

## 2016-01-18 NOTE — Progress Notes (Signed)
Subjective:  Patient ID: Alexandra [name], female    DOB: 07-Jan-1966  Age: 50 y.o. MRN: [id number]  CC: No chief complaint on file.   HPI Alexandra [name] presents for a well exam. F/u DM, HTN, asthma f/u. C/o leg cramps at times  Outpatient Medications Prior to Visit  Medication Sig Dispense Refill  . albuterol (VENTOLIN HFA) 108 (90 BASE) MCG/ACT inhaler Inhale 1 puff into the lungs every 4 (four) hours as needed. 3 Inhaler 3  . bromocriptine (PARLODEL) 2.5 MG tablet TAKE ONE TABLET BY MOUTH ONCE DAILY 30 tablet 11  . budesonide-formoterol (SYMBICORT) 160-4.5 MCG/ACT inhaler Inhale 2 puffs into the lungs 2 (two) times daily. 3 Inhaler 3  . Cholecalciferol 1000 UNITS tablet Take 1 tablet (1,000 Units total) by mouth daily. 100 tablet 3  . cyclobenzaprine (FLEXERIL) 10 MG tablet Take 1 tablet (10 mg total) by mouth 2 (two) times daily as needed. 90 tablet 1  . fluticasone (FLONASE) 50 MCG/ACT nasal spray 2 sprays by Nasal route daily.      . Ginkgo Biloba 60 MG CAPS Take 2 capsules by mouth daily.    Marland Kitchen glucose blood (ONE TOUCH ULTRA TEST) test strip 1 each by Other route daily. And lancets 1/day 250.00 100 each 12  . hydrochlorothiazide (MICROZIDE) 12.5 MG capsule Take 1 capsule (12.5 mg total) by mouth every morning. 90 capsule 3  . ibuprofen (ADVIL,MOTRIN) 600 MG tablet Take 1 tablet (600 mg total) by mouth every 6 (six) hours as needed. 90 tablet 3  . INVOKANA 100 MG TABS tablet TAKE ONE TABLET BY MOUTH ONCE DAILY 30 tablet 0  . JANUMET 50-1000 MG tablet TAKE ONE TABLET BY MOUTH TWICE DAILY WITH FOOD 180 tablet 0  . levonorgestrel-ethinyl estradiol (AVIANE,ALESSE,LESSINA) 0.1-20 MG-MCG tablet Take 1 tablet by mouth as directed. 28 tablet 11  . loratadine (CLARITIN) 10 MG tablet Take 1 tablet (10 mg total) by mouth daily as needed for allergies. for allergies 100 tablet 3  . norethindrone (MICRONOR,CAMILA,ERRIN) 0.35 MG tablet TAKE ONE TABLET BY MOUTH ONCE DAILY 84 tablet 0  . pioglitazone  (ACTOS) 45 MG tablet TAKE ONE TABLET BY MOUTH ONCE DAILY 90 tablet 0  . promethazine (PHENERGAN) 25 MG tablet Take 1 tablet (25 mg total) by mouth 2 (two) times daily as needed for nausea. 60 tablet 2  . ranitidine (ZANTAC) 300 MG tablet Take 1 tablet (300 mg total) by mouth daily. 90 tablet 3  . terbinafine (LAMISIL) 1 % cream Apply topically as needed.      . bromocriptine (PARLODEL) 2.5 MG tablet Take 1 tablet (2.5 mg total) by mouth daily. 90 tablet 1  . zolpidem (AMBIEN) 10 MG tablet Take 0.5-1 tablets (5-10 mg total) by mouth at bedtime as needed for sleep. 90 tablet 1   No facility-administered medications prior to visit.     ROS Review of Systems  Constitutional: Negative for activity change, appetite change, chills, fatigue and unexpected weight change.  HENT: Negative for congestion, mouth sores and sinus pressure.   Eyes: Negative for visual disturbance.  Respiratory: Negative for cough and chest tightness.   Gastrointestinal: Negative for abdominal pain and nausea.  Genitourinary: Negative for difficulty urinating, frequency and vaginal pain.  Musculoskeletal: Positive for myalgias. Negative for back pain and gait problem.  Skin: Negative for pallor and rash.  Neurological: Negative for dizziness, tremors, weakness, numbness and headaches.  Psychiatric/Behavioral: Negative for confusion, sleep disturbance and suicidal ideas.    Objective:  BP 110/78  Pulse 92   Wt 148 lb (67.1 kg)   SpO2 98%   BMI 21.24 kg/m   BP Readings from Last 3 Encounters:  01/18/16 110/78  11/16/15 122/76  09/16/15 120/76    Wt Readings from Last 3 Encounters:  01/18/16 148 lb (67.1 kg)  11/16/15 147 lb (66.7 kg)  09/16/15 148 lb (67.1 kg)    Physical Exam  Constitutional: She appears well-developed. No distress.  HENT:  Head: Normocephalic.  Right Ear: External ear normal.  Left Ear: External ear normal.  Nose: Nose normal.  Mouth/Throat: Oropharynx is clear and moist.  Eyes:  Conjunctivae are normal. Pupils are equal, round, and reactive to light. Right eye exhibits no discharge. Left eye exhibits no discharge.  Neck: Normal range of motion. Neck supple. No JVD present. No tracheal deviation present. No thyromegaly present.  Cardiovascular: Normal rate, regular rhythm and normal heart sounds.   Pulmonary/Chest: No stridor. No respiratory distress. She has no wheezes.  Abdominal: Soft. Bowel sounds are normal. She exhibits no distension and no mass. There is no tenderness. There is no rebound and no guarding.  Musculoskeletal: She exhibits no edema or tenderness.  Lymphadenopathy:    She has no cervical adenopathy.  Neurological: She displays normal reflexes. No cranial nerve deficit. She exhibits normal muscle tone. Coordination normal.  Skin: No rash noted. No erythema.  Psychiatric: She has a normal mood and affect. Her behavior is normal. Judgment and thought content normal.    Lab Results  Component Value Date   WBC 8.4 09/16/2011   HGB 12.1 09/16/2011   HCT 37.5 09/16/2011   PLT 311.0 09/16/2011   GLUCOSE 102 (H) 09/16/2015   CHOL 166 12/27/2013   TRIG 56.0 12/27/2013   HDL 39.80 12/27/2013   LDLDIRECT 122.0 03/03/2008   LDLCALC 115 (H) 12/27/2013   ALT 17 05/18/2015   AST 18 05/18/2015   NA 141 09/16/2015   K 4.7 09/16/2015   CL 104 09/16/2015   CREATININE 0.69 09/16/2015   BUN 14 09/16/2015   CO2 31 09/16/2015   TSH 0.63 05/18/2015   HGBA1C 5.7 11/16/2015   MICROALBUR <0.7 05/18/2015    Mm Digital Screening Bilateral  Result Date: 11/19/2013 CLINICAL DATA:  Screening. EXAM: DIGITAL SCREENING BILATERAL MAMMOGRAM WITH CAD COMPARISON:  Previous exam(s). ACR Breast Density Category c: The breast tissue is heterogeneously dense, which may obscure small masses. FINDINGS: There are no findings suspicious for malignancy. Images were processed with CAD. IMPRESSION: No mammographic evidence of malignancy. A result letter of this screening mammogram  will be mailed directly to the patient. RECOMMENDATION: Screening mammogram in one year. (Code:SM-B-01Y) BI-RADS CATEGORY  1: Negative. Electronically Signed   By: Lillia Mountain M.D.   On: 11/19/2013 13:31    Assessment & Plan:   There are no diagnoses linked to this encounter. I am having Ms. Clingenpeel maintain her fluticasone, terbinafine, Cholecalciferol, albuterol, levonorgestrel-ethinyl estradiol, glucose blood, norethindrone, cyclobenzaprine, Ginkgo Biloba, zolpidem, bromocriptine, promethazine, budesonide-formoterol, hydrochlorothiazide, ibuprofen, loratadine, ranitidine, bromocriptine, JANUMET, pioglitazone, and INVOKANA.  No orders of the defined types were placed in this encounter.    Follow-up: No Follow-up on file.  Walker Kehr, MD

## 2016-01-18 NOTE — Assessment & Plan Note (Signed)
Hold HCTZ to see if better

## 2016-01-18 NOTE — Progress Notes (Signed)
Pre visit review using our clinic review tool, if applicable. No additional management support is needed unless otherwise documented below in the visit note. 

## 2016-01-18 NOTE — Assessment & Plan Note (Signed)
Invokana HCTZ - hold

## 2016-01-18 NOTE — Assessment & Plan Note (Signed)
On B12 

## 2016-01-18 NOTE — Assessment & Plan Note (Signed)
We discussed age appropriate health related issues, including available/recomended screening tests and vaccinations. We discussed a need for adhering to healthy diet and exercise. Labs ordered. All questions were answered. 

## 2016-01-19 ENCOUNTER — Other Ambulatory Visit: Payer: Self-pay

## 2016-01-19 MED ORDER — CANAGLIFLOZIN 100 MG PO TABS: 100.0000 mg | ORAL_TABLET | Freq: Every day | ORAL | 1 refills | Status: DC

## 2016-03-13 ENCOUNTER — Other Ambulatory Visit: Payer: Self-pay | Admitting: Endocrinology

## 2016-03-23 DIAGNOSIS — M545 Low back pain: Secondary | ICD-10-CM | POA: Diagnosis not present

## 2016-03-23 DIAGNOSIS — M48061 Spinal stenosis, lumbar region without neurogenic claudication: Secondary | ICD-10-CM | POA: Diagnosis not present

## 2016-03-23 DIAGNOSIS — M5116 Intervertebral disc disorders with radiculopathy, lumbar region: Secondary | ICD-10-CM | POA: Diagnosis not present

## 2016-04-10 ENCOUNTER — Other Ambulatory Visit: Payer: Self-pay | Admitting: Endocrinology

## 2016-05-17 ENCOUNTER — Encounter: Payer: Self-pay | Admitting: Internal Medicine

## 2016-05-17 ENCOUNTER — Ambulatory Visit (INDEPENDENT_AMBULATORY_CARE_PROVIDER_SITE_OTHER): Payer: Commercial Managed Care - PPO | Admitting: Internal Medicine

## 2016-05-17 DIAGNOSIS — E538 Deficiency of other specified B group vitamins: Secondary | ICD-10-CM | POA: Diagnosis not present

## 2016-05-17 DIAGNOSIS — E1140 Type 2 diabetes mellitus with diabetic neuropathy, unspecified: Secondary | ICD-10-CM | POA: Diagnosis not present

## 2016-05-17 DIAGNOSIS — I10 Essential (primary) hypertension: Secondary | ICD-10-CM | POA: Diagnosis not present

## 2016-05-17 DIAGNOSIS — M5440 Lumbago with sciatica, unspecified side: Secondary | ICD-10-CM | POA: Diagnosis not present

## 2016-05-17 MED ORDER — LORATADINE 10 MG PO TABS: 10.0000 mg | ORAL_TABLET | Freq: Every day | ORAL | 3 refills | Status: DC | PRN

## 2016-05-17 MED ORDER — RANITIDINE HCL 300 MG PO TABS: 300.0000 mg | ORAL_TABLET | Freq: Every day | ORAL | 3 refills | Status: DC

## 2016-05-17 MED ORDER — PROMETHAZINE HCL 25 MG PO TABS: 25.0000 mg | ORAL_TABLET | Freq: Two times a day (BID) | ORAL | 2 refills | Status: DC | PRN

## 2016-05-17 MED ORDER — HYDROCHLOROTHIAZIDE 12.5 MG PO CAPS: 12.5000 mg | ORAL_CAPSULE | ORAL | 3 refills | Status: DC

## 2016-05-17 NOTE — Assessment & Plan Note (Signed)
HCTZ and Invokana

## 2016-05-17 NOTE — Progress Notes (Signed)
Subjective:  Patient ID: Alexandra [name], female    DOB: [birth year]  Age: 51 y.o. MRN: [id number]  CC: Follow-up (HTN, allergies may need stronger strength, DM type 2) and Back Pain (2 months, aches, nagging, radiates down right side leg )   HPI Alexandra [name] presents for LBP, DM, HTN f/u  Outpatient Medications Prior to Visit  Medication Sig Dispense Refill  . albuterol (VENTOLIN HFA) 108 (90 BASE) MCG/ACT inhaler Inhale 1 puff into the lungs every 4 (four) hours as needed. 3 Inhaler 3  . bromocriptine (PARLODEL) 2.5 MG tablet TAKE ONE TABLET BY MOUTH ONCE DAILY 30 tablet 11  . budesonide-formoterol (SYMBICORT) 160-4.5 MCG/ACT inhaler Inhale 2 puffs into the lungs 2 (two) times daily. 3 Inhaler 3  . canagliflozin (INVOKANA) 100 MG TABS tablet Take 1 tablet (100 mg total) by mouth daily. 90 tablet 1  . Cholecalciferol 1000 UNITS tablet Take 1 tablet (1,000 Units total) by mouth daily. 100 tablet 3  . cyclobenzaprine (FLEXERIL) 10 MG tablet Take 1 tablet (10 mg total) by mouth 2 (two) times daily as needed. 90 tablet 1  . fluticasone (FLONASE) 50 MCG/ACT nasal spray 2 sprays by Nasal route daily.      . Ginkgo Biloba 60 MG CAPS Take 2 capsules by mouth daily.    Marland Kitchen glucose blood (ONE TOUCH ULTRA TEST) test strip 1 each by Other route daily. And lancets 1/day 250.00 100 each 12  . hydrochlorothiazide (MICROZIDE) 12.5 MG capsule Take 1 capsule (12.5 mg total) by mouth every morning. 90 capsule 3  . ibuprofen (ADVIL,MOTRIN) 600 MG tablet Take 1 tablet (600 mg total) by mouth every 6 (six) hours as needed. 90 tablet 3  . JANUMET 50-1000 MG tablet TAKE ONE TABLET BY MOUTH TWICE DAILY WITH FOOD 180 tablet 0  . levonorgestrel-ethinyl estradiol (AVIANE,ALESSE,LESSINA) 0.1-20 MG-MCG tablet Take 1 tablet by mouth as directed. 28 tablet 11  . loratadine (CLARITIN) 10 MG tablet Take 1 tablet (10 mg total) by mouth daily as needed for allergies. for allergies 100 tablet 3  . norethindrone  (MICRONOR,CAMILA,ERRIN) 0.35 MG tablet TAKE ONE TABLET BY MOUTH ONCE DAILY 84 tablet 0  . pioglitazone (ACTOS) 45 MG tablet TAKE ONE TABLET BY MOUTH ONCE DAILY 90 tablet 0  . promethazine (PHENERGAN) 25 MG tablet Take 1 tablet (25 mg total) by mouth 2 (two) times daily as needed for nausea. 60 tablet 2  . ranitidine (ZANTAC) 300 MG tablet Take 1 tablet (300 mg total) by mouth daily. 90 tablet 3  . terbinafine (LAMISIL) 1 % cream Apply topically as needed.      . zolpidem (AMBIEN) 10 MG tablet Take 0.5-1 tablets (5-10 mg total) by mouth at bedtime as needed for sleep. 90 tablet 1  . bromocriptine (PARLODEL) 2.5 MG tablet Take 1 tablet (2.5 mg total) by mouth daily. 90 tablet 1   No facility-administered medications prior to visit.     ROS Review of Systems  Constitutional: Negative for activity change, appetite change, chills, fatigue and unexpected weight change.  HENT: Negative for congestion, mouth sores and sinus pressure.   Eyes: Negative for visual disturbance.  Respiratory: Negative for cough and chest tightness.   Gastrointestinal: Negative for abdominal pain and nausea.  Genitourinary: Negative for difficulty urinating, frequency and vaginal pain.  Musculoskeletal: Positive for back pain. Negative for gait problem.  Skin: Negative for pallor and rash.  Neurological: Negative for dizziness, tremors, weakness, numbness and headaches.  Psychiatric/Behavioral: Negative for confusion and sleep disturbance.  The patient is nervous/anxious.     Objective:  BP 118/88   Pulse 94   Temp 98.5 F (36.9 C) (Oral)   Resp 16   Ht 5\' 10"  (1.778 m)   Wt 146 lb 4 oz (66.3 kg)   SpO2 96%   BMI 20.98 kg/m   BP Readings from Last 3 Encounters:  05/17/16 118/88  01/18/16 110/78  11/16/15 122/76    Wt Readings from Last 3 Encounters:  05/17/16 146 lb 4 oz (66.3 kg)  01/18/16 148 lb (67.1 kg)  11/16/15 147 lb (66.7 kg)    Physical Exam  Constitutional: She appears well-developed. No  distress.  HENT:  Head: Normocephalic.  Right Ear: External ear normal.  Left Ear: External ear normal.  Nose: Nose normal.  Mouth/Throat: Oropharynx is clear and moist.  Eyes: Conjunctivae are normal. Pupils are equal, round, and reactive to light. Right eye exhibits no discharge. Left eye exhibits no discharge.  Neck: Normal range of motion. Neck supple. No JVD present. No tracheal deviation present. No thyromegaly present.  Cardiovascular: Normal rate, regular rhythm and normal heart sounds.   Pulmonary/Chest: No stridor. No respiratory distress. She has no wheezes.  Abdominal: Soft. Bowel sounds are normal. She exhibits no distension and no mass. There is no tenderness. There is no rebound and no guarding.  Musculoskeletal: She exhibits tenderness. She exhibits no edema.  Lymphadenopathy:    She has no cervical adenopathy.  Neurological: She displays normal reflexes. No cranial nerve deficit. She exhibits normal muscle tone. Coordination normal.  Skin: No rash noted. No erythema.  Psychiatric: She has a normal mood and affect. Her behavior is normal. Judgment and thought content normal.  LS tender w/ROM  Lab Results  Component Value Date   WBC 8.4 09/16/2011   HGB 12.1 09/16/2011   HCT 37.5 09/16/2011   PLT 311.0 09/16/2011   GLUCOSE 190 (H) 01/18/2016   CHOL 170 01/18/2016   TRIG 62.0 01/18/2016   HDL 55.70 01/18/2016   LDLDIRECT 122.0 03/03/2008   LDLCALC 102 (H) 01/18/2016   ALT 18 01/18/2016   AST 17 01/18/2016   NA 142 01/18/2016   K 4.6 01/18/2016   CL 102 01/18/2016   CREATININE 0.85 01/18/2016   BUN 16 01/18/2016   CO2 29 01/18/2016   TSH 1.99 01/18/2016   HGBA1C 6.4 01/18/2016   MICROALBUR <0.5 01/18/2016    Mm Digital Screening Bilateral  Result Date: 11/18/2013 CLINICAL DATA:  Screening. EXAM: DIGITAL SCREENING BILATERAL MAMMOGRAM WITH CAD COMPARISON:  Previous exam(s). ACR Breast Density Category c: The breast tissue is heterogeneously dense, which may  obscure small masses. FINDINGS: There are no findings suspicious for malignancy. Images were processed with CAD. IMPRESSION: No mammographic evidence of malignancy. A result letter of this screening mammogram will be mailed directly to the patient. RECOMMENDATION: Screening mammogram in one year. (Code:SM-B-01Y) BI-RADS CATEGORY  1: Negative. Electronically Signed   By: Lillia Mountain M.D.   On: 11/19/2013 13:31    Assessment & Plan:   There are no diagnoses linked to this encounter. I am having Ms. Huskins maintain her fluticasone, terbinafine, Cholecalciferol, albuterol, levonorgestrel-ethinyl estradiol, glucose blood, norethindrone, cyclobenzaprine, Ginkgo Biloba, zolpidem, promethazine, budesonide-formoterol, hydrochlorothiazide, ibuprofen, loratadine, ranitidine, bromocriptine, canagliflozin, JANUMET, and pioglitazone.  No orders of the defined types were placed in this encounter.    Follow-up: No Follow-up on file.  Walker Kehr, MD

## 2016-05-17 NOTE — Assessment & Plan Note (Addendum)
Discussed  Refused injections Vit D

## 2016-05-17 NOTE — Assessment & Plan Note (Signed)
No change in Meds

## 2016-05-17 NOTE — Assessment & Plan Note (Signed)
Vit B12 

## 2016-05-17 NOTE — Progress Notes (Signed)
Pre-visit discussion using our clinic review tool. No additional management support is needed unless otherwise documented below in the visit note.  

## 2016-05-18 ENCOUNTER — Ambulatory Visit: Payer: Commercial Managed Care - PPO | Admitting: Internal Medicine

## 2016-05-25 ENCOUNTER — Telehealth: Payer: Self-pay

## 2016-05-25 NOTE — Telephone Encounter (Signed)
PA Symbicort sent to plan Key: [id number] Case id: [id number]

## 2016-05-26 ENCOUNTER — Other Ambulatory Visit: Payer: Self-pay | Admitting: Internal Medicine

## 2016-06-12 ENCOUNTER — Other Ambulatory Visit: Payer: Self-pay | Admitting: Endocrinology

## 2016-07-17 ENCOUNTER — Other Ambulatory Visit: Payer: Self-pay | Admitting: Endocrinology

## 2016-09-14 ENCOUNTER — Ambulatory Visit: Payer: Commercial Managed Care - PPO | Admitting: Internal Medicine

## 2016-09-15 ENCOUNTER — Other Ambulatory Visit: Payer: Self-pay | Admitting: Endocrinology

## 2016-09-23 ENCOUNTER — Other Ambulatory Visit: Payer: Self-pay | Admitting: Internal Medicine

## 2016-10-07 ENCOUNTER — Encounter: Payer: Self-pay | Admitting: Internal Medicine

## 2016-10-07 ENCOUNTER — Telehealth: Payer: Self-pay

## 2016-10-07 ENCOUNTER — Ambulatory Visit (INDEPENDENT_AMBULATORY_CARE_PROVIDER_SITE_OTHER): Payer: Commercial Managed Care - PPO | Admitting: Internal Medicine

## 2016-10-07 ENCOUNTER — Ambulatory Visit (INDEPENDENT_AMBULATORY_CARE_PROVIDER_SITE_OTHER)
Admission: RE | Admit: 2016-10-07 | Discharge: 2016-10-07 | Disposition: A | Discharge status: Home / Self Care | Payer: Commercial Managed Care - PPO | Source: Ambulatory Visit | Attending: Internal Medicine | Admitting: Internal Medicine

## 2016-10-07 VITALS — BP 110/76 | Ht 70.0 in | Wt 152.0 lb

## 2016-10-07 DIAGNOSIS — M79672 Pain in left foot: Secondary | ICD-10-CM | POA: Diagnosis not present

## 2016-10-07 DIAGNOSIS — I10 Essential (primary) hypertension: Secondary | ICD-10-CM

## 2016-10-07 DIAGNOSIS — M7732 Calcaneal spur, left foot: Secondary | ICD-10-CM | POA: Diagnosis not present

## 2016-10-07 MED ORDER — IBUPROFEN 600 MG PO TABS: 600.0000 mg | ORAL_TABLET | Freq: Four times a day (QID) | ORAL | 3 refills | Status: DC | PRN

## 2016-10-07 NOTE — Telephone Encounter (Signed)
-----   Message from Biagio Borg, MD sent at 10/07/2016 12:32 PM EDT ----- Left message on MyChart, pt to cont same tx, except  The test results show that your current treatment is OK, as there was no fracture seen. .    There is no other need for change of treatment or further evaluation based on these results, at this time.  Please continue the ibuprofen and the steel toed boots, and consider making an appointment with Dr Tamala Julian (sports medicine) in this office if not improved in a few days.  Lasya Vetter to please inform pt, and help her make appt with Dr Tamala Julian if she wants

## 2016-10-07 NOTE — Telephone Encounter (Signed)
Pt has been informed and expressed understanding.  

## 2016-10-07 NOTE — Progress Notes (Signed)
Subjective:    Patient ID: Alexandra [name], female    DOB: Sep 11, 1965, 51 y.o.   MRN: [id number]  HPI  Here with left mid foot and great toe pain and swelling persistent x 3 wks after dropping a heavy wooden skin to the LLE at work.  There was also an abrasion just distal and lateral to the knee now essentially healed with few scab left.  Pain is mild to mod, sharp, intermittent, worse to walk, better to sit.  Nothing else makes better or worse.  Pt denies chest pain, increased sob or doe, wheezing, orthopnea, PND, increased LE swelling, palpitations, dizziness or syncope.   Past Medical History:  Diagnosis Date  . ALLERGIC RHINITIS   . Asthma   . CTS (carpal tunnel syndrome)   . Diabetes mellitus   . Glaucoma    ?  Marland Kitchen Hypertension   . Low back pain   . Osteoarthritis   . Polyarthralgia   . Vitamin B12 deficiency    No past surgical history on file.  reports that she has never smoked. She has never used smokeless tobacco. She reports that she does not drink alcohol or use drugs. family history includes Cancer in her father; Diabetes in her mother; Heart disease in her mother; Hypertension in her unknown relative; Kidney disease (age of onset: 59) in her mother; Stroke (age of onset: 11) in her brother. Allergies  Allergen Reactions  . Benazepril Hcl     REACTION: cough  . Tramadol Hcl     REACTION: itching   Current Outpatient Prescriptions on File Prior to Visit  Medication Sig Dispense Refill  . albuterol (VENTOLIN HFA) 108 (90 BASE) MCG/ACT inhaler Inhale 1 puff into the lungs every 4 (four) hours as needed. 3 Inhaler 3  . bromocriptine (PARLODEL) 2.5 MG tablet TAKE ONE TABLET BY MOUTH ONCE DAILY 30 tablet 11  . canagliflozin (INVOKANA) 100 MG TABS tablet Take 1 tablet (100 mg total) by mouth daily. 90 tablet 1  . Cholecalciferol 1000 UNITS tablet Take 1 tablet (1,000 Units total) by mouth daily. 100 tablet 3  . cyclobenzaprine (FLEXERIL) 10 MG tablet Take 1 tablet (10 mg total)  by mouth 2 (two) times daily as needed. 90 tablet 1  . fluticasone (FLONASE) 50 MCG/ACT nasal spray 2 sprays by Nasal route daily.      . Ginkgo Biloba 60 MG CAPS Take 2 capsules by mouth daily.    Marland Kitchen glucose blood (ONE TOUCH ULTRA TEST) test strip 1 each by Other route daily. And lancets 1/day 250.00 100 each 12  . hydrochlorothiazide (MICROZIDE) 12.5 MG capsule Take 1 capsule (12.5 mg total) by mouth every morning. 90 capsule 3  . JANUMET 50-1000 MG tablet TAKE 1 TABLET BY MOUTH TWICE DAILY WITH FOOD 180 tablet 0  . levonorgestrel-ethinyl estradiol (AVIANE,ALESSE,LESSINA) 0.1-20 MG-MCG tablet Take 1 tablet by mouth as directed. 28 tablet 11  . loratadine (CLARITIN) 10 MG tablet Take 1 tablet (10 mg total) by mouth daily as needed for allergies. for allergies 100 tablet 3  . norethindrone (MICRONOR,CAMILA,ERRIN) 0.35 MG tablet TAKE ONE TABLET BY MOUTH ONCE DAILY 84 tablet 0  . pioglitazone (ACTOS) 45 MG tablet TAKE ONE TABLET BY MOUTH ONCE DAILY 90 tablet 0  . promethazine (PHENERGAN) 25 MG tablet Take 1 tablet (25 mg total) by mouth 2 (two) times daily as needed for nausea. 60 tablet 2  . ranitidine (ZANTAC) 300 MG tablet Take 1 tablet (300 mg total) by mouth daily. 90 tablet  3  . SYMBICORT 160-4.5 MCG/ACT inhaler INHALE TWO PUFFS BY MOUTH TWICE DAILY 1 Inhaler 11  . terbinafine (LAMISIL) 1 % cream Apply topically as needed.      . budesonide-formoterol (SYMBICORT) 160-4.5 MCG/ACT inhaler Inhale 2 puffs into the lungs 2 (two) times daily. 3 Inhaler 3  . zolpidem (AMBIEN) 10 MG tablet Take 0.5-1 tablets (5-10 mg total) by mouth at bedtime as needed for sleep. 90 tablet 1   No current facility-administered medications on file prior to visit.    Review of Systems All otherwise neg per pt    Objective:   Physical Exam BP 110/76   Ht 5\' 10"  (1.778 m)   Wt 152 lb (68.9 kg)   BMI 21.81 kg/m  VS noted,  Constitutional: Pt appears in NAD HENT: Head: NCAT.  Right Ear: External ear normal.    Left Ear: External ear normal.  Eyes: . Pupils are equal, round, and reactive to light. Conjunctivae and EOM are normal Nose: without d/c or deformity Neck: Neck supple. Gross normal ROM Cardiovascular: Normal rate and regular rhythm.   Pulmonary/Chest: Effort normal and breath sounds without rales or wheezing.  Neurological: Pt is alert. At baseline orientation, motor grossly intact Skin: Skin is warm. No rashes, has minor scabbing left to < 1 cm area just distal and lateral to the left knee, no LE edema except for 1+ swelling and tender to nondiscrete area but involving primarily the mid foot and great toes, without erythema, ulcer or other skin change  Psychiatric: Pt behavior is normal without agitation  No other exam findings    Assessment & Plan:

## 2016-10-07 NOTE — Patient Instructions (Signed)
Please take all new medication as prescribed - the ibuprofen (sent to local pharmacy)  Please continue the steel toe boots to avoid further injury  Please continue all other medications as before, and refills have been done if requested.  Please have the pharmacy call with any other refills you may need.  Please keep your appointments with your specialists as you may have planned  Please go to the XRAY Department in the Basement (go straight as you get off the elevator) for the x-ray testing  You will be contacted by phone if any changes need to be made immediately.  Otherwise, you will receive a letter about your results with an explanation, but please check with MyChart first.  Please remember to sign up for MyChart if you have not done so, as this will be important to you in the future with finding out test results, communicating by private email, and scheduling acute appointments online when needed.

## 2016-10-09 ENCOUNTER — Other Ambulatory Visit: Payer: Self-pay | Admitting: Endocrinology

## 2016-10-09 NOTE — Assessment & Plan Note (Signed)
Mod persistent now 3 wks after struck by heavy wooden skin; cant r/o fx - for film today, pain control,  to f/u any worsening symptoms or concerns

## 2016-10-09 NOTE — Assessment & Plan Note (Signed)
stable overall by history and exam, recent data reviewed with pt, and pt to continue medical treatment as before,  to f/u any worsening symptoms or concerns BP Readings from Last 3 Encounters:  10/07/16 110/76  05/17/16 118/88  01/18/16 110/78

## 2016-10-09 NOTE — Telephone Encounter (Signed)
Please refill x 2 months Ov is due

## 2016-10-10 ENCOUNTER — Other Ambulatory Visit: Payer: Self-pay

## 2016-10-10 MED ORDER — PIOGLITAZONE HCL 45 MG PO TABS: 45.0000 mg | ORAL_TABLET | Freq: Every day | ORAL | 0 refills | Status: DC

## 2016-10-16 ENCOUNTER — Other Ambulatory Visit: Payer: Self-pay | Admitting: Endocrinology

## 2016-10-16 NOTE — Telephone Encounter (Signed)
Please refill x 2 mos Ov is due 

## 2016-10-17 ENCOUNTER — Ambulatory Visit: Payer: Commercial Managed Care - PPO | Admitting: Internal Medicine

## 2016-10-27 ENCOUNTER — Other Ambulatory Visit (INDEPENDENT_AMBULATORY_CARE_PROVIDER_SITE_OTHER): Payer: Commercial Managed Care - PPO

## 2016-10-27 ENCOUNTER — Ambulatory Visit (INDEPENDENT_AMBULATORY_CARE_PROVIDER_SITE_OTHER): Payer: Commercial Managed Care - PPO | Admitting: Internal Medicine

## 2016-10-27 ENCOUNTER — Encounter: Payer: Self-pay | Admitting: Internal Medicine

## 2016-10-27 DIAGNOSIS — E1140 Type 2 diabetes mellitus with diabetic neuropathy, unspecified: Secondary | ICD-10-CM

## 2016-10-27 DIAGNOSIS — J4520 Mild intermittent asthma, uncomplicated: Secondary | ICD-10-CM | POA: Diagnosis not present

## 2016-10-27 DIAGNOSIS — I10 Essential (primary) hypertension: Secondary | ICD-10-CM | POA: Diagnosis not present

## 2016-10-27 DIAGNOSIS — E119 Type 2 diabetes mellitus without complications: Secondary | ICD-10-CM | POA: Diagnosis not present

## 2016-10-27 DIAGNOSIS — E538 Deficiency of other specified B group vitamins: Secondary | ICD-10-CM

## 2016-10-27 LAB — BASIC METABOLIC PANEL
BUN: 10 mg/dL (ref 6–23)
CALCIUM: 9.4 mg/dL (ref 8.4–10.5)
CO2: 33 mEq/L — ABNORMAL HIGH (ref 19–32)
CREATININE: 0.71 mg/dL (ref 0.40–1.20)
Chloride: 101 mEq/L (ref 96–112)
GFR: 111.8 mL/min (ref >60.00)
Glucose, Bld: 96 mg/dL (ref 70–99)
Potassium: 4.2 mEq/L (ref 3.5–5.1)
Sodium: 140 mEq/L (ref 135–145)

## 2016-10-27 LAB — HEMOGLOBIN A1C: HEMOGLOBIN A1C: 6.5 % (ref 4.6–6.5)

## 2016-10-27 MED ORDER — FLUTICASONE FUROATE-VILANTEROL 100-25 MCG/INH IN AEPB: 1.0000 | INHALATION_SPRAY | Freq: Every day | RESPIRATORY_TRACT | 5 refills | Status: DC

## 2016-10-27 MED ORDER — CYCLOBENZAPRINE HCL 10 MG PO TABS: 10.0000 mg | ORAL_TABLET | Freq: Two times a day (BID) | ORAL | 1 refills | Status: DC | PRN

## 2016-10-27 MED ORDER — RANITIDINE HCL 300 MG PO TABS: 300.0000 mg | ORAL_TABLET | Freq: Every day | ORAL | 3 refills | Status: DC

## 2016-10-27 MED ORDER — LORATADINE 10 MG PO TABS: 10.0000 mg | ORAL_TABLET | Freq: Every day | ORAL | 3 refills | Status: DC | PRN

## 2016-10-27 MED ORDER — PROMETHAZINE HCL 25 MG PO TABS: 25.0000 mg | ORAL_TABLET | Freq: Two times a day (BID) | ORAL | 2 refills | Status: DC | PRN

## 2016-10-27 MED ORDER — HYDROCHLOROTHIAZIDE 12.5 MG PO CAPS: 12.5000 mg | ORAL_CAPSULE | ORAL | 3 refills | Status: DC

## 2016-10-27 MED ORDER — NORETHINDRONE 0.35 MG PO TABS: 1.0000 | ORAL_TABLET | Freq: Every day | ORAL | 0 refills | Status: DC

## 2016-10-27 NOTE — Assessment & Plan Note (Signed)
HCTZ, Invokana 

## 2016-10-27 NOTE — Assessment & Plan Note (Signed)
D/c Symbicort - not covered [employer]

## 2016-10-27 NOTE — Assessment & Plan Note (Signed)
Invokana, Janumet, Actos, Parlodel

## 2016-10-27 NOTE — Progress Notes (Signed)
Subjective:  Patient ID: Alexandra [name], female    DOB: [birth year]  Age: 51 y.o. MRN: [id number]  CC: No chief complaint on file.   HPI Alexandra [name] presents for DM, HTN, insomnia, asthma f/u. Symbicort is not covered any longer  Outpatient Medications Prior to Visit  Medication Sig Dispense Refill  . albuterol (VENTOLIN HFA) 108 (90 BASE) MCG/ACT inhaler Inhale 1 puff into the lungs every 4 (four) hours as needed. 3 Inhaler 3  . bromocriptine (PARLODEL) 2.5 MG tablet TAKE ONE TABLET BY MOUTH ONCE DAILY 30 tablet 11  . canagliflozin (INVOKANA) 100 MG TABS tablet Take 1 tablet (100 mg total) by mouth daily. 90 tablet 1  . Cholecalciferol 1000 UNITS tablet Take 1 tablet (1,000 Units total) by mouth daily. 100 tablet 3  . cyclobenzaprine (FLEXERIL) 10 MG tablet Take 1 tablet (10 mg total) by mouth 2 (two) times daily as needed. 90 tablet 1  . fluticasone (FLONASE) 50 MCG/ACT nasal spray 2 sprays by Nasal route daily.      . Ginkgo Biloba 60 MG CAPS Take 2 capsules by mouth daily.    Marland Kitchen glucose blood (ONE TOUCH ULTRA TEST) test strip 1 each by Other route daily. And lancets 1/day 250.00 100 each 12  . hydrochlorothiazide (MICROZIDE) 12.5 MG capsule Take 1 capsule (12.5 mg total) by mouth every morning. 90 capsule 3  . ibuprofen (ADVIL,MOTRIN) 600 MG tablet Take 1 tablet (600 mg total) by mouth every 6 (six) hours as needed. 90 tablet 3  . JANUMET 50-1000 MG tablet TAKE 1 TABLET BY MOUTH TWICE DAILY WITH FOOD 180 tablet 0  . levonorgestrel-ethinyl estradiol (AVIANE,ALESSE,LESSINA) 0.1-20 MG-MCG tablet Take 1 tablet by mouth as directed. 28 tablet 11  . loratadine (CLARITIN) 10 MG tablet Take 1 tablet (10 mg total) by mouth daily as needed for allergies. for allergies 100 tablet 3  . norethindrone (MICRONOR,CAMILA,ERRIN) 0.35 MG tablet TAKE ONE TABLET BY MOUTH ONCE DAILY 84 tablet 0  . pioglitazone (ACTOS) 45 MG tablet Take 1 tablet (45 mg total) by mouth daily. 90 tablet 0  . promethazine  (PHENERGAN) 25 MG tablet Take 1 tablet (25 mg total) by mouth 2 (two) times daily as needed for nausea. 60 tablet 2  . ranitidine (ZANTAC) 300 MG tablet Take 1 tablet (300 mg total) by mouth daily. 90 tablet 3  . terbinafine (LAMISIL) 1 % cream Apply topically as needed.      . budesonide-formoterol (SYMBICORT) 160-4.5 MCG/ACT inhaler Inhale 2 puffs into the lungs 2 (two) times daily. 3 Inhaler 3  . zolpidem (AMBIEN) 10 MG tablet Take 0.5-1 tablets (5-10 mg total) by mouth at bedtime as needed for sleep. 90 tablet 1  . pioglitazone (ACTOS) 45 MG tablet TAKE 1 TABLET BY MOUTH ONCE DAILY 90 tablet 0  . SYMBICORT 160-4.5 MCG/ACT inhaler INHALE TWO PUFFS BY MOUTH TWICE DAILY 1 Inhaler 11   No facility-administered medications prior to visit.     ROS Review of Systems  Constitutional: Negative for activity change, appetite change, chills, fatigue and unexpected weight change.  HENT: Negative for congestion, mouth sores and sinus pressure.   Eyes: Negative for visual disturbance.  Respiratory: Negative for cough and chest tightness.   Gastrointestinal: Negative for abdominal pain and nausea.  Genitourinary: Negative for difficulty urinating, frequency and vaginal pain.  Musculoskeletal: Negative for back pain and gait problem.  Skin: Negative for pallor and rash.  Neurological: Negative for dizziness, tremors, weakness, numbness and headaches.  Psychiatric/Behavioral: Negative for  confusion, sleep disturbance and suicidal ideas.    Objective:  BP 112/78 (BP Location: Left Arm, Patient Position: Sitting, Cuff Size: Normal)   Pulse 76   Temp 97.8 F (36.6 C) (Oral)   Ht 5\' 10"  (1.778 m)   Wt 150 lb (68 kg)   SpO2 100%   BMI 21.52 kg/m   BP Readings from Last 3 Encounters:  10/27/16 112/78  10/07/16 110/76  05/17/16 118/88    Wt Readings from Last 3 Encounters:  10/27/16 150 lb (68 kg)  10/07/16 152 lb (68.9 kg)  05/17/16 146 lb 4 oz (66.3 kg)    Physical Exam  Constitutional:  She appears well-developed. No distress.  HENT:  Head: Normocephalic.  Right Ear: External ear normal.  Left Ear: External ear normal.  Nose: Nose normal.  Mouth/Throat: Oropharynx is clear and moist.  Eyes: Pupils are equal, round, and reactive to light. Conjunctivae are normal. Right eye exhibits no discharge. Left eye exhibits no discharge.  Neck: Normal range of motion. Neck supple. No JVD present. No tracheal deviation present. No thyromegaly present.  Cardiovascular: Normal rate, regular rhythm and normal heart sounds.   Pulmonary/Chest: No stridor. No respiratory distress. She has no wheezes.  Abdominal: Soft. Bowel sounds are normal. She exhibits no distension and no mass. There is no tenderness. There is no rebound and no guarding.  Musculoskeletal: She exhibits no edema or tenderness.  Lymphadenopathy:    She has no cervical adenopathy.  Neurological: She displays normal reflexes. No cranial nerve deficit. She exhibits normal muscle tone. Coordination normal.  Skin: No rash noted. No erythema.  Psychiatric: She has a normal mood and affect. Her behavior is normal. Judgment and thought content normal.   I personally provided BREO inhaler use teaching. After the teaching patient was able to demonstrate it's use effectively. All questions were answered  Lab Results  Component Value Date   WBC 8.4 09/16/2011   HGB 12.1 09/16/2011   HCT 37.5 09/16/2011   PLT 311.0 09/16/2011   GLUCOSE 190 (H) 01/18/2016   CHOL 170 01/18/2016   TRIG 62.0 01/18/2016   HDL 55.70 01/18/2016   LDLDIRECT 122.0 03/03/2008   LDLCALC 102 (H) 01/18/2016   ALT 18 01/18/2016   AST 17 01/18/2016   NA 142 01/18/2016   K 4.6 01/18/2016   CL 102 01/18/2016   CREATININE 0.85 01/18/2016   BUN 16 01/18/2016   CO2 29 01/18/2016   TSH 1.99 01/18/2016   HGBA1C 6.4 01/18/2016   MICROALBUR <0.5 01/18/2016    Dg Foot Complete Left  Result Date: 10/07/2016 CLINICAL DATA:  Dropped heavy object on foot with  pain and swelling EXAM: LEFT FOOT - COMPLETE 3+ VIEW COMPARISON:  None. FINDINGS: Frontal, oblique, and lateral views obtained. There is no fracture or dislocation. Joint spaces appear normal. No erosive change. There is a posterior calcaneal spur. IMPRESSION: Posterior calcaneal spur. No fracture or dislocation. No appreciable joint space narrowing or erosion. Electronically Signed   By: Lowella Grip III M.D.   On: 10/07/2016 12:11    Assessment & Plan:   There are no diagnoses linked to this encounter. I have discontinued Ms. Aubuchon's SYMBICORT. I am also having her maintain her fluticasone, terbinafine, Cholecalciferol, albuterol, levonorgestrel-ethinyl estradiol, glucose blood, norethindrone, cyclobenzaprine, Ginkgo Biloba, zolpidem, budesonide-formoterol, bromocriptine, canagliflozin, hydrochlorothiazide, loratadine, promethazine, ranitidine, JANUMET, ibuprofen, and pioglitazone.  No orders of the defined types were placed in this encounter.    Follow-up: No Follow-up on file.  Walker Kehr, MD

## 2016-10-27 NOTE — Assessment & Plan Note (Signed)
On B12 

## 2016-10-27 NOTE — Assessment & Plan Note (Signed)
Vit D Ibuprofen

## 2016-11-15 ENCOUNTER — Other Ambulatory Visit: Payer: Self-pay

## 2016-11-15 ENCOUNTER — Ambulatory Visit (INDEPENDENT_AMBULATORY_CARE_PROVIDER_SITE_OTHER): Payer: Commercial Managed Care - PPO | Admitting: Endocrinology

## 2016-11-15 ENCOUNTER — Telehealth: Payer: Self-pay | Admitting: Endocrinology

## 2016-11-15 ENCOUNTER — Encounter: Payer: Self-pay | Admitting: Endocrinology

## 2016-11-15 VITALS — BP 110/72 | HR 92 | Wt 149.2 lb

## 2016-11-15 DIAGNOSIS — E1140 Type 2 diabetes mellitus with diabetic neuropathy, unspecified: Secondary | ICD-10-CM | POA: Diagnosis not present

## 2016-11-15 LAB — POCT GLYCOSYLATED HEMOGLOBIN (HGB A1C): HEMOGLOBIN A1C: 5.6

## 2016-11-15 MED ORDER — SITAGLIPTIN PHOS-METFORMIN HCL 50-1000 MG PO TABS: 1.0000 | ORAL_TABLET | Freq: Two times a day (BID) | ORAL | 0 refills | Status: DC

## 2016-11-15 MED ORDER — BROMOCRIPTINE MESYLATE 2.5 MG PO TABS: 2.5000 mg | ORAL_TABLET | Freq: Every day | ORAL | 11 refills | Status: DC

## 2016-11-15 MED ORDER — GLUCOSE BLOOD VI STRP: ORAL_STRIP | 3 refills | Status: DC

## 2016-11-15 MED ORDER — PIOGLITAZONE HCL 45 MG PO TABS: 45.0000 mg | ORAL_TABLET | Freq: Every day | ORAL | 0 refills | Status: DC

## 2016-11-15 NOTE — Progress Notes (Signed)
Subjective:    Patient ID: Alexandra [name], female    DOB: 10-11-1965, 51 y.o.   MRN: [id number]  HPI Pt returns for f/u of diabetes mellitus: DM type: 2 (she is presumed to be evolving type 1, based on lean body habitus, and mother's h/o type 1) Dx'ed: 2595 Complications: none Therapy: 5 oral meds.  GDM: never DKA: never Severe hypoglycemia: never Pancreatitis: never Other: she has never been on insulin, but she has learned how.  Interval history: she does not check cbg's.  pt states she feels well in general.  Past Medical History:  Diagnosis Date  . ALLERGIC RHINITIS   . Asthma   . CTS (carpal tunnel syndrome)   . Diabetes mellitus   . Glaucoma    ?  Marland Kitchen Hypertension   . Low back pain   . Osteoarthritis   . Polyarthralgia   . Vitamin B12 deficiency     No past surgical history on file.  Social History   Social History  . Marital status: Single    Spouse name: N/A  . Number of children: N/A  . Years of education: N/A   Occupational History  . 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 Smoking status: Never Smoker  . Smokeless tobacco: Never Used  . Alcohol use No  . Drug use: No  . Sexual activity: Not Currently   Other Topics Concern  . Not on file   Social History Narrative  . No narrative on file    Current Outpatient Prescriptions on File Prior to Visit  Medication Sig Dispense Refill  . albuterol (VENTOLIN HFA) 108 (90 BASE) MCG/ACT inhaler Inhale 1 puff into the lungs every 4 (four) hours as needed. 3 Inhaler 3  . bromocriptine (PARLODEL) 2.5 MG tablet TAKE ONE TABLET BY MOUTH ONCE DAILY 30 tablet 11  . canagliflozin (INVOKANA) 100 MG TABS tablet Take 1 tablet (100 mg total) by mouth daily. 90 tablet 1  . Cholecalciferol 1000 UNITS tablet Take 1 tablet (1,000 Units total) by mouth daily. 100 tablet 3  . cyclobenzaprine (FLEXERIL) 10 MG tablet Take 1 tablet (10 mg total) by mouth 2 (two) times daily as  needed. 90 tablet 1  . fluticasone (FLONASE) 50 MCG/ACT nasal spray 2 sprays by Nasal route daily.      . fluticasone furoate-vilanterol (BREO ELLIPTA) 100-25 MCG/INH AEPB Inhale 1 puff into the lungs daily. 1 each 5  . Ginkgo Biloba 60 MG CAPS Take 2 capsules by mouth daily.    Marland Kitchen glucose blood (ONE TOUCH ULTRA TEST) test strip 1 each by Other route daily. And lancets 1/day 250.00 100 each 12  . hydrochlorothiazide (MICROZIDE) 12.5 MG capsule Take 1 capsule (12.5 mg total) by mouth every morning. 90 capsule 3  . ibuprofen (ADVIL,MOTRIN) 600 MG tablet Take 1 tablet (600 mg total) by mouth every 6 (six) hours as needed. 90 tablet 3  . JANUMET 50-1000 MG tablet TAKE 1 TABLET BY MOUTH TWICE DAILY WITH FOOD 180 tablet 0  . levonorgestrel-ethinyl estradiol (AVIANE,ALESSE,LESSINA) 0.1-20 MG-MCG tablet Take 1 tablet by mouth as directed. 28 tablet 11  . loratadine (CLARITIN) 10 MG tablet Take 1 tablet (10 mg total) by mouth daily as needed for allergies. for allergies 100 tablet 3  . norethindrone (MICRONOR,CAMILA,ERRIN) 0.35 MG tablet Take 1 tablet (0.35 mg total) by mouth daily. 84 tablet 0  . pioglitazone (ACTOS) 45 MG tablet Take 1 tablet (45 mg total)  by mouth daily. 90 tablet 0  . promethazine (PHENERGAN) 25 MG tablet Take 1 tablet (25 mg total) by mouth 2 (two) times daily as needed for nausea. 60 tablet 2  . ranitidine (ZANTAC) 300 MG tablet Take 1 tablet (300 mg total) by mouth daily. 90 tablet 3  . terbinafine (LAMISIL) 1 % cream Apply topically as needed.      . zolpidem (AMBIEN) 10 MG tablet Take 0.5-1 tablets (5-10 mg total) by mouth at bedtime as needed for sleep. 90 tablet 1   No current facility-administered medications on file prior to visit.     Allergies  Allergen Reactions  . Benazepril Hcl     REACTION: cough  . Tramadol Hcl     REACTION: itching    Family History  Problem Relation Age of Onset  . Diabetes Mother   . Kidney disease Mother 79       ESRD on HD  . Heart  disease Mother   . Cancer Father        head and neck  . Hypertension Unknown   . Stroke Brother 45       CVAx2 on crack    BP 110/72   Pulse 92   Wt 149 lb 3.2 oz (67.7 kg)   SpO2 98%   BMI 21.41 kg/m    Review of Systems She denies hypoglycemia    Objective:   Physical Exam VITAL SIGNS:  See vs page GENERAL: no distress Pulses: foot pulses are intact bilaterally.   MSK: no deformity of the feet or ankles.  CV: no edema of the legs or ankles Skin:  no ulcer on the feet or ankles.  normal color and temp on the feet and ankles Neuro: sensation is intact to touch on the feet and ankles.     Lab Results  Component Value Date   HGBA1C 6.5 10/27/2016       Assessment & Plan:  Type 2 DM: well-controlled Lean body habitus: she is at risk for type 2 DM.  Patient Instructions  Please continue the same medications Please come back for a follow-up appointment in 1 year.

## 2016-11-15 NOTE — Patient Instructions (Addendum)
Please continue the same medications.   check your blood sugar once a day.  vary the time of day when you check, between before the 3 meals, and at bedtime.  also check if you have symptoms of your blood sugar being too high or too low.  please keep a record of the readings and bring it to your next appointment here (or you can bring the meter itself).  You can write it on any piece of paper.  please call us sooner if your blood sugar goes below 70, or if you have a lot of readings over 200.   Please come back for a follow-up appointment in 1 year.   

## 2016-11-15 NOTE — Telephone Encounter (Signed)
MEDICATION:   JANUMET 50-1000 MG tablet    pioglitazone (ACTOS) 45 MG tablet   bromocriptine (PARLODEL) 2.5 MG tablet  PHARMACY:  [facility] ([facility]), [place] - [address] [phone] (Phone) [phone] (Fax)   IS THIS A 90 DAY SUPPLY : no  IS PATIENT OUT OF MEDICATION: no  IF NOT; HOW MUCH IS LEFT: n/a  LAST APPOINTMENT DATE:11/15/16  NEXT APPOINTMENT DATE:11/15/17  OTHER COMMENTS: Patient forgot to mention that she needs refills at her visit today.   **Let patient know to contact pharmacy at the end of the day to make sure medication is ready. **  ** Please notify patient to allow 48-72 hours to process**  **Encourage patient to contact the pharmacy for refills or they can request refills through [facility]**

## 2016-11-18 ENCOUNTER — Telehealth: Payer: Self-pay | Admitting: Endocrinology

## 2016-11-18 ENCOUNTER — Other Ambulatory Visit: Payer: Self-pay

## 2016-11-18 MED ORDER — CANAGLIFLOZIN 100 MG PO TABS: 100.0000 mg | ORAL_TABLET | Freq: Every day | ORAL | 1 refills | Status: DC

## 2016-11-18 MED ORDER — PIOGLITAZONE HCL 45 MG PO TABS: 45.0000 mg | ORAL_TABLET | Freq: Every day | ORAL | 0 refills | Status: DC

## 2016-11-18 MED ORDER — SITAGLIPTIN PHOS-METFORMIN HCL 50-1000 MG PO TABS: 1.0000 | ORAL_TABLET | Freq: Two times a day (BID) | ORAL | 0 refills | Status: DC

## 2016-11-18 MED ORDER — BROMOCRIPTINE MESYLATE 2.5 MG PO TABS: 2.5000 mg | ORAL_TABLET | Freq: Every day | ORAL | 11 refills | Status: DC

## 2016-11-18 NOTE — Telephone Encounter (Signed)
Pt is in need of the invokana to be called into [facility] as well as the bromocriptine, janumet, and actos. Please. She was able to pick up the test strips but the other meds she was told by [facility] that they have no rx. Told pt of the confirmed receipt but please resend thank you

## 2016-11-18 NOTE — Telephone Encounter (Signed)
Notified patient that I sent in prescriptions.

## 2017-01-27 ENCOUNTER — Other Ambulatory Visit (INDEPENDENT_AMBULATORY_CARE_PROVIDER_SITE_OTHER): Payer: Commercial Managed Care - PPO

## 2017-01-27 ENCOUNTER — Encounter: Payer: Self-pay | Admitting: Internal Medicine

## 2017-01-27 ENCOUNTER — Ambulatory Visit (INDEPENDENT_AMBULATORY_CARE_PROVIDER_SITE_OTHER): Payer: Commercial Managed Care - PPO | Admitting: Internal Medicine

## 2017-01-27 VITALS — BP 108/64 | HR 81 | Temp 97.5°F | Ht 70.0 in | Wt 149.0 lb

## 2017-01-27 DIAGNOSIS — E1140 Type 2 diabetes mellitus with diabetic neuropathy, unspecified: Secondary | ICD-10-CM | POA: Diagnosis not present

## 2017-01-27 DIAGNOSIS — Z0000 Encounter for general adult medical examination without abnormal findings: Secondary | ICD-10-CM

## 2017-01-27 DIAGNOSIS — M5440 Lumbago with sciatica, unspecified side: Secondary | ICD-10-CM | POA: Diagnosis not present

## 2017-01-27 DIAGNOSIS — I10 Essential (primary) hypertension: Secondary | ICD-10-CM

## 2017-01-27 DIAGNOSIS — E538 Deficiency of other specified B group vitamins: Secondary | ICD-10-CM

## 2017-01-27 LAB — CBC WITH DIFFERENTIAL/PLATELET
BASOS PCT: 1 % (ref 0.0–3.0)
Basophils Absolute: 0 10*3/uL (ref 0.0–0.1)
EOS ABS: 0.1 10*3/uL (ref 0.0–0.7)
EOS PCT: 1.6 % (ref 0.0–5.0)
HEMATOCRIT: 43.2 % (ref 36.0–46.0)
HEMOGLOBIN: 14.1 g/dL (ref 12.0–15.0)
LYMPHS PCT: 39.9 % (ref 12.0–46.0)
Lymphs Abs: 1.9 10*3/uL (ref 0.7–4.0)
MCHC: 32.6 g/dL (ref 30.0–36.0)
MCV: 99.9 fl (ref 78.0–100.0)
MONO ABS: 0.4 10*3/uL (ref 0.1–1.0)
Monocytes Relative: 9.4 % (ref 3.0–12.0)
NEUTROS ABS: 2.3 10*3/uL (ref 1.4–7.7)
Neutrophils Relative %: 48.1 % (ref 43.0–77.0)
PLATELETS: 320 10*3/uL (ref 150.0–400.0)
RBC: 4.33 Mil/uL (ref 3.87–5.11)
RDW: 14.6 % (ref 11.5–15.5)
WBC: 4.7 10*3/uL (ref 4.0–10.5)

## 2017-01-27 LAB — MICROALBUMIN / CREATININE URINE RATIO
Creatinine,U: 97.5 mg/dL
MICROALB/CREAT RATIO: 0.7 mg/g (ref 0.0–30.0)

## 2017-01-27 LAB — HEPATIC FUNCTION PANEL
ALT: 20 U/L (ref 0–35)
AST: 20 U/L (ref 0–37)
Albumin: 4.4 g/dL (ref 3.5–5.2)
Alkaline Phosphatase: 84 U/L (ref 39–117)
BILIRUBIN TOTAL: 0.5 mg/dL (ref 0.2–1.2)
Bilirubin, Direct: 0.1 mg/dL (ref 0.0–0.3)
Total Protein: 7.8 g/dL (ref 6.0–8.3)

## 2017-01-27 LAB — URINALYSIS
Bilirubin Urine: NEGATIVE
Hgb urine dipstick: NEGATIVE
KETONES UR: NEGATIVE
Leukocytes, UA: NEGATIVE
Nitrite: NEGATIVE
SPECIFIC GRAVITY, URINE: 1.02 (ref 1.000–1.030)
TOTAL PROTEIN, URINE-UPE24: NEGATIVE
UROBILINOGEN UA: 0.2 (ref 0.0–1.0)
pH: 6.5 (ref 5.0–8.0)

## 2017-01-27 LAB — LIPID PANEL
CHOLESTEROL: 176 mg/dL (ref 0–200)
HDL: 51.4 mg/dL (ref >39.00)
LDL Cholesterol: 117 mg/dL — ABNORMAL HIGH (ref 0–99)
NONHDL: 124.96
TRIGLYCERIDES: 42 mg/dL (ref 0.0–149.0)
Total CHOL/HDL Ratio: 3
VLDL: 8.4 mg/dL (ref 0.0–40.0)

## 2017-01-27 LAB — BASIC METABOLIC PANEL
BUN: 13 mg/dL (ref 6–23)
CALCIUM: 10 mg/dL (ref 8.4–10.5)
CO2: 32 mEq/L (ref 19–32)
Chloride: 102 mEq/L (ref 96–112)
Creatinine, Ser: 0.73 mg/dL (ref 0.40–1.20)
GFR: 108.16 mL/min (ref >60.00)
GLUCOSE: 111 mg/dL — AB (ref 70–99)
POTASSIUM: 4.2 meq/L (ref 3.5–5.1)
SODIUM: 142 meq/L (ref 135–145)

## 2017-01-27 LAB — TSH: TSH: 1.79 u[IU]/mL (ref 0.35–4.50)

## 2017-01-27 LAB — HEMOGLOBIN A1C: HEMOGLOBIN A1C: 6.4 % (ref 4.6–6.5)

## 2017-01-27 MED ORDER — ZOSTER VAC RECOMB ADJUVANTED 50 MCG/0.5ML IM SUSR: 0.5000 mL | Freq: Once | INTRAMUSCULAR | 1 refills | Status: AC

## 2017-01-27 NOTE — Assessment & Plan Note (Addendum)
Invokana, Janumet, Actos, Parlodel Being seen q 6 mo between me and Dr Loanne Drilling

## 2017-01-27 NOTE — Assessment & Plan Note (Addendum)
We discussed age appropriate health related issues, including available/recomended screening tests and vaccinations. We discussed a need for adhering to healthy diet and exercise. Labs were ordered to be later reviewed . All questions were answered.  Shingrix vaccine Rx GYN and mammo q November Colon vs cologuard discussed

## 2017-01-27 NOTE — Assessment & Plan Note (Signed)
On B12 

## 2017-01-27 NOTE — Progress Notes (Signed)
Subjective:  Patient ID: Alexandra [name], female    DOB: [birth year]  Age: 51 y.o. MRN: [id number]  CC: No chief complaint on file.   HPI [name] presents for DM, HTN, insomnia f/u. Being seen q 6 mo between me and Dr Loanne Drilling  Outpatient Medications Prior to Visit  Medication Sig Dispense Refill  . albuterol (VENTOLIN HFA) 108 (90 BASE) MCG/ACT inhaler Inhale 1 puff into the lungs every 4 (four) hours as needed. 3 Inhaler 3  . bromocriptine (PARLODEL) 2.5 MG tablet Take 1 tablet (2.5 mg total) by mouth daily. 30 tablet 11  . canagliflozin (INVOKANA) 100 MG TABS tablet Take 1 tablet (100 mg total) by mouth daily. 90 tablet 1  . Cholecalciferol 1000 UNITS tablet Take 1 tablet (1,000 Units total) by mouth daily. 100 tablet 3  . cyclobenzaprine (FLEXERIL) 10 MG tablet Take 1 tablet (10 mg total) by mouth 2 (two) times daily as needed. 90 tablet 1  . fluticasone (FLONASE) 50 MCG/ACT nasal spray 2 sprays by Nasal route daily.      . fluticasone furoate-vilanterol (BREO ELLIPTA) 100-25 MCG/INH AEPB Inhale 1 puff into the lungs daily. 1 each 5  . Ginkgo Biloba 60 MG CAPS Take 2 capsules by mouth daily.    Marland Kitchen glucose blood (ONETOUCH VERIO) test strip And lancets 1/day 100 each 3  . hydrochlorothiazide (MICROZIDE) 12.5 MG capsule Take 1 capsule (12.5 mg total) by mouth every morning. 90 capsule 3  . ibuprofen (ADVIL,MOTRIN) 600 MG tablet Take 1 tablet (600 mg total) by mouth every 6 (six) hours as needed. 90 tablet 3  . levonorgestrel-ethinyl estradiol (AVIANE,ALESSE,LESSINA) 0.1-20 MG-MCG tablet Take 1 tablet by mouth as directed. 28 tablet 11  . loratadine (CLARITIN) 10 MG tablet Take 1 tablet (10 mg total) by mouth daily as needed for allergies. for allergies 100 tablet 3  . norethindrone (MICRONOR,CAMILA,ERRIN) 0.35 MG tablet Take 1 tablet (0.35 mg total) by mouth daily. 84 tablet 0  . pioglitazone (ACTOS) 45 MG tablet Take 1 tablet (45 mg total) by mouth daily. 90 tablet 0  . promethazine  (PHENERGAN) 25 MG tablet Take 1 tablet (25 mg total) by mouth 2 (two) times daily as needed for nausea. 60 tablet 2  . ranitidine (ZANTAC) 300 MG tablet Take 1 tablet (300 mg total) by mouth daily. 90 tablet 3  . sitaGLIPtin-metformin (JANUMET) 50-1000 MG tablet Take 1 tablet by mouth 2 (two) times daily with a meal. 180 tablet 0  . terbinafine (LAMISIL) 1 % cream Apply topically as needed.      . zolpidem (AMBIEN) 10 MG tablet Take 0.5-1 tablets (5-10 mg total) by mouth at bedtime as needed for sleep. 90 tablet 1   No facility-administered medications prior to visit.     ROS Review of Systems  Constitutional: Negative for activity change, appetite change, chills, fatigue and unexpected weight change.  HENT: Negative for congestion, mouth sores and sinus pressure.   Eyes: Negative for visual disturbance.  Respiratory: Negative for cough and chest tightness.   Gastrointestinal: Negative for abdominal pain and nausea.  Genitourinary: Negative for difficulty urinating, frequency and vaginal pain.  Musculoskeletal: Positive for arthralgias and back pain. Negative for gait problem.  Skin: Negative for pallor and rash.  Neurological: Negative for dizziness, tremors, weakness, numbness and headaches.  Psychiatric/Behavioral: Negative for confusion and sleep disturbance.    Objective:  BP 108/64 (BP Location: Left Arm, Patient Position: Sitting, Cuff Size: Normal)   Pulse 81   Temp Marland Kitchen)  97.5 F (36.4 C) (Oral)   Ht 5\' 10"  (1.778 m)   Wt 149 lb (67.6 kg)   SpO2 98%   BMI 21.38 kg/m   BP Readings from Last 3 Encounters:  01/27/17 108/64  11/15/16 110/72  10/27/16 112/78    Wt Readings from Last 3 Encounters:  01/27/17 149 lb (67.6 kg)  11/15/16 149 lb 3.2 oz (67.7 kg)  10/27/16 150 lb (68 kg)    Physical Exam  Constitutional: She appears well-developed. No distress.  HENT:  Head: Normocephalic.  Right Ear: External ear normal.  Left Ear: External ear normal.  Nose: Nose  normal.  Mouth/Throat: Oropharynx is clear and moist.  Eyes: Conjunctivae are normal. Pupils are equal, round, and reactive to light. Right eye exhibits no discharge. Left eye exhibits no discharge.  Neck: Normal range of motion. Neck supple. No JVD present. No tracheal deviation present. No thyromegaly present.  Cardiovascular: Normal rate, regular rhythm and normal heart sounds.  Pulmonary/Chest: No stridor. No respiratory distress. She has no wheezes.  Abdominal: Soft. Bowel sounds are normal. She exhibits no distension and no mass. There is no tenderness. There is no rebound and no guarding.  Musculoskeletal: She exhibits tenderness. She exhibits no edema.  Lymphadenopathy:    She has no cervical adenopathy.  Neurological: She displays normal reflexes. No cranial nerve deficit. She exhibits normal muscle tone. Coordination normal.  Skin: No rash noted. No erythema.  Psychiatric: She has a normal mood and affect. Her behavior is normal. Judgment and thought content normal.  LS tender w/ROM  Lab Results  Component Value Date   WBC 8.4 09/16/2011   HGB 12.1 09/16/2011   HCT 37.5 09/16/2011   PLT 311.0 09/16/2011   GLUCOSE 96 10/27/2016   CHOL 170 01/18/2016   TRIG 62.0 01/18/2016   HDL 55.70 01/18/2016   LDLDIRECT 122.0 03/03/2008   LDLCALC 102 (H) 01/18/2016   ALT 18 01/18/2016   AST 17 01/18/2016   NA 140 10/27/2016   K 4.2 10/27/2016   CL 101 10/27/2016   CREATININE 0.71 10/27/2016   BUN 10 10/27/2016   CO2 33 (H) 10/27/2016   TSH 1.99 01/18/2016   HGBA1C 5.6 11/15/2016   MICROALBUR <0.5 01/18/2016    Dg Foot Complete Left  Result Date: 10/07/2016 CLINICAL DATA:  Dropped heavy object on foot with pain and swelling EXAM: LEFT FOOT - COMPLETE 3+ VIEW COMPARISON:  None. FINDINGS: Frontal, oblique, and lateral views obtained. There is no fracture or dislocation. Joint spaces appear normal. No erosive change. There is a posterior calcaneal spur. IMPRESSION: Posterior  calcaneal spur. No fracture or dislocation. No appreciable joint space narrowing or erosion. Electronically Signed   By: Lowella Grip III M.D.   On: 10/07/2016 12:11    Assessment & Plan:   There are no diagnoses linked to this encounter. I am having Amado Nash. Emmons maintain her fluticasone, terbinafine, Cholecalciferol, albuterol, levonorgestrel-ethinyl estradiol, Ginkgo Biloba, zolpidem, ibuprofen, fluticasone furoate-vilanterol, cyclobenzaprine, hydrochlorothiazide, loratadine, norethindrone, promethazine, ranitidine, glucose blood, bromocriptine, canagliflozin, pioglitazone, and sitaGLIPtin-metformin.  No orders of the defined types were placed in this encounter.    Follow-up: No Follow-up on file.  Walker Kehr, MD

## 2017-01-27 NOTE — Assessment & Plan Note (Signed)
Doing well 

## 2017-01-27 NOTE — Assessment & Plan Note (Signed)
HCTZ, Invokana

## 2017-02-01 ENCOUNTER — Telehealth: Payer: Self-pay | Admitting: Internal Medicine

## 2017-02-01 NOTE — Telephone Encounter (Signed)
Pt given lab result from 11/9 and expressed understanding.

## 2017-03-19 ENCOUNTER — Other Ambulatory Visit: Payer: Self-pay | Admitting: Internal Medicine

## 2017-06-10 ENCOUNTER — Other Ambulatory Visit: Payer: Self-pay | Admitting: Internal Medicine

## 2017-06-11 ENCOUNTER — Other Ambulatory Visit: Payer: Self-pay | Admitting: Endocrinology

## 2017-07-15 ENCOUNTER — Other Ambulatory Visit: Payer: Self-pay | Admitting: Internal Medicine

## 2017-07-16 ENCOUNTER — Emergency Department (HOSPITAL_COMMUNITY)
Admission: EM | Admit: 2017-07-16 | Discharge: 2017-07-16 | Disposition: A | Discharge status: Home / Self Care | Payer: Commercial Managed Care - PPO | Attending: Emergency Medicine | Admitting: Emergency Medicine

## 2017-07-16 ENCOUNTER — Encounter (HOSPITAL_COMMUNITY): Payer: Self-pay | Admitting: Emergency Medicine

## 2017-07-16 DIAGNOSIS — Z79899 Other long term (current) drug therapy: Secondary | ICD-10-CM | POA: Diagnosis not present

## 2017-07-16 DIAGNOSIS — R197 Diarrhea, unspecified: Secondary | ICD-10-CM

## 2017-07-16 DIAGNOSIS — I10 Essential (primary) hypertension: Secondary | ICD-10-CM | POA: Insufficient documentation

## 2017-07-16 DIAGNOSIS — J45909 Unspecified asthma, uncomplicated: Secondary | ICD-10-CM | POA: Insufficient documentation

## 2017-07-16 DIAGNOSIS — Z7984 Long term (current) use of oral hypoglycemic drugs: Secondary | ICD-10-CM | POA: Diagnosis not present

## 2017-07-16 DIAGNOSIS — E119 Type 2 diabetes mellitus without complications: Secondary | ICD-10-CM | POA: Diagnosis not present

## 2017-07-16 LAB — I-STAT BETA HCG BLOOD, ED (MC, WL, AP ONLY)

## 2017-07-16 LAB — COMPREHENSIVE METABOLIC PANEL
ALBUMIN: 4 g/dL (ref 3.5–5.0)
ALK PHOS: 68 U/L (ref 38–126)
ALT: 23 U/L (ref 14–54)
AST: 30 U/L (ref 15–41)
Anion gap: 13 (ref 5–15)
BILIRUBIN TOTAL: 0.6 mg/dL (ref 0.3–1.2)
BUN: 14 mg/dL (ref 6–20)
CALCIUM: 9.1 mg/dL (ref 8.9–10.3)
CO2: 22 mmol/L (ref 22–32)
Chloride: 102 mmol/L (ref 101–111)
Creatinine, Ser: 0.61 mg/dL (ref 0.44–1.00)
GFR calc Af Amer: >60 mL/min (ref >60)
GFR calc non Af Amer: >60 mL/min (ref >60)
GLUCOSE: 90 mg/dL (ref 65–99)
Potassium: 3.4 mmol/L — ABNORMAL LOW (ref 3.5–5.1)
Sodium: 137 mmol/L (ref 135–145)
TOTAL PROTEIN: 8 g/dL (ref 6.5–8.1)

## 2017-07-16 LAB — CBC
HCT: 45.1 % (ref 36.0–46.0)
Hemoglobin: 14.7 g/dL (ref 12.0–15.0)
MCH: 32.5 pg (ref 26.0–34.0)
MCHC: 32.6 g/dL (ref 30.0–36.0)
MCV: 99.6 fL (ref 78.0–100.0)
Platelets: 290 10*3/uL (ref 150–400)
RBC: 4.53 MIL/uL (ref 3.87–5.11)
RDW: 14.2 % (ref 11.5–15.5)
WBC: 4.5 10*3/uL (ref 4.0–10.5)

## 2017-07-16 LAB — LIPASE, BLOOD: Lipase: 38 U/L (ref 11–51)

## 2017-07-16 MED ORDER — LOPERAMIDE HCL 2 MG PO CAPS: 2.0000 mg | ORAL_CAPSULE | Freq: Four times a day (QID) | ORAL | 0 refills | Status: DC | PRN

## 2017-07-16 NOTE — Discharge Instructions (Signed)
Follow up with your primary care doctor to discuss your hospital visit if symptoms do not resolve over the next few days. Continue to hydrate orally with small sips of fluids throughout the day.   The 'BRAT' diet is suggested, then progress to diet as tolerated as symptoms abate.  These foods can help thicken up your stool and help with diarrhea.  Bananas.  Rice.  Applesauce.  Toast (and other simple starches such as crackers, potatoes, noodles).   SEEK IMMEDIATE MEDICAL ATTENTION IF: You begin having localized abdominal pain that does not go away or becomes severe A temperature above 101 develops Repeated vomiting occurs (multiple uncontrollable episodes) or you are unable to keep fluids down Blood is being passed in stools or vomit (bright red or black tarry stools).  New or worsening symptoms develop, any additional concerns.

## 2017-07-16 NOTE — ED Provider Notes (Signed)
[facility] DEPT Provider Note   CSN: [id number] Arrival date & time: 07/16/17  3151     History   Chief Complaint Chief Complaint  Patient presents with  . Diarrhea    HPI Alexandra [name] is a 52 y.o. female.  The history is provided by the patient and medical records. No language interpreter was used.   Alexandra [name] is a 52 y.o. female  with a PMH of DM, HTN who presents to the Emergency Department complaining of nausea, vomiting and diarrhea.  Symptoms initially began 5 days ago.  She was having multiple episodes of emesis and dry heaving along with multiple loose, nonbloody stools.  Vomiting lasted about 24 hours and has now resolved.  Nausea is gone as well.  She is concerned because diarrhea has been persistent.  She notes every time she eats something, she will have a loose stool, typically about 3-4 times a day.  She has been able to tolerate fluids fine and believes she is staying well-hydrated.  No recent travel, camping, well water, etc.  No known sick contacts.  No fever or chills.  No back pain, chest pain, shortness of breath, dysuria, urinary urgency/frequency or vaginal discharge.   Past Medical History:  Diagnosis Date  . ALLERGIC RHINITIS   . Asthma   . CTS (carpal tunnel syndrome)   . Diabetes mellitus   . Glaucoma    ?  Marland Kitchen Hypertension   . Low back pain   . Osteoarthritis   . Polyarthralgia   . Vitamin B12 deficiency     Patient Active Problem List   Diagnosis Date Noted  . Left foot pain 10/07/2016  . Well adult exam 01/18/2016  . DM2 (diabetes mellitus, type 2) ([place]) 05/01/2014  . Leg cramps 12/27/2013  . Wound, open, foot 09/28/2012  . Cramps, extremity 08/27/2012  . Insomnia 05/21/2012  . CTS (carpal tunnel syndrome)   . Unspecified glaucoma(365.9)   . Polyarthralgia   . Vitamin B12 deficiency   . CHEST PAIN 05/28/2009  . LUMBAR RADICULOPATHY, RIGHT 09/25/2008  . URI 03/18/2008  . BRONCHITIS, ACUTE 10/05/2007    . HYPOKALEMIA 07/06/2007  . Essential hypertension 03/11/2007  . ALLERGIC RHINITIS 03/11/2007  . Asthma 03/11/2007  . OSTEOARTHRITIS 03/08/2007  . LOW BACK PAIN 03/08/2007    History reviewed. No pertinent surgical history.   OB History   None      Home Medications    Prior to Admission medications   Medication Sig Start Date End Date Taking? Authorizing Provider  albuterol (VENTOLIN HFA) 108 (90 BASE) MCG/ACT inhaler Inhale 1 puff into the lungs every 4 (four) hours as needed. 09/16/11   Plotnikov, Evie Lacks, MD  bromocriptine (PARLODEL) 2.5 MG tablet Take 1 tablet (2.5 mg total) by mouth daily. 11/18/16   Renato Shin, MD  canagliflozin (INVOKANA) 100 MG TABS tablet Take 1 tablet (100 mg total) by mouth daily. 11/18/16   Renato Shin, MD  Cholecalciferol 1000 UNITS tablet Take 1 tablet (1,000 Units total) by mouth daily. 05/13/11   Plotnikov, Evie Lacks, MD  cyclobenzaprine (FLEXERIL) 10 MG tablet Take 1 tablet (10 mg total) by mouth 2 (two) times daily as needed. 10/27/16   Plotnikov, Evie Lacks, MD  fluticasone (FLONASE) 50 MCG/ACT nasal spray 2 sprays by Nasal route daily.      [provider]  fluticasone furoate-vilanterol (BREO ELLIPTA) 100-25 MCG/INH AEPB Inhale 1 puff into the lungs daily. 10/27/16   Plotnikov, Evie Lacks, MD  Ginkgo  Biloba 60 MG CAPS Take 2 capsules by mouth daily.    [provider]  glucose blood (ONETOUCH VERIO) test strip And lancets 1/day 11/15/16   Renato Shin, MD  hydrochlorothiazide (MICROZIDE) 12.5 MG capsule Take 1 capsule (12.5 mg total) by mouth every morning. 10/27/16   Plotnikov, Evie Lacks, MD  ibuprofen (ADVIL,MOTRIN) 600 MG tablet Take 1 tablet (600 mg total) by mouth every 6 (six) hours as needed. 10/07/16   Biagio Borg, MD  JANUMET 50-1000 MG tablet TAKE 1 TABLET BY MOUTH TWICE DAILY WITH MEALS 06/12/17   Renato Shin, MD  JENCYCLA 0.35 MG tablet TAKE 1 TABLET BY MOUTH ONCE DAILY 06/12/17   Plotnikov, Evie Lacks, MD   levonorgestrel-ethinyl estradiol (AVIANE,ALESSE,LESSINA) 0.1-20 MG-MCG tablet Take 1 tablet by mouth as directed. 08/27/12   Plotnikov, Evie Lacks, MD  loperamide (IMODIUM) 2 MG capsule Take 1 capsule (2 mg total) by mouth 4 (four) times daily as needed for diarrhea or loose stools. 07/16/17   Ward, Ozella Almond, PA-C  loratadine (CLARITIN) 10 MG tablet Take 1 tablet (10 mg total) by mouth daily as needed for allergies. for allergies 10/27/16   Plotnikov, Evie Lacks, MD  pioglitazone (ACTOS) 45 MG tablet Take 1 tablet (45 mg total) by mouth daily. 11/18/16   Renato Shin, MD  promethazine (PHENERGAN) 25 MG tablet Take 1 tablet (25 mg total) by mouth 2 (two) times daily as needed for nausea. 10/27/16   Plotnikov, Evie Lacks, MD  ranitidine (ZANTAC) 300 MG tablet Take 1 tablet (300 mg total) by mouth daily. 10/27/16   Plotnikov, Evie Lacks, MD  terbinafine (LAMISIL) 1 % cream Apply topically as needed.      [provider]  zolpidem (AMBIEN) 10 MG tablet Take 0.5-1 tablets (5-10 mg total) by mouth at bedtime as needed for sleep. 09/03/14 10/03/14  Plotnikov, Evie Lacks, MD    Family History Family History  Problem Relation Age of Onset  . Diabetes Mother   . Kidney disease Mother 37       ESRD on HD  . Heart disease Mother   . Cancer Father        head and neck  . Hypertension Unknown   . Stroke Brother 65       CVAx2 on crack    Social History Social History   Tobacco Use  . Smoking status: Never Smoker  . Smokeless tobacco: Never Used  Substance Use Topics  . Alcohol use: No  . Drug use: No     Allergies   Benazepril hcl and Tramadol hcl   Review of Systems Review of Systems  Gastrointestinal: Positive for diarrhea, nausea (Resolved) and vomiting (Resolved).  All other systems reviewed and are negative.    Physical Exam Updated Vital Signs BP (!) 126/100 (BP Location: Left Arm)   Pulse 88   Temp 98.4 F (36.9 C) (Oral)   Resp 16   Ht 5\' 8"  (1.727 m)   Wt 64 kg (141  lb 2 oz)   SpO2 99%   BMI 21.46 kg/m   Physical Exam  Constitutional: She is oriented to person, place, and time. She appears well-developed and well-nourished. No distress.  Well appearing.   HENT:  Head: Normocephalic and atraumatic.  Neck: Neck supple.  Cardiovascular: Normal rate, regular rhythm and normal heart sounds.  No murmur heard. Pulmonary/Chest: Effort normal and breath sounds normal. No respiratory distress.  Abdominal: Soft. Bowel sounds are normal. She exhibits no distension.  No abdominal, flank or  CVA tenderness.  Neurological: She is alert and oriented to person, place, and time.  Skin: Skin is warm and dry.  Nursing note and vitals reviewed.    ED Treatments / Results  Labs (all labs ordered are listed, but only abnormal results are displayed) Labs Reviewed  COMPREHENSIVE METABOLIC PANEL - Abnormal; Notable for the following components:      Result Value   Potassium 3.4 (*)    All other components within normal limits  LIPASE, BLOOD  CBC  URINALYSIS, ROUTINE W REFLEX MICROSCOPIC  I-STAT BETA HCG BLOOD, ED ([facility], [facility], [facility] ONLY)  CBG MONITORING, ED    EKG None  Radiology No results found.  Procedures Procedures (including critical care time)  Medications Ordered in ED Medications - No data to display   Initial Impression / Assessment and Plan / ED Course  I have reviewed the triage vital signs and the nursing notes.  Pertinent labs & imaging results that were available during my care of the patient were reviewed by me and considered in my medical decision making (see chart for details).     Alexandra [name] is a 52 y.o. female who presents to ED for nausea, vomiting and non-bloody diarrhea. Nausea and vomiting x 24 hours which has now resolved, but diarrhea has been persistent for the last 4-5 days. On exam, patient is afebrile, non-toxic appearing with a benign abdominal exam. She appears very well. No signs of dehydration on exam. Labs reviewed  and reassuring. She is tolerating PO  with no episodes of emesis in ED. Sxs c/w viral etiology. Evaluation does not show pathology that would require ongoing emergent intervention or inpatient treatment. Rx for imodium given. PCP follow up encouraged if symptoms do not resolve in the next couple of days. Spoke at length with patient about signs or symptoms that should prompt return to emergency Department including inability to tolerate PO, blood in the stools, fevers, focal localization of abdominal pain, new/worsening symptoms or any additional concerns. Patient understands diagnosis and plan of care as dictated above. All questions answered.   Final Clinical Impressions(s) / ED Diagnoses   Final diagnoses:  Diarrhea in adult patient    ED Discharge Orders        Ordered    loperamide (IMODIUM) 2 MG capsule  4 times daily PRN     07/16/17 1418       Ward, Ozella Almond, PA-C 07/16/17 1426    Tanna Furry, MD 07/22/17 [phone]

## 2017-07-16 NOTE — ED Triage Notes (Signed)
Pt reports on Tuesday started having v/d since vomiting has stopped but still having diarrhea after everything she eats.

## 2017-07-26 ENCOUNTER — Ambulatory Visit: Payer: Commercial Managed Care - PPO | Admitting: Internal Medicine

## 2017-07-26 ENCOUNTER — Telehealth: Payer: Self-pay | Admitting: Obstetrics and Gynecology

## 2017-07-26 ENCOUNTER — Encounter: Payer: Self-pay | Admitting: Internal Medicine

## 2017-07-26 VITALS — BP 114/76 | HR 63 | Temp 98.2°F | Ht 68.0 in | Wt 147.0 lb

## 2017-07-26 DIAGNOSIS — Z780 Asymptomatic menopausal state: Secondary | ICD-10-CM | POA: Diagnosis not present

## 2017-07-26 DIAGNOSIS — I10 Essential (primary) hypertension: Secondary | ICD-10-CM | POA: Diagnosis not present

## 2017-07-26 DIAGNOSIS — E1140 Type 2 diabetes mellitus with diabetic neuropathy, unspecified: Secondary | ICD-10-CM | POA: Diagnosis not present

## 2017-07-26 DIAGNOSIS — G4700 Insomnia, unspecified: Secondary | ICD-10-CM

## 2017-07-26 MED ORDER — PIOGLITAZONE HCL 45 MG PO TABS: 45.0000 mg | ORAL_TABLET | Freq: Every day | ORAL | 3 refills | Status: DC

## 2017-07-26 MED ORDER — SITAGLIPTIN PHOS-METFORMIN HCL 50-1000 MG PO TABS: 1.0000 | ORAL_TABLET | Freq: Two times a day (BID) | ORAL | 3 refills | Status: DC

## 2017-07-26 MED ORDER — LORATADINE 10 MG PO TABS: 10.0000 mg | ORAL_TABLET | Freq: Every day | ORAL | 3 refills | Status: DC | PRN

## 2017-07-26 MED ORDER — HYDROCHLOROTHIAZIDE 12.5 MG PO CAPS: 12.5000 mg | ORAL_CAPSULE | ORAL | 3 refills | Status: DC

## 2017-07-26 MED ORDER — CANAGLIFLOZIN 100 MG PO TABS: 100.0000 mg | ORAL_TABLET | Freq: Every day | ORAL | 3 refills | Status: DC

## 2017-07-26 MED ORDER — RANITIDINE HCL 300 MG PO TABS: 300.0000 mg | ORAL_TABLET | Freq: Every day | ORAL | 3 refills | Status: DC

## 2017-07-26 MED ORDER — BROMOCRIPTINE MESYLATE 2.5 MG PO TABS: 2.5000 mg | ORAL_TABLET | Freq: Every day | ORAL | 3 refills | Status: DC

## 2017-07-26 NOTE — Progress Notes (Signed)
Subjective:  Patient ID: Alexandra [name], female    DOB: 05/03/1965  Age: 52 y.o. MRN: [id number]  CC: No chief complaint on file.   HPI Alexandra [name] presents for DM, HTN, allergies f/u  Outpatient Medications Prior to Visit  Medication Sig Dispense Refill  . albuterol (VENTOLIN HFA) 108 (90 BASE) MCG/ACT inhaler Inhale 1 puff into the lungs every 4 (four) hours as needed. 3 Inhaler 3  . bromocriptine (PARLODEL) 2.5 MG tablet Take 1 tablet (2.5 mg total) by mouth daily. 30 tablet 11  . canagliflozin (INVOKANA) 100 MG TABS tablet Take 1 tablet (100 mg total) by mouth daily. 90 tablet 1  . Cholecalciferol 1000 UNITS tablet Take 1 tablet (1,000 Units total) by mouth daily. 100 tablet 3  . cyclobenzaprine (FLEXERIL) 10 MG tablet Take 1 tablet (10 mg total) by mouth 2 (two) times daily as needed. 90 tablet 1  . fluticasone (FLONASE) 50 MCG/ACT nasal spray 2 sprays by Nasal route daily.      . fluticasone furoate-vilanterol (BREO ELLIPTA) 100-25 MCG/INH AEPB Inhale 1 puff into the lungs daily. 1 each 5  . Ginkgo Biloba 60 MG CAPS Take 2 capsules by mouth daily.    Marland Kitchen glucose blood (ONETOUCH VERIO) test strip And lancets 1/day 100 each 3  . hydrochlorothiazide (MICROZIDE) 12.5 MG capsule Take 1 capsule (12.5 mg total) by mouth every morning. 90 capsule 3  . ibuprofen (ADVIL,MOTRIN) 600 MG tablet TAKE 1 TABLET BY MOUTH EVERY 6 HOURS AS NEEDED 90 tablet 3  . JANUMET 50-1000 MG tablet TAKE 1 TABLET BY MOUTH TWICE DAILY WITH MEALS 180 tablet 0  . JENCYCLA 0.35 MG tablet TAKE 1 TABLET BY MOUTH ONCE DAILY 84 tablet 0  . levonorgestrel-ethinyl estradiol (AVIANE,ALESSE,LESSINA) 0.1-20 MG-MCG tablet Take 1 tablet by mouth as directed. 28 tablet 11  . loperamide (IMODIUM) 2 MG capsule Take 1 capsule (2 mg total) by mouth 4 (four) times daily as needed for diarrhea or loose stools. 12 capsule 0  . loratadine (CLARITIN) 10 MG tablet Take 1 tablet (10 mg total) by mouth daily as needed for allergies. for  allergies 100 tablet 3  . pioglitazone (ACTOS) 45 MG tablet Take 1 tablet (45 mg total) by mouth daily. 90 tablet 0  . promethazine (PHENERGAN) 25 MG tablet Take 1 tablet (25 mg total) by mouth 2 (two) times daily as needed for nausea. 60 tablet 2  . ranitidine (ZANTAC) 300 MG tablet Take 1 tablet (300 mg total) by mouth daily. 90 tablet 3  . terbinafine (LAMISIL) 1 % cream Apply topically as needed.      . zolpidem (AMBIEN) 10 MG tablet Take 0.5-1 tablets (5-10 mg total) by mouth at bedtime as needed for sleep. 90 tablet 1   No facility-administered medications prior to visit.     ROS Review of Systems  Constitutional: Negative for activity change, appetite change, chills, fatigue and unexpected weight change.  HENT: Negative for congestion, mouth sores and sinus pressure.   Eyes: Negative for visual disturbance.  Respiratory: Negative for cough and chest tightness.   Gastrointestinal: Negative for abdominal pain and nausea.  Genitourinary: Negative for difficulty urinating, frequency and vaginal pain.  Musculoskeletal: Negative for back pain and gait problem.  Skin: Negative for pallor and rash.  Neurological: Negative for dizziness, tremors, weakness, numbness and headaches.  Psychiatric/Behavioral: Negative for confusion and sleep disturbance.    Objective:  BP 114/76 (BP Location: Left Arm, Patient Position: Sitting, Cuff Size: Normal)   Pulse  63   Temp 98.2 F (36.8 C) (Oral)   Ht 5\' 8"  (1.727 m)   Wt 147 lb (66.7 kg)   SpO2 98%   BMI 22.35 kg/m   BP Readings from Last 3 Encounters:  07/26/17 114/76  07/16/17 (!) 126/100  01/27/17 108/64    Wt Readings from Last 3 Encounters:  07/26/17 147 lb (66.7 kg)  07/16/17 141 lb 2 oz (64 kg)  01/27/17 149 lb (67.6 kg)    Physical Exam  Constitutional: She appears well-developed. No distress.  HENT:  Head: Normocephalic.  Right Ear: External ear normal.  Left Ear: External ear normal.  Nose: Nose normal.    Mouth/Throat: Oropharynx is clear and moist.  Eyes: Pupils are equal, round, and reactive to light. Conjunctivae are normal. Right eye exhibits no discharge. Left eye exhibits no discharge.  Neck: Normal range of motion. Neck supple. No JVD present. No tracheal deviation present. No thyromegaly present.  Cardiovascular: Normal rate, regular rhythm and normal heart sounds.  Pulmonary/Chest: No stridor. No respiratory distress. She has no wheezes.  Abdominal: Soft. Bowel sounds are normal. She exhibits no distension and no mass. There is no tenderness. There is no rebound and no guarding.  Musculoskeletal: She exhibits no edema or tenderness.  Lymphadenopathy:    She has no cervical adenopathy.  Neurological: She displays normal reflexes. No cranial nerve deficit. She exhibits normal muscle tone. Coordination normal.  Skin: No rash noted. No erythema.  Psychiatric: She has a normal mood and affect. Her behavior is normal. Judgment and thought content normal.    Lab Results  Component Value Date   WBC 4.5 07/16/2017   HGB 14.7 07/16/2017   HCT 45.1 07/16/2017   PLT 290 07/16/2017   GLUCOSE 90 07/16/2017   CHOL 176 01/27/2017   TRIG 42.0 01/27/2017   HDL 51.40 01/27/2017   LDLDIRECT 122.0 03/03/2008   LDLCALC 117 (H) 01/27/2017   ALT 23 07/16/2017   AST 30 07/16/2017   NA 137 07/16/2017   K 3.4 (L) 07/16/2017   CL 102 07/16/2017   CREATININE 0.61 07/16/2017   BUN 14 07/16/2017   CO2 22 07/16/2017   TSH 1.79 01/27/2017   HGBA1C 6.4 01/27/2017   MICROALBUR <0.7 01/27/2017    No results found.  Assessment & Plan:   There are no diagnoses linked to this encounter. I am having Amado Nash. Aprea maintain her fluticasone, terbinafine, Cholecalciferol, albuterol, levonorgestrel-ethinyl estradiol, Ginkgo Biloba, zolpidem, fluticasone furoate-vilanterol, cyclobenzaprine, hydrochlorothiazide, loratadine, promethazine, ranitidine, glucose blood, bromocriptine, canagliflozin, pioglitazone,  JENCYCLA, JANUMET, ibuprofen, and loperamide.  No orders of the defined types were placed in this encounter.    Follow-up: No follow-ups on file.  Walker Kehr, MD

## 2017-07-26 NOTE — Assessment & Plan Note (Signed)
Doing well 

## 2017-07-26 NOTE — Assessment & Plan Note (Signed)
F/u Dr Hoover Browns, Janumet, Actos, Parlodel

## 2017-07-26 NOTE — Telephone Encounter (Signed)
Called and left a message for patient to call back to schedule an "urgent" new patient doctor referral appointment with our office to see Dr. Talbert Nan for: needs a PAP, menopausal.

## 2017-07-26 NOTE — Assessment & Plan Note (Signed)
HCTZ, Invokana

## 2017-07-27 NOTE — Telephone Encounter (Signed)
Called and left a message for patient to call back to schedule an "urgent" new patient doctor referral appointment with our office to see Dr. Talbert Nan for: needs a PAP, menopausal.

## 2017-07-28 NOTE — Telephone Encounter (Signed)
Patient returning call.

## 2017-07-31 NOTE — Telephone Encounter (Signed)
Called and left a message for patient to call back to schedule an "urgent"new patient doctor referral appointment with our office to see Dr. Talbert Nan LEX:NTZGY a PAP, menopausal.

## 2017-09-01 ENCOUNTER — Other Ambulatory Visit: Payer: Self-pay | Admitting: Internal Medicine

## 2017-09-19 NOTE — Progress Notes (Addendum)
52 y.o. No obstetric history on file. SingleAfrican AmericanF here for annual exam.  She is on the mini-pill (for the last few years), no periods. Some hot flashes and night sweats, using black cohosh, thinks it's helping. Tolerable.  Prior to OCP's she had bad cramps, never very heavy.  Not currently sexually active. Had some irritation with condoms.  H/O fibroid uterus, not very big.  Occasionally notices burning with urination, thinks it is external, not currently an issue.     No LMP recorded. (Menstrual status: Oral contraceptives).          Sexually active: Yes.    The current method of family planning is POP (estrogen/progesterone).    Exercising: No.  The patient does not participate in regular exercise at present. Smoker:  no  Health Maintenance: Pap:  10/29/2015 WNL per patient History of abnormal Pap:  no MMG:  11/18/2013 birads 1 negative Colonoscopy: never TDaP:  09/28/2012 Gardasil: N/A   reports that she has never smoked. She has never used smokeless tobacco. She reports that she does not drink alcohol or use drugs. 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 Works 16 hour days.   Past Medical History:  Diagnosis Date  . ALLERGIC RHINITIS   . Asthma   . CTS (carpal tunnel syndrome)   . Diabetes mellitus   . Fibroids    uterine  . Glaucoma    ?  Marland Kitchen Hypertension   . Low back pain   . Osteoarthritis   . Polyarthralgia   . Vitamin B12 deficiency     Past Surgical History:  Procedure Laterality Date  . BACK SURGERY     2011    Current Outpatient Medications  Medication Sig Dispense Refill  . albuterol (VENTOLIN HFA) 108 (90 BASE) MCG/ACT inhaler Inhale 1 puff into the lungs every 4 (four) hours as needed. 3 Inhaler 3  . bromocriptine (PARLODEL) 2.5 MG tablet Take 1 tablet (2.5 mg total) by mouth daily. 90 tablet 3  . canagliflozin (INVOKANA) 100 MG TABS tablet Take 1 tablet (100 mg total) by mouth daily. 90 tablet 3  . Cholecalciferol 1000 UNITS  tablet Take 1 tablet (1,000 Units total) by mouth daily. 100 tablet 3  . cyclobenzaprine (FLEXERIL) 10 MG tablet Take 1 tablet (10 mg total) by mouth 2 (two) times daily as needed. 90 tablet 1  . fluticasone (FLONASE) 50 MCG/ACT nasal spray 2 sprays by Nasal route daily.      . fluticasone furoate-vilanterol (BREO ELLIPTA) 100-25 MCG/INH AEPB Inhale 1 puff into the lungs daily. 1 each 5  . Ginkgo Biloba 60 MG CAPS Take 2 capsules by mouth daily.    Marland Kitchen glucose blood (ONETOUCH VERIO) test strip And lancets 1/day 100 each 3  . hydrochlorothiazide (MICROZIDE) 12.5 MG capsule Take 1 capsule (12.5 mg total) by mouth every morning. 90 capsule 3  . ibuprofen (ADVIL,MOTRIN) 600 MG tablet TAKE 1 TABLET BY MOUTH EVERY 6 HOURS AS NEEDED 90 tablet 3  . JENCYCLA 0.35 MG tablet TAKE 1 TABLET BY MOUTH ONCE DAILY 90 tablet 1  . loperamide (IMODIUM) 2 MG capsule Take 1 capsule (2 mg total) by mouth 4 (four) times daily as needed for diarrhea or loose stools. 12 capsule 0  . loratadine (CLARITIN) 10 MG tablet Take 1 tablet (10 mg total) by mouth daily as needed for allergies. for allergies 100 tablet 3  . pioglitazone (ACTOS) 45 MG tablet Take 1 tablet (45 mg total) by mouth daily. [place]  tablet 3  . promethazine (PHENERGAN) 25 MG tablet Take 1 tablet (25 mg total) by mouth 2 (two) times daily as needed for nausea. 60 tablet 2  . ranitidine (ZANTAC) 300 MG tablet Take 1 tablet (300 mg total) by mouth daily. 90 tablet 3  . sitaGLIPtin-metformin (JANUMET) 50-1000 MG tablet Take 1 tablet by mouth 2 (two) times daily with a meal. 180 tablet 3  . zolpidem (AMBIEN) 10 MG tablet Take 0.5-1 tablets (5-10 mg total) by mouth at bedtime as needed for sleep. 90 tablet 1   No current facility-administered medications for this visit.   On Parlodel for diabetes  Family History  Problem Relation Age of Onset  . Diabetes Mother   . Kidney disease Mother 55       ESRD on HD  . Heart disease Mother   . Cancer Father        head and  neck  . Hypertension Unknown   . Stroke Brother 52       CVAx2 on crack  Mother had colon polyps  Review of Systems  Constitutional: Negative.   HENT: Negative.   Eyes: Negative.   Respiratory: Negative.   Cardiovascular: Negative.   Gastrointestinal: Negative.   Endocrine: Negative.   Genitourinary: Negative.   Musculoskeletal: Negative.   Skin: Negative.   Allergic/Immunologic: Negative.   Neurological: Negative.   Hematological: Negative.   Psychiatric/Behavioral: Negative.     Exam:   BP 118/82 (BP Location: Right Arm, Patient Position: Sitting)   Pulse 68   Ht 5\' 8"  (1.727 m)   Wt 144 lb 3.2 oz (65.4 kg)   BMI 21.93 kg/m   Weight change: @WEIGHTCHANGE @ Height:   Height: 5\' 8"  (172.7 cm)  Ht Readings from Last 3 Encounters:  09/20/17 5\' 8"  (1.727 m)  07/26/17 5\' 8"  (1.727 m)  07/16/17 5\' 8"  (1.727 m)    General appearance: alert, cooperative and appears stated age Head: Normocephalic, without obvious abnormality, atraumatic Neck: no adenopathy, supple, symmetrical, trachea midline and thyroid normal to inspection and palpation Lungs: clear to auscultation bilaterally Cardiovascular: regular rate and rhythm Breasts: normal appearance, no masses or tenderness Abdomen: soft, non-tender; non distended,  no masses,  no organomegaly Extremities: extremities normal, atraumatic, no cyanosis or edema Skin: Skin color, texture, turgor normal. No rashes or lesions Lymph nodes: Cervical, supraclavicular, and axillary nodes normal. No abnormal inguinal nodes palpated Neurologic: Grossly normal   Pelvic: External genitalia:  no lesions              Urethra:  normal appearing urethra with no masses, tenderness or lesions               Bartholins and Skenes: normal                 Vagina: atrophic appearing vagina with normal color and discharge, no lesions              Cervix: no lesions               Bimanual Exam:  Uterus:  uterus is enlarged 10-12 week sized, deviated  to the patient's right, decreased mobility, not tender              Adnexa: no mass, fullness, tenderness               Rectovaginal: Confirms               Anus:  normal sphincter tone, no lesions  Chaperone was present  for exam.  A:  Well Woman with normal exam  Fibroid uterus  On POP, suspect she is menopausal  Atrophic vagina  Vasomotor symptoms, currently tolerable  P:   Pap with hpv, GC/CT/trich  STD testing   FSH, if elevated will stop POP, give cyclic provera 2 months after stopping if no spontaneous cycle  Get a copy of her last ultrasound to determine if the fibroid has changed in size  Call with symptoms of vaginal atrophy  Referral placed for colonoscopy   Addendum: Ultrasound from 09/30/13 reviewed Uterus 5.65 x 7.13 x 8.68, Endometrium 0.68 4 fibroids noted: 2.4 cm, 3.4 cm, 4.1 cm and 8.2 cm This seems consistent with her exam.

## 2017-09-20 ENCOUNTER — Other Ambulatory Visit (HOSPITAL_COMMUNITY)
Admission: RE | Admit: 2017-09-20 | Discharge: 2017-09-20 | Disposition: A | Discharge status: Home / Self Care | Payer: Commercial Managed Care - PPO | Source: Ambulatory Visit | Attending: Obstetrics and Gynecology | Admitting: Obstetrics and Gynecology

## 2017-09-20 ENCOUNTER — Encounter: Payer: Self-pay | Admitting: Obstetrics and Gynecology

## 2017-09-20 ENCOUNTER — Other Ambulatory Visit: Payer: Self-pay

## 2017-09-20 ENCOUNTER — Other Ambulatory Visit: Payer: Self-pay | Admitting: Obstetrics and Gynecology

## 2017-09-20 ENCOUNTER — Ambulatory Visit (INDEPENDENT_AMBULATORY_CARE_PROVIDER_SITE_OTHER): Payer: Commercial Managed Care - PPO | Admitting: Obstetrics and Gynecology

## 2017-09-20 ENCOUNTER — Encounter: Payer: Self-pay | Admitting: Internal Medicine

## 2017-09-20 VITALS — BP 118/82 | HR 68 | Ht 68.0 in | Wt 144.2 lb

## 2017-09-20 DIAGNOSIS — Z113 Encounter for screening for infections with a predominantly sexual mode of transmission: Secondary | ICD-10-CM | POA: Diagnosis not present

## 2017-09-20 DIAGNOSIS — Z01419 Encounter for gynecological examination (general) (routine) without abnormal findings: Secondary | ICD-10-CM | POA: Diagnosis not present

## 2017-09-20 DIAGNOSIS — N912 Amenorrhea, unspecified: Secondary | ICD-10-CM | POA: Diagnosis not present

## 2017-09-20 DIAGNOSIS — Z1211 Encounter for screening for malignant neoplasm of colon: Secondary | ICD-10-CM

## 2017-09-20 DIAGNOSIS — N951 Menopausal and female climacteric states: Secondary | ICD-10-CM

## 2017-09-20 DIAGNOSIS — Z0000 Encounter for general adult medical examination without abnormal findings: Secondary | ICD-10-CM | POA: Diagnosis not present

## 2017-09-20 DIAGNOSIS — Z124 Encounter for screening for malignant neoplasm of cervix: Secondary | ICD-10-CM | POA: Insufficient documentation

## 2017-09-20 DIAGNOSIS — Z1231 Encounter for screening mammogram for malignant neoplasm of breast: Secondary | ICD-10-CM

## 2017-09-20 DIAGNOSIS — N952 Postmenopausal atrophic vaginitis: Secondary | ICD-10-CM

## 2017-09-20 DIAGNOSIS — D259 Leiomyoma of uterus, unspecified: Secondary | ICD-10-CM

## 2017-09-20 NOTE — Patient Instructions (Signed)
EXERCISE AND DIET:  We recommended that you start or continue a regular exercise program for good health. Regular exercise means any activity that makes your heart beat faster and makes you sweat.  We recommend exercising at least 30 minutes per day at least 3 days a week, preferably 4 or 5.  We also recommend a diet low in fat and sugar.  Inactivity, poor dietary choices and obesity can cause diabetes, heart attack, stroke, and kidney damage, among others.    ALCOHOL AND SMOKING:  Women should limit their alcohol intake to no more than 7 drinks/beers/glasses of wine (combined, not each!) per week. Moderation of alcohol intake to this level decreases your risk of breast cancer and liver damage. And of course, no recreational drugs are part of a healthy lifestyle.  And absolutely no smoking or even second hand smoke. Most people know smoking can cause heart and lung diseases, but did you know it also contributes to weakening of your bones? Aging of your skin?  Yellowing of your teeth and nails?  CALCIUM AND VITAMIN D:  Adequate intake of calcium and Vitamin D are recommended.  The recommendations for exact amounts of these supplements seem to change often, but generally speaking 600 mg of calcium (either carbonate or citrate) and 800 units of Vitamin D per day seems prudent. Certain women may benefit from higher intake of Vitamin D.  If you are among these women, your doctor will have told you during your visit.    PAP SMEARS:  Pap smears, to check for cervical cancer or precancers,  have traditionally been done yearly, although recent scientific advances have shown that most women can have pap smears less often.  However, every woman still should have a physical exam from her gynecologist every year. It will include a breast check, inspection of the vulva and vagina to check for abnormal growths or skin changes, a visual exam of the cervix, and then an exam to evaluate the size and shape of the uterus and  ovaries.  And after 51 years of age, a rectal exam is indicated to check for rectal cancers. We will also provide age appropriate advice regarding health maintenance, like when you should have certain vaccines, screening for sexually transmitted diseases, bone density testing, colonoscopy, mammograms, etc.   MAMMOGRAMS:  All women over 40 years old should have a yearly mammogram. Many facilities now offer a "3D" mammogram, which may cost around $50 extra out of pocket. If possible,  we recommend you accept the option to have the 3D mammogram performed.  It both reduces the number of women who will be called back for extra views which then turn out to be normal, and it is better than the routine mammogram at detecting truly abnormal areas.    COLONOSCOPY:  Colonoscopy to screen for colon cancer is recommended for all women at age 50.  We know, you hate the idea of the prep.  We agree, BUT, having colon cancer and not knowing it is worse!!  Colon cancer so often starts as a polyp that can be seen and removed at colonscopy, which can quite literally save your life!  And if your first colonoscopy is normal and you have no family history of colon cancer, most women don't have to have it again for 10 years.  Once every ten years, you can do something that may end up saving your life, right?  We will be happy to help you get it scheduled when you are ready.    Be sure to check your insurance coverage so you understand how much it will cost.  It may be covered as a preventative service at no cost, but you should check your particular policy.      Breast Self-Awareness Breast self-awareness means being familiar with how your breasts look and feel. It involves checking your breasts regularly and reporting any changes to your health care provider. Practicing breast self-awareness is important. A change in your breasts can be a sign of a serious medical problem. Being familiar with how your breasts look and feel allows  you to find any problems early, when treatment is more likely to be successful. All women should practice breast self-awareness, including women who have had breast implants. How to do a breast self-exam One way to learn what is normal for your breasts and whether your breasts are changing is to do a breast self-exam. To do a breast self-exam: Look for Changes  1. Remove all the clothing above your waist. 2. Stand in front of a mirror in a room with good lighting. 3. Put your hands on your hips. 4. Push your hands firmly downward. 5. Compare your breasts in the mirror. Look for differences between them (asymmetry), such as: ? Differences in shape. ? Differences in size. ? Puckers, dips, and bumps in one breast and not the other. 6. Look at each breast for changes in your skin, such as: ? Redness. ? Scaly areas. 7. Look for changes in your nipples, such as: ? Discharge. ? Bleeding. ? Dimpling. ? Redness. ? A change in position. Feel for Changes  Carefully feel your breasts for lumps and changes. It is best to do this while lying on your back on the floor and again while sitting or standing in the shower or tub with soapy water on your skin. Feel each breast in the following way:  Place the arm on the side of the breast you are examining above your head.  Feel your breast with the other hand.  Start in the nipple area and make  inch (2 cm) overlapping circles to feel your breast. Use the pads of your three middle fingers to do this. Apply light pressure, then medium pressure, then firm pressure. The light pressure will allow you to feel the tissue closest to the skin. The medium pressure will allow you to feel the tissue that is a little deeper. The firm pressure will allow you to feel the tissue close to the ribs.  Continue the overlapping circles, moving downward over the breast until you feel your ribs below your breast.  Move one finger-width toward the center of the body.  Continue to use the  inch (2 cm) overlapping circles to feel your breast as you move slowly up toward your collarbone.  Continue the up and down exam using all three pressures until you reach your armpit.  Write Down What You Find  Write down what is normal for each breast and any changes that you find. Keep a written record with breast changes or normal findings for each breast. By writing this information down, you do not need to depend only on memory for size, tenderness, or location. Write down where you are in your menstrual cycle, if you are still menstruating. If you are having trouble noticing differences in your breasts, do not get discouraged. With time you will become more familiar with the variations in your breasts and more comfortable with the exam. How often should I examine my breasts? Examine   your breasts every month. If you are breastfeeding, the best time to examine your breasts is after a feeding or after using a breast pump. If you menstruate, the best time to examine your breasts is 5-7 days after your period is over. During your period, your breasts are lumpier, and it may be more difficult to notice changes. When should I see my health care provider? See your health care provider if you notice:  A change in shape or size of your breasts or nipples.  A change in the skin of your breast or nipples, such as a reddened or scaly area.  Unusual discharge from your nipples.  A lump or thick area that was not there before.  Pain in your breasts.  Anything that concerns you.  This information is not intended to replace advice given to you by your health care provider. Make sure you discuss any questions you have with your health care provider. Document Released: 03/07/2005 Document Revised: 08/13/2015 Document Reviewed: 01/25/2015 Elsevier Interactive Patient Education  2018 [employer]  

## 2017-09-22 LAB — HEP, RPR, HIV PANEL
HIV SCREEN 4TH GENERATION: NONREACTIVE
Hepatitis B Surface Ag: NEGATIVE
RPR: NONREACTIVE

## 2017-09-22 LAB — HEPATITIS C ANTIBODY: Hep C Virus Ab: <0.1 s/co ratio (ref 0.0–0.9)

## 2017-09-22 LAB — FOLLICLE STIMULATING HORMONE: FSH: 83.3 m[IU]/mL

## 2017-09-25 LAB — CYTOLOGY - PAP
CHLAMYDIA, DNA PROBE: NEGATIVE
HPV: DETECTED — AB
Neisseria Gonorrhea: NEGATIVE
TRICH (WINDOWPATH): NEGATIVE

## 2017-09-26 ENCOUNTER — Telehealth: Payer: Self-pay

## 2017-09-26 DIAGNOSIS — R87810 Cervical high risk human papillomavirus (HPV) DNA test positive: Secondary | ICD-10-CM

## 2017-09-26 DIAGNOSIS — R87611 Atypical squamous cells cannot exclude high grade squamous intraepithelial lesion on cytologic smear of cervix (ASC-H): Secondary | ICD-10-CM

## 2017-09-26 NOTE — Telephone Encounter (Signed)
Left message to call Wanette at [phone].  Please see if Sharee Pimple in triage is available if I am not available when the patient returns call. Patient needs a colpo.

## 2017-09-26 NOTE — Telephone Encounter (Signed)
Patient returning Alexandra [name]'s call. Will be at work until 7 pm, she will call back in the morning.

## 2017-09-26 NOTE — Telephone Encounter (Signed)
-----   Message from Salvadore Dom, MD sent at 09/25/2017  5:12 PM EDT ----- ASC-H pap with +HPV, needs colposcopy. Please inform and set up. The rest of her testing was negative.  Her [place] was in a menopausal range. I think she should stop the mini-pill, she is either menopausal or peri-menopausal. She should call if she gets a cycle, if no cycle in 2 months, please have her take provera 5 mg x 5 days. If she is getting any endometrial buildup she should bleed from that.

## 2017-09-27 NOTE — Telephone Encounter (Signed)
Spoke with patient. Results given. Patient verbalizes understanding. Patient is no longer sexually active. Was last sexually active 1 month ago. Colposcopy scheduled for 10/12/2017 at 9 am with Dr.Jertson. Patient verbalizes understanding.  Instructions given. Motrin 800 mg po x , one hour before appointment with food. Make sure to eat a meal before appointment and drink plenty of fluids. Patient verbalized understanding and will call to reschedule if will be on menses or has any concerns regarding pregnancy. Aware she will need to remain abstinent until after her colposcopy. Patient will stop taking her Mini pill and will call if she gets a menses or if she does not have a menses for 2 months.  Routing to provider for final review. Patient agreeable to disposition. Will close encounter.

## 2017-09-27 NOTE — Telephone Encounter (Signed)
Patient returned call to [facility]. Triage nurse was not available to take the call.

## 2017-09-28 ENCOUNTER — Telehealth: Payer: Self-pay | Admitting: Obstetrics and Gynecology

## 2017-09-28 NOTE — Telephone Encounter (Signed)
Call placed to convey benefits. 

## 2017-10-02 ENCOUNTER — Telehealth: Payer: Self-pay

## 2017-10-02 NOTE — Telephone Encounter (Signed)
Patient returning call to Kaitlyn. °

## 2017-10-02 NOTE — Telephone Encounter (Signed)
Left message to call Alson Mcpheeters at [phone]. 

## 2017-10-02 NOTE — Telephone Encounter (Signed)
-----   Message from Salvadore Dom, MD sent at 09/30/2017  4:30 PM EDT ----- Please let the patient know that I got a copy of her last ultrasound report in 2015. Her exam is c/w the ultrasound. No further evaluation is needed at this time.  Thanks, Sharee Pimple

## 2017-10-02 NOTE — Telephone Encounter (Signed)
Spoke with patient. Patient verbalizes understanding. Encounter closed.

## 2017-10-10 NOTE — Progress Notes (Signed)
GYNECOLOGY  VISIT   HPI: 52 y.o.   Single  African American  female   [id number] with No LMP. Stopped OCP 2 weeks ago. No cycle with OCP and since stopping.  Patient is here for colposcopy due to ASC-H pap with positive HPV.  GYNECOLOGIC HISTORY: Contraception: Abstinence  Menopausal hormone therapy: None        OB History    Gravida  0   Para  0   Term  0   Preterm  0   AB  0   Living  0     SAB  0   TAB  0   Ectopic  0   Multiple  0   Live Births  0              Patient Active Problem List   Diagnosis Date Noted  . Left foot pain 10/07/2016  . Well adult exam 01/18/2016  . DM2 (diabetes mellitus, type 2) ([place]) 05/01/2014  . Leg cramps 12/27/2013  . Wound, open, foot 09/28/2012  . Cramps, extremity 08/27/2012  . Insomnia 05/21/2012  . CTS (carpal tunnel syndrome)   . Unspecified glaucoma(365.9)   . Polyarthralgia   . Vitamin B12 deficiency   . CHEST PAIN 05/28/2009  . LUMBAR RADICULOPATHY, RIGHT 09/25/2008  . URI 03/18/2008  . BRONCHITIS, ACUTE 10/05/2007  . HYPOKALEMIA 07/06/2007  . Essential hypertension 03/11/2007  . ALLERGIC RHINITIS 03/11/2007  . Asthma 03/11/2007  . OSTEOARTHRITIS 03/08/2007  . LOW BACK PAIN 03/08/2007    Past Medical History:  Diagnosis Date  . ALLERGIC RHINITIS   . Asthma   . CTS (carpal tunnel syndrome)   . Diabetes mellitus   . Fibroids    uterine  . Glaucoma    ?  Marland Kitchen Hypertension   . Low back pain   . Osteoarthritis   . Polyarthralgia   . Vitamin B12 deficiency     Past Surgical History:  Procedure Laterality Date  . BACK SURGERY     2011    Current Outpatient Medications  Medication Sig Dispense Refill  . albuterol (VENTOLIN HFA) 108 (90 BASE) MCG/ACT inhaler Inhale 1 puff into the lungs every 4 (four) hours as needed. 3 Inhaler 3  . bromocriptine (PARLODEL) 2.5 MG tablet Take 1 tablet (2.5 mg total) by mouth daily. 90 tablet 3  . canagliflozin (INVOKANA) 100 MG TABS tablet Take 1 tablet (100 mg  total) by mouth daily. 90 tablet 3  . Cholecalciferol 1000 UNITS tablet Take 1 tablet (1,000 Units total) by mouth daily. 100 tablet 3  . cyclobenzaprine (FLEXERIL) 10 MG tablet Take 1 tablet (10 mg total) by mouth 2 (two) times daily as needed. 90 tablet 1  . fluticasone (FLONASE) 50 MCG/ACT nasal spray 2 sprays by Nasal route daily.      . fluticasone furoate-vilanterol (BREO ELLIPTA) 100-25 MCG/INH AEPB Inhale 1 puff into the lungs daily. 1 each 5  . Ginkgo Biloba 60 MG CAPS Take 2 capsules by mouth daily.    Marland Kitchen glucose blood (ONETOUCH VERIO) test strip And lancets 1/day 100 each 3  . hydrochlorothiazide (MICROZIDE) 12.5 MG capsule Take 1 capsule (12.5 mg total) by mouth every morning. 90 capsule 3  . ibuprofen (ADVIL,MOTRIN) 600 MG tablet TAKE 1 TABLET BY MOUTH EVERY 6 HOURS AS NEEDED 90 tablet 3  . JENCYCLA 0.35 MG tablet TAKE 1 TABLET BY MOUTH ONCE DAILY 90 tablet 1  . loperamide (IMODIUM) 2 MG capsule Take 1 capsule (2 mg total) by mouth  4 (four) times daily as needed for diarrhea or loose stools. 12 capsule 0  . loratadine (CLARITIN) 10 MG tablet Take 1 tablet (10 mg total) by mouth daily as needed for allergies. for allergies 100 tablet 3  . pioglitazone (ACTOS) 45 MG tablet Take 1 tablet (45 mg total) by mouth daily. 90 tablet 3  . promethazine (PHENERGAN) 25 MG tablet Take 1 tablet (25 mg total) by mouth 2 (two) times daily as needed for nausea. 60 tablet 2  . ranitidine (ZANTAC) 300 MG tablet Take 1 tablet (300 mg total) by mouth daily. 90 tablet 3  . sitaGLIPtin-metformin (JANUMET) 50-1000 MG tablet Take 1 tablet by mouth 2 (two) times daily with a meal. 180 tablet 3  . zolpidem (AMBIEN) 10 MG tablet Take 0.5-1 tablets (5-10 mg total) by mouth at bedtime as needed for sleep. 90 tablet 1   No current facility-administered medications for this visit.      ALLERGIES: Benazepril hcl and Tramadol hcl  Family History  Problem Relation Age of Onset  . Diabetes Mother   . Kidney disease  Mother 62       ESRD on HD  . Heart disease Mother   . Cancer Father        head and neck  . Hypertension Unknown   . Stroke Brother 57       CVAx2 on crack    Social History   Socioeconomic History  . Marital status: Single    Spouse name: Not on file  . Number of children: Not on file  . Years of education: Not on file  . Highest education level: Not on file  Occupational History  . Occupation: WORKS SECOND SHIFT    Employer: [employer]    Comment: Public house manager  Social Needs  . Financial resource strain: Not on file  . Food insecurity:    Worry: Not on file    Inability: Not on file  . Transportation needs:    Medical: Not on file    Non-medical: Not on file  Tobacco Use  . Smoking status: Never Smoker  . Smokeless tobacco: Never Used  Substance and Sexual Activity  . Alcohol use: No  . Drug use: No  . Sexual activity: Not Currently    Birth control/protection: Pill  Lifestyle  . Physical activity:    Days per week: Not on file    Minutes per session: Not on file  . Stress: Not on file  Relationships  . Social connections:    Talks on phone: Not on file    Gets together: Not on file    Attends religious service: Not on file    Active member of club or organization: Not on file    Attends meetings of clubs or organizations: Not on file    Relationship status: Not on file  . Intimate partner violence:    Fear of current or ex partner: Not on file    Emotionally abused: Not on file    Physically abused: Not on file    Forced sexual activity: Not on file  Other Topics Concern  . Not on file  Social History Narrative  . Not on file    Review of Systems  Constitutional: Negative.   HENT: Negative.   Eyes: Negative.   Respiratory: Negative.   Cardiovascular: Negative.   Gastrointestinal: Negative.   Genitourinary: Negative.   Musculoskeletal: Negative.   Skin: Negative.   Neurological: Negative.   Endo/Heme/Allergies: Negative.  Psychiatric/Behavioral: Negative.     PHYSICAL EXAMINATION:    There were no vitals taken for this visit.    General appearance: alert, cooperative and appears stated age  Pelvic: External genitalia:  no lesions              Urethra:  normal appearing urethra with no masses, tenderness or lesions              Bartholins and Skenes: normal                 Vagina: atrophic appearing vagina with a slight increase in watery, white vaginal d/c. Vaginal walls with mild erythema              Cervix: no lesions               Colposcopy: Unsatisfactory, no aceto-white changes. ECC done. Few patches of decreased lugols uptake in the vagina. Vaginal biopsies at 3 and 10 o'clock. Biopsy sites treated with silver nitrate.   Chaperone was present for exam.  ASSESSMENT ASC-H pap, +HPV Vaginal discharge Stopped the mini-pill 2 weeks ago, elevated FSH, suspect she is menopausal    PLAN Colposcopy, Vaginal biopsies and ECC done Affirm sent Script for provera sent (she should start it 9/1/1/9 if no spontaneous cycle), call with or without a bleed   An After Visit Summary was printed and given to the patient.

## 2017-10-11 ENCOUNTER — Ambulatory Visit (INDEPENDENT_AMBULATORY_CARE_PROVIDER_SITE_OTHER): Payer: Commercial Managed Care - PPO | Admitting: Endocrinology

## 2017-10-11 ENCOUNTER — Ambulatory Visit: Payer: Commercial Managed Care - PPO

## 2017-10-11 ENCOUNTER — Encounter: Payer: Self-pay | Admitting: Endocrinology

## 2017-10-11 ENCOUNTER — Ambulatory Visit
Admission: RE | Admit: 2017-10-11 | Discharge: 2017-10-11 | Disposition: A | Discharge status: Home / Self Care | Payer: Commercial Managed Care - PPO | Source: Ambulatory Visit | Attending: Obstetrics and Gynecology | Admitting: Obstetrics and Gynecology

## 2017-10-11 VITALS — BP 106/78 | HR 85 | Wt 144.8 lb

## 2017-10-11 DIAGNOSIS — E1140 Type 2 diabetes mellitus with diabetic neuropathy, unspecified: Secondary | ICD-10-CM

## 2017-10-11 DIAGNOSIS — Z1231 Encounter for screening mammogram for malignant neoplasm of breast: Secondary | ICD-10-CM

## 2017-10-11 LAB — POCT GLYCOSYLATED HEMOGLOBIN (HGB A1C): Hemoglobin A1C: 5.7 % — AB (ref 4.0–5.6)

## 2017-10-11 MED ORDER — METFORMIN HCL ER 500 MG PO TB24: 2000.0000 mg | ORAL_TABLET | Freq: Every day | ORAL | 3 refills | Status: DC

## 2017-10-11 MED ORDER — REPAGLINIDE 0.5 MG PO TABS: 0.5000 mg | ORAL_TABLET | Freq: Three times a day (TID) | ORAL | 3 refills | Status: DC

## 2017-10-11 NOTE — Patient Instructions (Addendum)
Please continue 4 diabetes medications as listed below. check your blood sugar once a day.  vary the time of day when you check, between before the 3 meals, and at bedtime.  also check if you have symptoms of your blood sugar being too high or too low.  please keep a record of the readings and bring it to your next appointment here (or you can bring the meter itself).  You can write it on any piece of paper.  please call us sooner if your blood sugar goes below 70, or if you have a lot of readings over 200.  Please come back for a follow-up appointment in 3-4 months.

## 2017-10-11 NOTE — Progress Notes (Signed)
Subjective:    Patient ID: Alexandra [name], female    DOB: 1966-02-05, 52 y.o.   MRN: [id number]  HPI Pt returns for f/u of diabetes mellitus: DM type: 2 (she is presumed to be evolving type 1, based on lean body habitus, and mother's h/o type 1) Dx'ed: 2595 Complications: none Therapy: 5 oral meds.  GDM: never DKA: never Severe hypoglycemia: never Pancreatitis: never Other: she has never been on insulin, but she has learned how.  Interval history: she does not check cbg's.  pt states she feels well in general. She has a high-deductible plan, on which she cannot afford brand-name meds.   Past Medical History:  Diagnosis Date  . ALLERGIC RHINITIS   . Asthma   . CTS (carpal tunnel syndrome)   . Diabetes mellitus   . Fibroids    uterine  . Glaucoma    ?  Marland Kitchen Hypertension   . Low back pain   . Osteoarthritis   . Polyarthralgia   . Vitamin B12 deficiency     Past Surgical History:  Procedure Laterality Date  . BACK SURGERY     2011    Social History   Socioeconomic History  . Marital status: Single    Spouse name: Not on file  . Number of children: Not on file  . Years of education: Not on file  . Highest education level: Not on file  Occupational History  . Occupation: WORKS SECOND SHIFT    Employer: [employer]    Comment: Public house manager  Social Needs  . Financial resource strain: Not on file  . Food insecurity:    Worry: Not on file    Inability: Not on file  . Transportation needs:    Medical: Not on file    Non-medical: Not on file  Tobacco Use  . Smoking status: Never Smoker  . Smokeless tobacco: Never Used  Substance and Sexual Activity  . Alcohol use: No  . Drug use: No  . Sexual activity: Not Currently    Birth control/protection: None  Lifestyle  . Physical activity:    Days per week: Not on file    Minutes per session: Not on file  . Stress: Not on file  Relationships  . Social connections:    Talks on phone: Not on file    Gets  together: Not on file    Attends religious service: Not on file    Active member of club or organization: Not on file    Attends meetings of clubs or organizations: Not on file    Relationship status: Not on file  . Intimate partner violence:    Fear of current or ex partner: Not on file    Emotionally abused: Not on file    Physically abused: Not on file    Forced sexual activity: Not on file  Other Topics Concern  . Not on file  Social History Narrative  . Not on file    Current Outpatient Medications on File Prior to Visit  Medication Sig Dispense Refill  . albuterol (VENTOLIN HFA) 108 (90 BASE) MCG/ACT inhaler Inhale 1 puff into the lungs every 4 (four) hours as needed. 3 Inhaler 3  . bromocriptine (PARLODEL) 2.5 MG tablet Take 1 tablet (2.5 mg total) by mouth daily. 90 tablet 3  . Cholecalciferol 1000 UNITS tablet Take 1 tablet (1,000 Units total) by mouth daily. 100 tablet 3  . cyclobenzaprine (FLEXERIL) 10 MG tablet Take 1 tablet (10 mg total) by mouth  2 (two) times daily as needed. 90 tablet 1  . fluticasone (FLONASE) 50 MCG/ACT nasal spray 2 sprays by Nasal route daily.      . fluticasone furoate-vilanterol (BREO ELLIPTA) 100-25 MCG/INH AEPB Inhale 1 puff into the lungs daily. 1 each 5  . Ginkgo Biloba 60 MG CAPS Take 2 capsules by mouth daily.    Marland Kitchen glucose blood (ONETOUCH VERIO) test strip And lancets 1/day 100 each 3  . hydrochlorothiazide (MICROZIDE) 12.5 MG capsule Take 1 capsule (12.5 mg total) by mouth every morning. 90 capsule 3  . ibuprofen (ADVIL,MOTRIN) 600 MG tablet TAKE 1 TABLET BY MOUTH EVERY 6 HOURS AS NEEDED 90 tablet 3  . loperamide (IMODIUM) 2 MG capsule Take 1 capsule (2 mg total) by mouth 4 (four) times daily as needed for diarrhea or loose stools. 12 capsule 0  . loratadine (CLARITIN) 10 MG tablet Take 1 tablet (10 mg total) by mouth daily as needed for allergies. for allergies 100 tablet 3  . pioglitazone (ACTOS) 45 MG tablet Take 1 tablet (45 mg total) by  mouth daily. 90 tablet 3  . promethazine (PHENERGAN) 25 MG tablet Take 1 tablet (25 mg total) by mouth 2 (two) times daily as needed for nausea. 60 tablet 2  . ranitidine (ZANTAC) 300 MG tablet Take 1 tablet (300 mg total) by mouth daily. 90 tablet 3  . zolpidem (AMBIEN) 10 MG tablet Take 0.5-1 tablets (5-10 mg total) by mouth at bedtime as needed for sleep. 90 tablet 1   No current facility-administered medications on file prior to visit.     Allergies  Allergen Reactions  . Benazepril Hcl     REACTION: cough  . Tramadol Hcl     REACTION: itching    Family History  Problem Relation Age of Onset  . Diabetes Mother   . Kidney disease Mother 3       ESRD on HD  . Heart disease Mother   . Cancer Father        head and neck  . Hypertension Unknown   . Stroke Brother 45       CVAx2 on crack  . Breast cancer Neg Hx     BP 106/78 (BP Location: Right Arm, Patient Position: Sitting, Cuff Size: Normal)   Pulse 85   Wt 144 lb 12.8 oz (65.7 kg)   SpO2 99%   BMI 22.02 kg/m    Review of Systems She denies hypoglycemia    Objective:   Physical Exam VITAL SIGNS:  See vs page GENERAL: no distress Pulses: dorsalis pedis intact bilat.   MSK: no deformity of the feet CV: no leg edema Skin:  no ulcer on the feet.  normal color and temp on the feet. Neuro: sensation is intact to touch on the feet    Lab Results  Component Value Date   HGBA1C 5.7 (A) 10/11/2017      Assessment & Plan:  Insulin-requiring type 2 DM: overcontrolled  Patient Instructions  Please continue 4 diabetes medications as listed below. check your blood sugar once a day.  vary the time of day when you check, between before the 3 meals, and at bedtime.  also check if you have symptoms of your blood sugar being too high or too low.  please keep a record of the readings and bring it to your next appointment here (or you can bring the meter itself).  You can write it on any piece of paper.  please call us  sooner if  your blood sugar goes below 70, or if you have a lot of readings over 200.  Please come back for a follow-up appointment in 3-4 months.

## 2017-10-12 ENCOUNTER — Ambulatory Visit (INDEPENDENT_AMBULATORY_CARE_PROVIDER_SITE_OTHER): Payer: Commercial Managed Care - PPO | Admitting: Obstetrics and Gynecology

## 2017-10-12 ENCOUNTER — Other Ambulatory Visit: Payer: Self-pay

## 2017-10-12 ENCOUNTER — Encounter: Payer: Self-pay | Admitting: Obstetrics and Gynecology

## 2017-10-12 VITALS — BP 106/72 | HR 80 | Wt 142.6 lb

## 2017-10-12 DIAGNOSIS — N898 Other specified noninflammatory disorders of vagina: Secondary | ICD-10-CM

## 2017-10-12 DIAGNOSIS — R87611 Atypical squamous cells cannot exclude high grade squamous intraepithelial lesion on cytologic smear of cervix (ASC-H): Secondary | ICD-10-CM

## 2017-10-12 DIAGNOSIS — R87810 Cervical high risk human papillomavirus (HPV) DNA test positive: Secondary | ICD-10-CM | POA: Diagnosis not present

## 2017-10-12 DIAGNOSIS — Z01812 Encounter for preprocedural laboratory examination: Secondary | ICD-10-CM

## 2017-10-12 DIAGNOSIS — N912 Amenorrhea, unspecified: Secondary | ICD-10-CM

## 2017-10-12 MED ORDER — MEDROXYPROGESTERONE ACETATE 5 MG PO TABS: ORAL_TABLET | ORAL | 2 refills | Status: DC

## 2017-10-12 NOTE — Patient Instructions (Signed)

## 2017-10-13 ENCOUNTER — Telehealth: Payer: Self-pay | Admitting: *Deleted

## 2017-10-13 LAB — VAGINITIS/VAGINOSIS, DNA PROBE
Candida Species: NEGATIVE
GARDNERELLA VAGINALIS: POSITIVE — AB
Trichomonas vaginosis: NEGATIVE

## 2017-10-13 NOTE — Telephone Encounter (Signed)
Notes recorded by Burnice Logan, RN on 10/13/2017 at 11:54 AM EDT Left message to call Sharee Pimple at [phone].

## 2017-10-13 NOTE — Telephone Encounter (Signed)
-----   Message from Salvadore Dom, MD sent at 10/13/2017 11:20 AM EDT ----- Please inform the patient that her vaginitis probe was + for BV and treat with flagyl (either oral or vaginal, her choice), no ETOH while on Flagyl.  Oral: Flagyl 500 mg BID x 7 days, or Vaginal: Metrogel, 1 applicator per vagina q day x 5 days. Biopsies are pending

## 2017-10-16 MED ORDER — METRONIDAZOLE 500 MG PO TABS: 500.0000 mg | ORAL_TABLET | Freq: Two times a day (BID) | ORAL | 0 refills | Status: DC

## 2017-10-16 NOTE — Telephone Encounter (Signed)
-----   Message from Salvadore Dom, MD sent at 10/13/2017  5:47 PM EDT ----- Please inform the patient that her ECC was negative, both of her vaginal biopsies returned with VAIN I. Recommendation is close f/u. She should have a pap with hpv in 6 months. Please schedule.

## 2017-10-16 NOTE — Telephone Encounter (Signed)
Spoke with patient, advised of both results as seen below per Dr. Talbert Nan. Rx for Flagyl to verified pharmacy. ETOH precautions reviewed. OV scheduled for 04/16/18 at 1:45PM with Dr. Talbert Nan. Patient verbalizes understanding and is agreeable. Encounter closed.   06 recall placed.

## 2017-11-15 ENCOUNTER — Ambulatory Visit (AMBULATORY_SURGERY_CENTER): Payer: Self-pay | Admitting: *Deleted

## 2017-11-15 ENCOUNTER — Ambulatory Visit: Payer: Commercial Managed Care - PPO | Admitting: Endocrinology

## 2017-11-15 VITALS — Ht 68.0 in | Wt 151.8 lb

## 2017-11-15 DIAGNOSIS — Z1211 Encounter for screening for malignant neoplasm of colon: Secondary | ICD-10-CM

## 2017-11-15 NOTE — Progress Notes (Signed)
No egg or soy allergy  No intubation problems or anesthesia problems per pt  No home oxygen used or hx of sleep apnea  No diet medications taken

## 2017-11-21 ENCOUNTER — Encounter: Payer: Self-pay | Admitting: Internal Medicine

## 2017-11-29 ENCOUNTER — Ambulatory Visit (AMBULATORY_SURGERY_CENTER): Payer: Commercial Managed Care - PPO | Admitting: Internal Medicine

## 2017-11-29 ENCOUNTER — Encounter: Payer: Self-pay | Admitting: Internal Medicine

## 2017-11-29 VITALS — BP 96/58 | HR 78 | Temp 97.1°F | Resp 20 | Ht 68.0 in | Wt 151.0 lb

## 2017-11-29 DIAGNOSIS — D123 Benign neoplasm of transverse colon: Secondary | ICD-10-CM

## 2017-11-29 DIAGNOSIS — E119 Type 2 diabetes mellitus without complications: Secondary | ICD-10-CM | POA: Diagnosis not present

## 2017-11-29 DIAGNOSIS — D120 Benign neoplasm of cecum: Secondary | ICD-10-CM

## 2017-11-29 DIAGNOSIS — Z1211 Encounter for screening for malignant neoplasm of colon: Secondary | ICD-10-CM

## 2017-11-29 DIAGNOSIS — J45909 Unspecified asthma, uncomplicated: Secondary | ICD-10-CM | POA: Diagnosis not present

## 2017-11-29 MED ORDER — SODIUM CHLORIDE 0.9 % IV SOLN
500.0000 mL | Freq: Once | INTRAVENOUS | Status: DC
Start: 2017-11-29 — End: 2017-11-29

## 2017-11-29 NOTE — Progress Notes (Signed)
Report to PACU, RN, vss, BBS= Clear.  

## 2017-11-29 NOTE — Progress Notes (Signed)
Pt's states no medical or surgical changes since previsit or office visit. 

## 2017-11-29 NOTE — Patient Instructions (Addendum)
I found and removed 2 polyps. Look benign (NOT cancer)  I will let you know pathology results and when to have another routine colonoscopy by mail and/or My Chart.  I appreciate the opportunity to care for you. Gatha Mayer, MD, [facility]   Discharge instructions given. Handout on polyps. Resume previous medications. YOU HAD AN ENDOSCOPIC PROCEDURE TODAY AT [facility]:   Refer to the procedure report that was given to you for any specific questions about what was found during the examination.  If the procedure report does not answer your questions, please call your gastroenterologist to clarify.  If you requested that your care partner not be given the details of your procedure findings, then the procedure report has been included in a sealed envelope for you to review at your convenience later.  YOU SHOULD EXPECT: Some feelings of bloating in the abdomen. Passage of more gas than usual.  Walking can help get rid of the air that was put into your GI tract during the procedure and reduce the bloating. If you had a lower endoscopy (such as a colonoscopy or flexible sigmoidoscopy) you may notice spotting of blood in your stool or on the toilet paper. If you underwent a bowel prep for your procedure, you may not have a normal bowel movement for a few days.  Please Note:  You might notice some irritation and congestion in your nose or some drainage.  This is from the oxygen used during your procedure.  There is no need for concern and it should clear up in a day or so.  SYMPTOMS TO REPORT IMMEDIATELY:   Following lower endoscopy (colonoscopy or flexible sigmoidoscopy):  Excessive amounts of blood in the stool  Significant tenderness or worsening of abdominal pains  Swelling of the abdomen that is new, acute  Fever of 100F or higher   For urgent or emergent issues, a gastroenterologist can be reached at any hour by calling [phone].   DIET:  We do recommend a  small meal at first, but then you may proceed to your regular diet.  Drink plenty of fluids but you should avoid alcoholic beverages for 24 hours.  ACTIVITY:  You should plan to take it easy for the rest of today and you should NOT DRIVE or use heavy machinery until tomorrow (because of the sedation medicines used during the test).    FOLLOW UP: Our staff will call the number listed on your records the next business day following your procedure to check on you and address any questions or concerns that you may have regarding the information given to you following your procedure. If we do not reach you, we will leave a message.  However, if you are feeling well and you are not experiencing any problems, there is no need to return our call.  We will assume that you have returned to your regular daily activities without incident.  If any biopsies were taken you will be contacted by phone or by letter within the next 1-3 weeks.  Please call us at [phone] if you have not heard about the biopsies in 3 weeks.    SIGNATURES/CONFIDENTIALITY: You and/or your care partner have signed paperwork which will be entered into your electronic medical record.  These signatures attest to the fact that that the information above on your After Visit Summary has been reviewed and is understood.  Full responsibility of the confidentiality of this discharge information lies with you and/or your care-partner.

## 2017-11-29 NOTE — Op Note (Signed)
[facility] Patient Name: Alexandra [name] Procedure Date: 11/29/2017 9:15 AM MRN: [id number] Endoscopist: Gatha Mayer , MD Age: 52 Referring MD:  Date of Birth: [birth year] Gender: Female Account #: [id number] Procedure:                Colonoscopy Indications:              Screening for colorectal malignant neoplasm, This                            is the patient's first colonoscopy Medicines:                Propofol per Anesthesia, Monitored Anesthesia Care Procedure:                Pre-Anesthesia Assessment:                           - Prior to the procedure, a History and Physical                            was performed, and patient medications and                            allergies were reviewed. The patient's tolerance of                            previous anesthesia was also reviewed. The risks                            and benefits of the procedure and the sedation                            options and risks were discussed with the patient.                            All questions were answered, and informed consent                            was obtained. Prior Anticoagulants: The patient has                            taken no previous anticoagulant or antiplatelet                            agents. ASA Grade Assessment: II - A patient with                            mild systemic disease. After reviewing the risks                            and benefits, the patient was deemed in                            satisfactory condition to undergo the procedure.  After obtaining informed consent, the colonoscope                            was passed under direct vision. Throughout the                            procedure, the patient's blood pressure, pulse, and                            oxygen saturations were monitored continuously. The                            Colonoscope was introduced through the anus and   advanced to the the cecum, identified by                            appendiceal orifice and ileocecal valve. The                            colonoscopy was performed without difficulty. The                            patient tolerated the procedure well. The quality                            of the bowel preparation was adequate. The                            ileocecal valve, appendiceal orifice, and rectum                            were photographed. The bowel preparation used was                            Miralax. Scope In: 9:19:03 AM Scope Out: 9:43:41 AM Scope Withdrawal Time: 0 hours 17 minutes 21 seconds  Total Procedure Duration: 0 hours 24 minutes 38 seconds  Findings:                 The perianal and digital rectal examinations were                            normal.                           Two flat and sessile polyps were found in the                            transverse colon and cecum. The polyps were 8 to 10                            mm in size. These polyps were removed with a cold                            snare. Resection and retrieval were  complete.                            Verification of patient identification for the                            specimen was done. Estimated blood loss was minimal.                           The exam was otherwise without abnormality on                            direct and retroflexion views. Complications:            No immediate complications. Estimated Blood Loss:     Estimated blood loss was minimal. Impression:               - Two 8 to 10 mm polyps in the transverse colon and                            in the cecum, removed with a cold snare. Resected                            and retrieved.                           - The examination was otherwise normal on direct                            and retroflexion views. Recommendation:           - Patient has a contact number available for                             emergencies. The signs and symptoms of potential                            delayed complications were discussed with the                            patient. Return to normal activities tomorrow.                            Written discharge instructions were provided to the                            patient.                           - Resume previous diet.                           - Continue present medications.                           - Repeat colonoscopy is recommended. The  colonoscopy date will be determined after pathology                            results from today's exam become available for                            review. Gatha Mayer, MD 11/29/2017 9:53:14 AM This report has been signed electronically.

## 2017-11-29 NOTE — Progress Notes (Signed)
Called to room to assist during endoscopic procedure.  Patient ID and intended procedure confirmed with present staff. Received instructions for my participation in the procedure from the performing physician.  

## 2017-11-30 ENCOUNTER — Telehealth: Payer: Self-pay | Admitting: *Deleted

## 2017-11-30 ENCOUNTER — Telehealth: Payer: Self-pay

## 2017-11-30 NOTE — Telephone Encounter (Signed)
  Follow up Call-  Call back number 11/29/2017  Post procedure Call Back phone  # [phone]  Permission to leave phone message Yes  Some recent data might be hidden    No ID on answering machine Angela/Call-back

## 2017-11-30 NOTE — Telephone Encounter (Signed)
Follow up call #2 . Voicemail with number identifier.  Message left if any questions or concerns.

## 2017-12-15 ENCOUNTER — Encounter: Payer: Self-pay | Admitting: Internal Medicine

## 2017-12-15 DIAGNOSIS — Z860101 Personal history of adenomatous and serrated colon polyps: Secondary | ICD-10-CM

## 2017-12-15 DIAGNOSIS — Z86010 Personal history of colonic polyps: Secondary | ICD-10-CM

## 2017-12-15 HISTORY — DX: Personal history of adenomatous and serrated colon polyps: Z86.0101

## 2017-12-15 HISTORY — DX: Personal history of colonic polyps: Z86.010

## 2017-12-15 NOTE — Progress Notes (Signed)
2 adenomas max 10 mm Recall 2022

## 2018-01-24 ENCOUNTER — Encounter: Payer: Self-pay | Admitting: Endocrinology

## 2018-01-24 ENCOUNTER — Ambulatory Visit (INDEPENDENT_AMBULATORY_CARE_PROVIDER_SITE_OTHER): Payer: Commercial Managed Care - PPO | Admitting: Endocrinology

## 2018-01-24 VITALS — BP 112/64 | HR 71 | Ht 68.0 in | Wt 162.0 lb

## 2018-01-24 DIAGNOSIS — E1140 Type 2 diabetes mellitus with diabetic neuropathy, unspecified: Secondary | ICD-10-CM | POA: Diagnosis not present

## 2018-01-24 LAB — POCT GLYCOSYLATED HEMOGLOBIN (HGB A1C): Hemoglobin A1C: 5.3 % (ref 4.0–5.6)

## 2018-01-24 NOTE — Patient Instructions (Addendum)
Please stop taking the repaglinide, and: Please continue the same other diabetes medications. check your blood sugar once a day.  vary the time of day when you check, between before the 3 meals, and at bedtime.  also check if you have symptoms of your blood sugar being too high or too low.  please keep a record of the readings and bring it to your next appointment here (or you can bring the meter itself).  You can write it on any piece of paper.  please call us sooner if your blood sugar goes below 70, or if you have a lot of readings over 200.    Please come back for a follow-up appointment in 3 months.  

## 2018-01-24 NOTE — Progress Notes (Signed)
Subjective:    Patient ID: Alexandra [name], female    DOB: 03-19-1966, 52 y.o.   MRN: [id number]  HPI Pt returns for f/u of diabetes mellitus: DM type: 2 (she is presumed to be evolving type 1, based on lean body habitus, and mother's h/o type 1) Dx'ed: 1517 Complications: none Therapy: 4 oral meds.  GDM: never DKA: never Severe hypoglycemia: never Pancreatitis: never Other: she has never been on insulin, but she has learned how; She has a high-deductible plan, on which she cannot afford brand-name meds.  Interval history: she brings a record of her cbg's which I have reviewed today.  It varies from 70-158.  pt states she feels well in general.  Past Medical History:  Diagnosis Date  . ALLERGIC RHINITIS   . Allergy   . Asthma   . CTS (carpal tunnel syndrome)   . Diabetes mellitus   . Fibroids    uterine  . Glaucoma    ?  Marland Kitchen Hx of adenomatous colonic polyps 12/15/2017  . Hypertension   . Low back pain   . Osteoarthritis   . Polyarthralgia   . Vitamin B12 deficiency     Past Surgical History:  Procedure Laterality Date  . BACK SURGERY     2011  . cervical bx      Social History   Socioeconomic History  . Marital status: Single    Spouse name: Not on file  . Number of children: Not on file  . Years of education: Not on file  . Highest education level: Not on file  Occupational History  . Occupation: WORKS SECOND SHIFT    Employer: [employer]    Comment: Public house manager  Social Needs  . Financial resource strain: Not on file  . Food insecurity:    Worry: Not on file    Inability: Not on file  . Transportation needs:    Medical: Not on file    Non-medical: Not on file  Tobacco Use  . Smoking status: Never Smoker  . Smokeless tobacco: Never Used  Substance and Sexual Activity  . Alcohol use: Yes    Comment: occasional wine  . Drug use: No  . Sexual activity: Not Currently    Birth control/protection: None    Comment: not sexually active at this  time 11-15-17  Lifestyle  . Physical activity:    Days per week: Not on file    Minutes per session: Not on file  . Stress: Not on file  Relationships  . Social connections:    Talks on phone: Not on file    Gets together: Not on file    Attends religious service: Not on file    Active member of club or organization: Not on file    Attends meetings of clubs or organizations: Not on file    Relationship status: Not on file  . Intimate partner violence:    Fear of current or ex partner: Not on file    Emotionally abused: Not on file    Physically abused: Not on file    Forced sexual activity: Not on file  Other Topics Concern  . Not on file  Social History Narrative  . Not on file    Current Outpatient Medications on File Prior to Visit  Medication Sig Dispense Refill  . albuterol (VENTOLIN HFA) 108 (90 BASE) MCG/ACT inhaler Inhale 1 puff into the lungs every 4 (four) hours as needed. 3 Inhaler 3  . bromocriptine (PARLODEL) 2.5 MG  tablet Take 1 tablet (2.5 mg total) by mouth daily. 90 tablet 3  . Cholecalciferol 1000 UNITS tablet Take 1 tablet (1,000 Units total) by mouth daily. 100 tablet 3  . cyclobenzaprine (FLEXERIL) 10 MG tablet Take 1 tablet (10 mg total) by mouth 2 (two) times daily as needed. 90 tablet 1  . fluticasone (FLONASE) 50 MCG/ACT nasal spray 2 sprays by Nasal route daily.      . fluticasone furoate-vilanterol (BREO ELLIPTA) 100-25 MCG/INH AEPB Inhale 1 puff into the lungs daily. 1 each 5  . Ginkgo Biloba 60 MG CAPS Take 2 capsules by mouth daily.    Marland Kitchen glucose blood (ONETOUCH VERIO) test strip And lancets 1/day 100 each 3  . hydrochlorothiazide (MICROZIDE) 12.5 MG capsule Take 1 capsule (12.5 mg total) by mouth every morning. 90 capsule 3  . ibuprofen (ADVIL,MOTRIN) 600 MG tablet TAKE 1 TABLET BY MOUTH EVERY 6 HOURS AS NEEDED 90 tablet 3  . loperamide (IMODIUM) 2 MG capsule Take 1 capsule (2 mg total) by mouth 4 (four) times daily as needed for diarrhea or loose  stools. 12 capsule 0  . loratadine (CLARITIN) 10 MG tablet Take 1 tablet (10 mg total) by mouth daily as needed for allergies. for allergies 100 tablet 3  . medroxyPROGESTERone (PROVERA) 5 MG tablet Take one tablet a day for 5 days if no period by 11/19/17 5 tablet 2  . metFORMIN (GLUCOPHAGE-XR) 500 MG 24 hr tablet Take 4 tablets (2,000 mg total) by mouth daily with breakfast. 360 tablet 3  . pioglitazone (ACTOS) 45 MG tablet Take 1 tablet (45 mg total) by mouth daily. 90 tablet 3  . promethazine (PHENERGAN) 25 MG tablet Take 1 tablet (25 mg total) by mouth 2 (two) times daily as needed for nausea. 60 tablet 2  . ranitidine (ZANTAC) 300 MG tablet Take 1 tablet (300 mg total) by mouth daily. 90 tablet 3  . zolpidem (AMBIEN) 10 MG tablet Take 0.5-1 tablets (5-10 mg total) by mouth at bedtime as needed for sleep. 90 tablet 1   No current facility-administered medications on file prior to visit.     Allergies  Allergen Reactions  . Adhesive [Tape] Other (See Comments)    EKG lead adhesive caused skin break down  . Benazepril Hcl     REACTION: cough  . Tramadol Hcl     REACTION: itching    Family History  Problem Relation Age of Onset  . Diabetes Mother   . Kidney disease Mother 3       ESRD on HD  . Heart disease Mother   . Colon polyps Mother   . Cancer Father        head and neck  . Hypertension Unknown   . Stroke Brother 45       CVAx2 on crack  . Breast cancer Neg Hx   . Colitis Neg Hx   . Esophageal cancer Neg Hx   . Rectal cancer Neg Hx   . Stomach cancer Neg Hx     BP 112/64 (BP Location: Left Arm, Patient Position: Sitting, Cuff Size: Normal)   Pulse 71   Ht 5\' 8"  (1.727 m)   Wt 162 lb (73.5 kg)   SpO2 98%   BMI 24.63 kg/m    Review of Systems She denies hypoglycemia.      Objective:   Physical Exam VITAL SIGNS:  See vs page GENERAL: no distress Pulses: dorsalis pedis intact bilat.   MSK: no deformity of the feet CV:  no leg edema Skin:  no ulcer on the  feet.  normal color and temp on the feet. Neuro: sensation is intact to touch on the feet  A1c=5.3%  Lab Results  Component Value Date   CREATININE 0.61 07/16/2017   BUN 14 07/16/2017   NA 137 07/16/2017   K 3.4 (L) 07/16/2017   CL 102 07/16/2017   CO2 22 07/16/2017        Assessment & Plan:  Type 2 DM: overcontrolled.   Patient Instructions  Please stop taking the repaglinide, and:  Please continue the same other diabetes medications.   check your blood sugar once a day.  vary the time of day when you check, between before the 3 meals, and at bedtime.  also check if you have symptoms of your blood sugar being too high or too low.  please keep a record of the readings and bring it to your next appointment here (or you can bring the meter itself).  You can write it on any piece of paper.  please call us sooner if your blood sugar goes below 70, or if you have a lot of readings over 200.  Please come back for a follow-up appointment in 3 months.

## 2018-01-29 ENCOUNTER — Encounter: Payer: Self-pay | Admitting: Internal Medicine

## 2018-01-29 ENCOUNTER — Ambulatory Visit (INDEPENDENT_AMBULATORY_CARE_PROVIDER_SITE_OTHER): Payer: Commercial Managed Care - PPO | Admitting: Internal Medicine

## 2018-01-29 DIAGNOSIS — J4520 Mild intermittent asthma, uncomplicated: Secondary | ICD-10-CM | POA: Diagnosis not present

## 2018-01-29 DIAGNOSIS — E1140 Type 2 diabetes mellitus with diabetic neuropathy, unspecified: Secondary | ICD-10-CM

## 2018-01-29 DIAGNOSIS — I10 Essential (primary) hypertension: Secondary | ICD-10-CM | POA: Diagnosis not present

## 2018-01-29 DIAGNOSIS — E538 Deficiency of other specified B group vitamins: Secondary | ICD-10-CM

## 2018-01-29 NOTE — Assessment & Plan Note (Addendum)
Actos, Parlodel CT ca scoring info given

## 2018-01-29 NOTE — Patient Instructions (Signed)

## 2018-01-29 NOTE — Assessment & Plan Note (Signed)
Breo coupon

## 2018-01-29 NOTE — Assessment & Plan Note (Signed)
On B12 

## 2018-01-29 NOTE — Progress Notes (Signed)
Subjective:  Patient ID: Alexandra [name], female    DOB: 07-15-1965  Age: 52 y.o. MRN: [id number]  CC: No chief complaint on file.   HPI Alexandra [name] presents for DM, HTN, LBP f/u Asthma meds are too $$$   Outpatient Medications Prior to Visit  Medication Sig Dispense Refill  . albuterol (VENTOLIN HFA) 108 (90 BASE) MCG/ACT inhaler Inhale 1 puff into the lungs every 4 (four) hours as needed. 3 Inhaler 3  . bromocriptine (PARLODEL) 2.5 MG tablet Take 1 tablet (2.5 mg total) by mouth daily. 90 tablet 3  . Cholecalciferol 1000 UNITS tablet Take 1 tablet (1,000 Units total) by mouth daily. 100 tablet 3  . cyclobenzaprine (FLEXERIL) 10 MG tablet Take 1 tablet (10 mg total) by mouth 2 (two) times daily as needed. 90 tablet 1  . fluticasone (FLONASE) 50 MCG/ACT nasal spray 2 sprays by Nasal route daily.      . fluticasone furoate-vilanterol (BREO ELLIPTA) 100-25 MCG/INH AEPB Inhale 1 puff into the lungs daily. 1 each 5  . Ginkgo Biloba 60 MG CAPS Take 2 capsules by mouth daily.    Marland Kitchen glucose blood (ONETOUCH VERIO) test strip And lancets 1/day 100 each 3  . hydrochlorothiazide (MICROZIDE) 12.5 MG capsule Take 1 capsule (12.5 mg total) by mouth every morning. 90 capsule 3  . ibuprofen (ADVIL,MOTRIN) 600 MG tablet TAKE 1 TABLET BY MOUTH EVERY 6 HOURS AS NEEDED 90 tablet 3  . loperamide (IMODIUM) 2 MG capsule Take 1 capsule (2 mg total) by mouth 4 (four) times daily as needed for diarrhea or loose stools. 12 capsule 0  . loratadine (CLARITIN) 10 MG tablet Take 1 tablet (10 mg total) by mouth daily as needed for allergies. for allergies 100 tablet 3  . medroxyPROGESTERone (PROVERA) 5 MG tablet Take one tablet a day for 5 days if no period by 11/19/17 5 tablet 2  . metFORMIN (GLUCOPHAGE-XR) 500 MG 24 hr tablet Take 4 tablets (2,000 mg total) by mouth daily with breakfast. 360 tablet 3  . pioglitazone (ACTOS) 45 MG tablet Take 1 tablet (45 mg total) by mouth daily. 90 tablet 3  . promethazine  (PHENERGAN) 25 MG tablet Take 1 tablet (25 mg total) by mouth 2 (two) times daily as needed for nausea. 60 tablet 2  . ranitidine (ZANTAC) 300 MG tablet Take 1 tablet (300 mg total) by mouth daily. 90 tablet 3  . zolpidem (AMBIEN) 10 MG tablet Take 0.5-1 tablets (5-10 mg total) by mouth at bedtime as needed for sleep. 90 tablet 1   No facility-administered medications prior to visit.     ROS: Review of Systems  Constitutional: Negative for activity change, appetite change, chills, fatigue and unexpected weight change.  HENT: Negative for congestion, mouth sores and sinus pressure.   Eyes: Negative for visual disturbance.  Respiratory: Negative for cough, chest tightness and wheezing.   Gastrointestinal: Negative for abdominal pain and nausea.  Genitourinary: Negative for difficulty urinating, frequency and vaginal pain.  Musculoskeletal: Positive for back pain. Negative for gait problem.  Skin: Negative for pallor and rash.  Neurological: Negative for dizziness, tremors, weakness, numbness and headaches.  Psychiatric/Behavioral: Negative for confusion, sleep disturbance and suicidal ideas.    Objective:  BP 116/72 (BP Location: Left Arm, Patient Position: Sitting, Cuff Size: Normal)   Pulse 78   Temp 98.7 F (37.1 C) (Oral)   Ht 5\' 8"  (1.727 m)   Wt 163 lb (73.9 kg)   SpO2 99%   BMI 24.78  kg/m   BP Readings from Last 3 Encounters:  01/29/18 116/72  01/24/18 112/64  11/29/17 (!) 96/58    Wt Readings from Last 3 Encounters:  01/29/18 163 lb (73.9 kg)  01/24/18 162 lb (73.5 kg)  11/29/17 151 lb (68.5 kg)    Physical Exam  Constitutional: She appears well-developed. No distress.  HENT:  Head: Normocephalic.  Right Ear: External ear normal.  Left Ear: External ear normal.  Nose: Nose normal.  Mouth/Throat: Oropharynx is clear and moist.  Eyes: Pupils are equal, round, and reactive to light. Conjunctivae are normal. Right eye exhibits no discharge. Left eye exhibits no  discharge.  Neck: Normal range of motion. Neck supple. No JVD present. No tracheal deviation present. No thyromegaly present.  Cardiovascular: Normal rate, regular rhythm and normal heart sounds.  Pulmonary/Chest: No stridor. No respiratory distress. She has no wheezes.  Abdominal: Soft. Bowel sounds are normal. She exhibits no distension and no mass. There is no tenderness. There is no rebound and no guarding.  Musculoskeletal: She exhibits no edema or tenderness.  Lymphadenopathy:    She has no cervical adenopathy.  Neurological: She displays normal reflexes. No cranial nerve deficit. She exhibits normal muscle tone. Coordination normal.  Skin: No rash noted. No erythema.  Psychiatric: She has a normal mood and affect. Her behavior is normal. Judgment and thought content normal.    Lab Results  Component Value Date   WBC 4.5 07/16/2017   HGB 14.7 07/16/2017   HCT 45.1 07/16/2017   PLT 290 07/16/2017   GLUCOSE 90 07/16/2017   CHOL 176 01/27/2017   TRIG 42.0 01/27/2017   HDL 51.40 01/27/2017   LDLDIRECT 122.0 03/03/2008   LDLCALC 117 (H) 01/27/2017   ALT 23 07/16/2017   AST 30 07/16/2017   NA 137 07/16/2017   K 3.4 (L) 07/16/2017   CL 102 07/16/2017   CREATININE 0.61 07/16/2017   BUN 14 07/16/2017   CO2 22 07/16/2017   TSH 1.79 01/27/2017   HGBA1C 5.3 01/24/2018   MICROALBUR <0.7 01/27/2017    Mm Digital Screening Bilateral  Result Date: 10/11/2017 CLINICAL DATA:  Screening. EXAM: DIGITAL SCREENING BILATERAL MAMMOGRAM WITH CAD COMPARISON:  Previous exam(s). ACR Breast Density Category c: The breast tissue is heterogeneously dense, which may obscure small masses. FINDINGS: There are no findings suspicious for malignancy. Images were processed with CAD. IMPRESSION: No mammographic evidence of malignancy. A result letter of this screening mammogram will be mailed directly to the patient. RECOMMENDATION: Screening mammogram in one year. (Code:SM-B-01Y) BI-RADS CATEGORY  1:  Negative. Electronically Signed   By: Dorise Bullion III M.D   On: 10/11/2017 16:58    Assessment & Plan:   There are no diagnoses linked to this encounter.   No orders of the defined types were placed in this encounter.    Follow-up: No follow-ups on file.  Walker Kehr, MD

## 2018-01-29 NOTE — Assessment & Plan Note (Signed)
HCTZ 

## 2018-01-31 ENCOUNTER — Other Ambulatory Visit: Payer: Self-pay | Admitting: Internal Medicine

## 2018-02-20 ENCOUNTER — Telehealth: Payer: Self-pay | Admitting: Internal Medicine

## 2018-02-20 NOTE — Telephone Encounter (Signed)
Please advise 

## 2018-02-20 NOTE — Telephone Encounter (Signed)
Copied from [place] [id number]. Topic: Quick Communication - See Telephone Encounter >> Feb 20, 2018  4:06 PM Antonieta Iba C wrote: CRM for notification. See Telephone encounter for: 02/20/18.  Pt called in to have her medication ranitidine (ZANTAC) 300 MG tablet changed. Pt says that she was told by the pharmacy that the medication is causing side effects.    Please assist.    Pharmacy: [facility] [address] ([address]), [place] [phone] (Phone) [phone] (Fax)

## 2018-02-21 MED ORDER — FAMOTIDINE 40 MG PO TABS: 40.0000 mg | ORAL_TABLET | Freq: Every day | ORAL | 3 refills | Status: DC

## 2018-02-21 NOTE — Telephone Encounter (Signed)
D/c Zantac Pepcid Rx emailed instead Thx

## 2018-02-22 NOTE — Telephone Encounter (Signed)
Pt.notified

## 2018-03-15 ENCOUNTER — Telehealth: Payer: Self-pay | Admitting: Obstetrics and Gynecology

## 2018-04-10 NOTE — Progress Notes (Deleted)
GYNECOLOGY  VISIT   HPI: 53 y.o.   Single Black or African American Not Hispanic or Latino  female   G0P0000 with No LMP recorded. (Menstrual status: Oral contraceptives).   here for 6 month pap with hpv.     GYNECOLOGIC HISTORY: No LMP recorded. (Menstrual status: Oral contraceptives). Contraception:*** Menopausal hormone therapy: ***        OB History    Gravida  0   Para  0   Term  0   Preterm  0   AB  0   Living  0     SAB  0   TAB  0   Ectopic  0   Multiple  0   Live Births  0              Patient Active Problem List   Diagnosis Date Noted  . Hx of adenomatous colonic polyps 12/15/2017  . Left foot pain 10/07/2016  . Well adult exam 01/18/2016  . DM2 (diabetes mellitus, type 2) ([place]) 05/01/2014  . Leg cramps 12/27/2013  . Wound, open, foot 09/28/2012  . Cramps, extremity 08/27/2012  . Insomnia 05/21/2012  . CTS (carpal tunnel syndrome)   . Unspecified glaucoma(365.9)   . Polyarthralgia   . Vitamin B12 deficiency   . CHEST PAIN 05/28/2009  . LUMBAR RADICULOPATHY, RIGHT 09/25/2008  . URI 03/18/2008  . BRONCHITIS, ACUTE 10/05/2007  . HYPOKALEMIA 07/06/2007  . Essential hypertension 03/11/2007  . ALLERGIC RHINITIS 03/11/2007  . Asthma 03/11/2007  . OSTEOARTHRITIS 03/08/2007  . LOW BACK PAIN 03/08/2007    Past Medical History:  Diagnosis Date  . ALLERGIC RHINITIS   . Allergy   . Asthma   . CTS (carpal tunnel syndrome)   . Diabetes mellitus   . Fibroids    uterine  . Glaucoma    ?  Marland Kitchen Hx of adenomatous colonic polyps 12/15/2017  . Hypertension   . Low back pain   . Osteoarthritis   . Polyarthralgia   . Vitamin B12 deficiency     Past Surgical History:  Procedure Laterality Date  . BACK SURGERY     2011  . cervical bx      Current Outpatient Medications  Medication Sig Dispense Refill  . albuterol (VENTOLIN HFA) 108 (90 BASE) MCG/ACT inhaler Inhale 1 puff into the lungs every 4 (four) hours as needed. 3 Inhaler 3  . BREO  ELLIPTA 100-25 MCG/INH AEPB INHALE 1 PUFF BY MOUTH INTO THE LUNGS ONCE DAILY 60 each 5  . bromocriptine (PARLODEL) 2.5 MG tablet Take 1 tablet (2.5 mg total) by mouth daily. 90 tablet 3  . Cholecalciferol 1000 UNITS tablet Take 1 tablet (1,000 Units total) by mouth daily. 100 tablet 3  . cyclobenzaprine (FLEXERIL) 10 MG tablet Take 1 tablet (10 mg total) by mouth 2 (two) times daily as needed. 90 tablet 1  . famotidine (PEPCID) 40 MG tablet Take 1 tablet (40 mg total) by mouth daily. 90 tablet 3  . fluticasone (FLONASE) 50 MCG/ACT nasal spray 2 sprays by Nasal route daily.      . Ginkgo Biloba 60 MG CAPS Take 2 capsules by mouth daily.    Marland Kitchen glucose blood (ONETOUCH VERIO) test strip And lancets 1/day 100 each 3  . hydrochlorothiazide (MICROZIDE) 12.5 MG capsule Take 1 capsule (12.5 mg total) by mouth every morning. 90 capsule 3  . ibuprofen (ADVIL,MOTRIN) 600 MG tablet TAKE 1 TABLET BY MOUTH EVERY 6 HOURS AS NEEDED 90 tablet 3  . loperamide (  IMODIUM) 2 MG capsule Take 1 capsule (2 mg total) by mouth 4 (four) times daily as needed for diarrhea or loose stools. 12 capsule 0  . loratadine (CLARITIN) 10 MG tablet Take 1 tablet (10 mg total) by mouth daily as needed for allergies. for allergies 100 tablet 3  . medroxyPROGESTERone (PROVERA) 5 MG tablet Take one tablet a day for 5 days if no period by 11/19/17 5 tablet 2  . metFORMIN (GLUCOPHAGE-XR) 500 MG 24 hr tablet Take 4 tablets (2,000 mg total) by mouth daily with breakfast. 360 tablet 3  . pioglitazone (ACTOS) 45 MG tablet Take 1 tablet (45 mg total) by mouth daily. 90 tablet 3  . promethazine (PHENERGAN) 25 MG tablet Take 1 tablet (25 mg total) by mouth 2 (two) times daily as needed for nausea. 60 tablet 2  . zolpidem (AMBIEN) 10 MG tablet Take 0.5-1 tablets (5-10 mg total) by mouth at bedtime as needed for sleep. 90 tablet 1   No current facility-administered medications for this visit.      ALLERGIES: Adhesive [tape]; Benazepril hcl; and  Tramadol hcl  Family History  Problem Relation Age of Onset  . Diabetes Mother   . Kidney disease Mother 23       ESRD on HD  . Heart disease Mother   . Colon polyps Mother   . Cancer Father        head and neck  . Hypertension Unknown   . Stroke Brother 45       CVAx2 on crack  . Breast cancer Neg Hx   . Colitis Neg Hx   . Esophageal cancer Neg Hx   . Rectal cancer Neg Hx   . Stomach cancer Neg Hx     Social History   Socioeconomic History  . Marital status: Single    Spouse name: Not on file  . Number of children: Not on file  . Years of education: Not on file  . Highest education level: Not on file  Occupational History  . Occupation: WORKS SECOND SHIFT    Employer: [employer]    Comment: Public house manager  Social Needs  . Financial resource strain: Not on file  . Food insecurity:    Worry: Not on file    Inability: Not on file  . Transportation needs:    Medical: Not on file    Non-medical: Not on file  Tobacco Use  . Smoking status: Never Smoker  . Smokeless tobacco: Never Used  Substance and Sexual Activity  . Alcohol use: Yes    Comment: occasional wine  . Drug use: No  . Sexual activity: Not Currently    Birth control/protection: None    Comment: not sexually active at this time 11-15-17  Lifestyle  . Physical activity:    Days per week: Not on file    Minutes per session: Not on file  . Stress: Not on file  Relationships  . Social connections:    Talks on phone: Not on file    Gets together: Not on file    Attends religious service: Not on file    Active member of club or organization: Not on file    Attends meetings of clubs or organizations: Not on file    Relationship status: Not on file  . Intimate partner violence:    Fear of current or ex partner: Not on file    Emotionally abused: Not on file    Physically abused: Not on file  Forced sexual activity: Not on file  Other Topics Concern  . Not on file  Social History Narrative   . Not on file    ROS  PHYSICAL EXAMINATION:    There were no vitals taken for this visit.    General appearance: alert, cooperative and appears stated age Neck: no adenopathy, supple, symmetrical, trachea midline and thyroid {CHL AMB PHY EX THYROID NORM DEFAULT:[phone]::"normal to inspection and palpation"} Breasts: {Exam; breast:13139::"normal appearance, no masses or tenderness"} Abdomen: soft, non-tender; non distended, no masses,  no organomegaly  Pelvic: External genitalia:  no lesions              Urethra:  normal appearing urethra with no masses, tenderness or lesions              Bartholins and Skenes: normal                 Vagina: normal appearing vagina with normal color and discharge, no lesions              Cervix: {CHL AMB PHY EX CERVIX NORM DEFAULT:[phone]::"no lesions"}              Bimanual Exam:  Uterus:  {CHL AMB PHY EX UTERUS NORM DEFAULT:[phone]::"normal size, contour, position, consistency, mobility, non-tender"}              Adnexa: {CHL AMB PHY EX ADNEXA NO MASS DEFAULT:[phone]::"no mass, fullness, tenderness"}              Rectovaginal: {yes no:[id number]}.  Confirms.              Anus:  normal sphincter tone, no lesions  Chaperone was present for exam.  ASSESSMENT     PLAN    An After Visit Summary was printed and given to the patient.  *** minutes face to face time of which over 50% was spent in counseling.

## 2018-04-16 ENCOUNTER — Ambulatory Visit: Payer: Self-pay | Admitting: Obstetrics and Gynecology

## 2018-04-19 NOTE — Progress Notes (Signed)
GYNECOLOGY  VISIT   HPI: 53 y.o.   Single Black or African American Not Hispanic or Latino  female   [id number] with No LMP recorded (within years). (Menstrual status: Other).   here for 6 month pap and HPV.   Her pap from 7/19 returned as ASC-H, +HPV, Colposcopy was unsatisfactory, ECC negative, 2 vaginal biopsies returned with VAIN I.  She is PMP and is having some vasomotor symptoms. Using over the counter supplements.  She has a new boyfriend, planning on being sexually active. Hasn't been sexually active since her negative STD testing last year.   GYNECOLOGIC HISTORY: No LMP recorded (within years). (Menstrual status: Other). Contraception: none  Menopausal hormone therapy: none         OB History    Gravida  0   Para  0   Term  0   Preterm  0   AB  0   Living  0     SAB  0   TAB  0   Ectopic  0   Multiple  0   Live Births  0              Patient Active Problem List   Diagnosis Date Noted  . Hx of adenomatous colonic polyps 12/15/2017  . Left foot pain 10/07/2016  . Well adult exam 01/18/2016  . DM2 (diabetes mellitus, type 2) ([place]) 05/01/2014  . Leg cramps 12/27/2013  . Wound, open, foot 09/28/2012  . Cramps, extremity 08/27/2012  . Insomnia 05/21/2012  . CTS (carpal tunnel syndrome)   . Unspecified glaucoma(365.9)   . Polyarthralgia   . Vitamin B12 deficiency   . CHEST PAIN 05/28/2009  . LUMBAR RADICULOPATHY, RIGHT 09/25/2008  . URI 03/18/2008  . BRONCHITIS, ACUTE 10/05/2007  . HYPOKALEMIA 07/06/2007  . Essential hypertension 03/11/2007  . ALLERGIC RHINITIS 03/11/2007  . Asthma 03/11/2007  . OSTEOARTHRITIS 03/08/2007  . LOW BACK PAIN 03/08/2007    Past Medical History:  Diagnosis Date  . ALLERGIC RHINITIS   . Allergy   . Asthma   . CTS (carpal tunnel syndrome)   . Diabetes mellitus   . Fibroids    uterine  . Glaucoma    ?  Marland Kitchen Hx of adenomatous colonic polyps 12/15/2017  . Hypertension   . Low back pain   . Osteoarthritis   .  Polyarthralgia   . Vitamin B12 deficiency     Past Surgical History:  Procedure Laterality Date  . BACK SURGERY     2011  . cervical bx      Current Outpatient Medications  Medication Sig Dispense Refill  . albuterol (VENTOLIN HFA) 108 (90 BASE) MCG/ACT inhaler Inhale 1 puff into the lungs every 4 (four) hours as needed. 3 Inhaler 3  . BREO ELLIPTA 100-25 MCG/INH AEPB INHALE 1 PUFF BY MOUTH INTO THE LUNGS ONCE DAILY 60 each 5  . bromocriptine (PARLODEL) 2.5 MG tablet Take 1 tablet (2.5 mg total) by mouth daily. 90 tablet 3  . Cholecalciferol 1000 UNITS tablet Take 1 tablet (1,000 Units total) by mouth daily. 100 tablet 3  . cyclobenzaprine (FLEXERIL) 10 MG tablet Take 1 tablet (10 mg total) by mouth 2 (two) times daily as needed. 90 tablet 1  . famotidine (PEPCID) 40 MG tablet Take 1 tablet (40 mg total) by mouth daily. 90 tablet 3  . fluticasone (FLONASE) 50 MCG/ACT nasal spray 2 sprays by Nasal route daily.      . Ginkgo Biloba 60 MG CAPS Take 2 capsules  by mouth daily.    Marland Kitchen glucose blood (ONETOUCH VERIO) test strip And lancets 1/day 100 each 3  . hydrochlorothiazide (MICROZIDE) 12.5 MG capsule Take 1 capsule (12.5 mg total) by mouth every morning. 90 capsule 3  . ibuprofen (ADVIL,MOTRIN) 600 MG tablet TAKE 1 TABLET BY MOUTH EVERY 6 HOURS AS NEEDED 90 tablet 3  . loratadine (CLARITIN) 10 MG tablet Take 1 tablet (10 mg total) by mouth daily as needed for allergies. for allergies 100 tablet 3  . metFORMIN (GLUCOPHAGE-XR) 500 MG 24 hr tablet Take 4 tablets (2,000 mg total) by mouth daily with breakfast. 360 tablet 3  . pioglitazone (ACTOS) 45 MG tablet Take 1 tablet (45 mg total) by mouth daily. 90 tablet 3  . promethazine (PHENERGAN) 25 MG tablet Take 1 tablet (25 mg total) by mouth 2 (two) times daily as needed for nausea. 60 tablet 2  . medroxyPROGESTERone (PROVERA) 5 MG tablet Take one tablet a day for 5 days if no period by 11/19/17 (Patient not taking: Reported on 04/20/2018) 5 tablet 2   . zolpidem (AMBIEN) 10 MG tablet Take 0.5-1 tablets (5-10 mg total) by mouth at bedtime as needed for sleep. 90 tablet 1   No current facility-administered medications for this visit.      ALLERGIES: Adhesive [tape]; Benazepril hcl; and Tramadol hcl  Family History  Problem Relation Age of Onset  . Diabetes Mother   . Kidney disease Mother 47       ESRD on HD  . Heart disease Mother   . Colon polyps Mother   . Cancer Father        head and neck  . Hypertension Unknown   . Stroke Brother 45       CVAx2 on crack  . Breast cancer Neg Hx   . Colitis Neg Hx   . Esophageal cancer Neg Hx   . Rectal cancer Neg Hx   . Stomach cancer Neg Hx     Social History   Socioeconomic History  . Marital status: Single    Spouse name: Not on file  . Number of children: Not on file  . Years of education: Not on file  . Highest education level: Not on file  Occupational History  . Occupation: WORKS SECOND SHIFT    Employer: [employer]    Comment: Public house manager  Social Needs  . Financial resource strain: Not on file  . Food insecurity:    Worry: Not on file    Inability: Not on file  . Transportation needs:    Medical: Not on file    Non-medical: Not on file  Tobacco Use  . Smoking status: Never Smoker  . Smokeless tobacco: Never Used  Substance and Sexual Activity  . Alcohol use: Yes    Comment: occasional wine  . Drug use: No  . Sexual activity: Not Currently    Birth control/protection: None    Comment: not sexually active at this time 11-15-17  Lifestyle  . Physical activity:    Days per week: Not on file    Minutes per session: Not on file  . Stress: Not on file  Relationships  . Social connections:    Talks on phone: Not on file    Gets together: Not on file    Attends religious service: Not on file    Active member of club or organization: Not on file    Attends meetings of clubs or organizations: Not on file    Relationship status:  Not on file  . Intimate  partner violence:    Fear of current or ex partner: Not on file    Emotionally abused: Not on file    Physically abused: Not on file    Forced sexual activity: Not on file  Other Topics Concern  . Not on file  Social History Narrative  . Not on file    Review of Systems  Constitutional: Negative.   HENT: Negative.   Eyes: Negative.   Respiratory: Negative.   Cardiovascular: Negative.   Gastrointestinal: Negative.   Genitourinary: Negative.   Musculoskeletal: Negative.   Skin: Negative.   Neurological: Negative.   Endo/Heme/Allergies: Negative.   Psychiatric/Behavioral: Negative.     PHYSICAL EXAMINATION:    BP (!) 142/96 (BP Location: Right Arm, Patient Position: Sitting, Cuff Size: Normal)   Pulse 78   Resp 14   Ht 5\' 8"  (1.727 m)   Wt 163 lb 1.6 oz (74 kg)   LMP  (Within Years)   BMI 24.80 kg/m     General appearance: alert, cooperative and appears stated age  Pelvic: External genitalia:  no lesions              Urethra:  normal appearing urethra with no masses, tenderness or lesions              Bartholins and Skenes: normal                 Vagina: very atrophic appearing vagina some bleeding with opening the speculum              Cervix: no lesions                Chaperone was present for exam.  ASSESSMENT ASC-H/+HPV pap, colposcopy unsatisfactory with negative ECC and Vain I on biopsy Significant vaginal atrophy, plans to be sexually active.     PLAN Pap with hpv Use condoms with new partner Use lubricant as needed Discussed the option of vaginal estrogen, she wants to start it. Script called in    An After Visit Summary was printed and given to the patient.

## 2018-04-20 ENCOUNTER — Other Ambulatory Visit (HOSPITAL_COMMUNITY)
Admission: RE | Admit: 2018-04-20 | Discharge: 2018-04-20 | Disposition: A | Discharge status: Home / Self Care | Payer: Commercial Managed Care - PPO | Source: Ambulatory Visit | Attending: Obstetrics and Gynecology | Admitting: Obstetrics and Gynecology

## 2018-04-20 ENCOUNTER — Encounter: Payer: Self-pay | Admitting: Obstetrics and Gynecology

## 2018-04-20 ENCOUNTER — Other Ambulatory Visit: Payer: Self-pay

## 2018-04-20 ENCOUNTER — Ambulatory Visit (INDEPENDENT_AMBULATORY_CARE_PROVIDER_SITE_OTHER): Payer: Commercial Managed Care - PPO | Admitting: Obstetrics and Gynecology

## 2018-04-20 VITALS — BP 142/96 | HR 78 | Resp 14 | Ht 68.0 in | Wt 163.1 lb

## 2018-04-20 DIAGNOSIS — Z1272 Encounter for screening for malignant neoplasm of vagina: Secondary | ICD-10-CM | POA: Diagnosis present

## 2018-04-20 DIAGNOSIS — N890 Mild vaginal dysplasia: Secondary | ICD-10-CM | POA: Diagnosis not present

## 2018-04-20 DIAGNOSIS — N952 Postmenopausal atrophic vaginitis: Secondary | ICD-10-CM | POA: Diagnosis not present

## 2018-04-20 DIAGNOSIS — Z124 Encounter for screening for malignant neoplasm of cervix: Secondary | ICD-10-CM

## 2018-04-20 MED ORDER — ESTRADIOL 10 MCG VA TABS: 1.0000 | ORAL_TABLET | VAGINAL | 1 refills | Status: DC

## 2018-04-24 LAB — CYTOLOGY - PAP
Diagnosis: NEGATIVE
HPV (WINDOPATH): DETECTED — AB

## 2018-04-30 ENCOUNTER — Ambulatory Visit (INDEPENDENT_AMBULATORY_CARE_PROVIDER_SITE_OTHER): Payer: Commercial Managed Care - PPO | Admitting: Family

## 2018-04-30 ENCOUNTER — Encounter: Payer: Self-pay | Admitting: Family

## 2018-04-30 VITALS — BP 138/88 | HR 81 | Temp 97.6°F | Ht 68.0 in | Wt 168.0 lb

## 2018-04-30 DIAGNOSIS — R21 Rash and other nonspecific skin eruption: Secondary | ICD-10-CM

## 2018-04-30 MED ORDER — FLUTICASONE FUROATE-VILANTEROL 100-25 MCG/INH IN AEPB: 1.0000 | INHALATION_SPRAY | Freq: Two times a day (BID) | RESPIRATORY_TRACT | 2 refills | Status: DC

## 2018-04-30 MED ORDER — MOMETASONE FUROATE 0.1 % EX CREA: 1.0000 "application " | TOPICAL_CREAM | Freq: Two times a day (BID) | CUTANEOUS | 0 refills | Status: DC

## 2018-04-30 NOTE — Progress Notes (Signed)
Alexandra [name] is a 53 y.o. female with the following history as recorded in [employer]:  Patient Active Problem List   Diagnosis Date Noted  . Hx of adenomatous colonic polyps 12/15/2017  . Left foot pain 10/07/2016  . Well adult exam 01/18/2016  . DM2 (diabetes mellitus, type 2) ([place]) 05/01/2014  . Leg cramps 12/27/2013  . Wound, open, foot 09/28/2012  . Cramps, extremity 08/27/2012  . Insomnia 05/21/2012  . CTS (carpal tunnel syndrome)   . Unspecified glaucoma(365.9)   . Polyarthralgia   . Vitamin B12 deficiency   . CHEST PAIN 05/28/2009  . LUMBAR RADICULOPATHY, RIGHT 09/25/2008  . URI 03/18/2008  . BRONCHITIS, ACUTE 10/05/2007  . HYPOKALEMIA 07/06/2007  . Essential hypertension 03/11/2007  . ALLERGIC RHINITIS 03/11/2007  . Asthma 03/11/2007  . OSTEOARTHRITIS 03/08/2007  . LOW BACK PAIN 03/08/2007    Current Outpatient Medications  Medication Sig Dispense Refill  . albuterol (VENTOLIN HFA) 108 (90 BASE) MCG/ACT inhaler Inhale 1 puff into the lungs every 4 (four) hours as needed. 3 Inhaler 3  . bromocriptine (PARLODEL) 2.5 MG tablet Take 1 tablet (2.5 mg total) by mouth daily. 90 tablet 3  . budesonide-formoterol (SYMBICORT) 160-4.5 MCG/ACT inhaler Symbicort 160 mcg-4.5 mcg/actuation HFA aerosol inhaler    . Cholecalciferol 1000 UNITS tablet Take 1 tablet (1,000 Units total) by mouth daily. 100 tablet 3  . cyclobenzaprine (FLEXERIL) 10 MG tablet Take 1 tablet (10 mg total) by mouth 2 (two) times daily as needed. 90 tablet 1  . Estradiol 10 MCG TABS vaginal tablet Place 1 tablet (10 mcg total) vaginally 2 (two) times a week. 24 tablet 1  . famotidine (PEPCID) 40 MG tablet Take 1 tablet (40 mg total) by mouth daily. 90 tablet 3  . fluticasone (FLONASE) 50 MCG/ACT nasal spray 2 sprays by Nasal route daily.      . fluticasone furoate-vilanterol (BREO ELLIPTA) 100-25 MCG/INH AEPB Inhale 1 puff into the lungs 2 (two) times daily. 1 each 2  . Ginkgo Biloba 60 MG CAPS Take 2 capsules  by mouth daily.    Marland Kitchen glucose blood (ONETOUCH VERIO) test strip And lancets 1/day 100 each 3  . hydrochlorothiazide (MICROZIDE) 12.5 MG capsule Take 1 capsule (12.5 mg total) by mouth every morning. 90 capsule 3  . ibuprofen (ADVIL,MOTRIN) 600 MG tablet TAKE 1 TABLET BY MOUTH EVERY 6 HOURS AS NEEDED 90 tablet 3  . loratadine (CLARITIN) 10 MG tablet Take 1 tablet (10 mg total) by mouth daily as needed for allergies. for allergies 100 tablet 3  . medroxyPROGESTERone (PROVERA) 5 MG tablet Take one tablet a day for 5 days if no period by 11/19/17 5 tablet 2  . metFORMIN (GLUCOPHAGE-XR) 500 MG 24 hr tablet Take 4 tablets (2,000 mg total) by mouth daily with breakfast. 360 tablet 3  . pioglitazone (ACTOS) 45 MG tablet Take 1 tablet (45 mg total) by mouth daily. 90 tablet 3  . promethazine (PHENERGAN) 25 MG tablet Take 1 tablet (25 mg total) by mouth 2 (two) times daily as needed for nausea. 60 tablet 2  . mometasone (ELOCON) 0.1 % cream Apply 1 application topically 2 (two) times daily. 45 g 0  . zolpidem (AMBIEN) 10 MG tablet Take 0.5-1 tablets (5-10 mg total) by mouth at bedtime as needed for sleep. 90 tablet 1   No current facility-administered medications for this visit.     Allergies: Adhesive [tape]; Benazepril hcl; and Tramadol hcl  Past Medical History:  Diagnosis Date  . ALLERGIC RHINITIS   .  Allergy   . Asthma   . CTS (carpal tunnel syndrome)   . Diabetes mellitus   . Fibroids    uterine  . Glaucoma    ?  Marland Kitchen Hx of adenomatous colonic polyps 12/15/2017  . Hypertension   . Low back pain   . Osteoarthritis   . Polyarthralgia   . Vitamin B12 deficiency     Past Surgical History:  Procedure Laterality Date  . BACK SURGERY     2011  . cervical bx      Family History  Problem Relation Age of Onset  . Diabetes Mother   . Kidney disease Mother 31       ESRD on HD  . Heart disease Mother   . Colon polyps Mother   . Cancer Father        head and neck  . Hypertension Unknown   .  Stroke Brother 45       CVAx2 on crack  . Breast cancer Neg Hx   . Colitis Neg Hx   . Esophageal cancer Neg Hx   . Rectal cancer Neg Hx   . Stomach cancer Neg Hx     Social History   Tobacco Use  . Smoking status: Never Smoker  . Smokeless tobacco: Never Used  Substance Use Topics  . Alcohol use: Yes    Comment: occasional wine    Subjective:  Patient presents with concerns for rash noted on side of right neck x 1 week; denies any new soaps, foods, detergents or medications. Has experienced hives in the past; does feel that symptoms have improved some in the past few days; has not taken any Benadryl for symptom relief;    Objective:  Vitals:   04/30/18 1337  BP: 138/88  Pulse: 81  Temp: 97.6 F (36.4 C)  TempSrc: Oral  SpO2: 99%  Weight: 168 lb (76.2 kg)  Height: 5\' 8"  (1.727 m)    General: Well developed, well nourished, in no acute distress  Skin : Warm and dry. Localized raised area of red lesions noted on right side of neck;  Head: Normocephalic and atraumatic  Eyes: Sclera and conjunctiva clear; pupils round and reactive to light; extraocular movements intact  Ears: External normal; canals clear; tympanic membranes normal  Oropharynx: Pink, supple. No suspicious lesions  Neck: Supple without thyromegaly, adenopathy  Lungs: Respirations unlabored;  Neurologic: Alert and oriented; speech intact; face symmetrical; moves all extremities well; CNII-XII intact without focal deficit    Assessment:  1. Rash     Plan:  ? Hives; okay to use Benadryl; trial of Elocon to apply bid to affected area; follow-up worse, no better.   No follow-ups on file.  No orders of the defined types were placed in this encounter.   Requested Prescriptions   Signed Prescriptions Disp Refills  . mometasone (ELOCON) 0.1 % cream 45 g 0    Sig: Apply 1 application topically 2 (two) times daily.  . fluticasone furoate-vilanterol (BREO ELLIPTA) 100-25 MCG/INH AEPB 1 each 2    Sig: Inhale 1  puff into the lungs 2 (two) times daily.

## 2018-05-02 ENCOUNTER — Ambulatory Visit: Payer: Commercial Managed Care - PPO | Admitting: Endocrinology

## 2018-05-04 ENCOUNTER — Ambulatory Visit (INDEPENDENT_AMBULATORY_CARE_PROVIDER_SITE_OTHER): Payer: Commercial Managed Care - PPO | Admitting: Endocrinology

## 2018-05-04 ENCOUNTER — Encounter: Payer: Self-pay | Admitting: Endocrinology

## 2018-05-04 VITALS — BP 110/78 | HR 95 | Ht 68.0 in | Wt 166.0 lb

## 2018-05-04 DIAGNOSIS — E1140 Type 2 diabetes mellitus with diabetic neuropathy, unspecified: Secondary | ICD-10-CM | POA: Diagnosis not present

## 2018-05-04 LAB — POCT GLYCOSYLATED HEMOGLOBIN (HGB A1C): Hemoglobin A1C: 6.4 % — AB (ref 4.0–5.6)

## 2018-05-04 NOTE — Patient Instructions (Addendum)
Please continue the same diabetes medications.   check your blood sugar once a day.  vary the time of day when you check, between before the 3 meals, and at bedtime.  also check if you have symptoms of your blood sugar being too high or too low.  please keep a record of the readings and bring it to your next appointment here (or you can bring the meter itself).  You can write it on any piece of paper.  please call us sooner if your blood sugar goes below 70, or if you have a lot of readings over 200.   Please come back for a follow-up appointment in 4 months.   

## 2018-05-04 NOTE — Progress Notes (Signed)
Subjective:    Patient ID: Alexandra [name], female    DOB: January 02, 1966, 53 y.o.   MRN: [id number]  HPI Pt returns for f/u of diabetes mellitus: DM type: 2 (she is presumed to be evolving type 1, based on lean body habitus, and mother's h/o type 1).   Dx'ed: 3474 Complications: none Therapy: 3 oral meds.  GDM: never DKA: never Severe hypoglycemia: never Pancreatitis: never Other: she has never been on insulin, but she has learned how; She has a high-deductible plan, on which she cannot afford brand-name meds.  Interval history: no cbg record, but states it varies from 70-145.  pt states she feels well in general.  She takes meds as rx'ed.   Past Medical History:  Diagnosis Date  . ALLERGIC RHINITIS   . Allergy   . Asthma   . CTS (carpal tunnel syndrome)   . Diabetes mellitus   . Fibroids    uterine  . Glaucoma    ?  Marland Kitchen Hx of adenomatous colonic polyps 12/15/2017  . Hypertension   . Low back pain   . Osteoarthritis   . Polyarthralgia   . Vitamin B12 deficiency     Past Surgical History:  Procedure Laterality Date  . BACK SURGERY     2011  . cervical bx      Social History   Socioeconomic History  . Marital status: Single    Spouse name: Not on file  . Number of children: Not on file  . Years of education: Not on file  . Highest education level: Not on file  Occupational History  . Occupation: WORKS SECOND SHIFT    Employer: [employer]    Comment: Public house manager  Social Needs  . Financial resource strain: Not on file  . Food insecurity:    Worry: Not on file    Inability: Not on file  . Transportation needs:    Medical: Not on file    Non-medical: Not on file  Tobacco Use  . Smoking status: Never Smoker  . Smokeless tobacco: Never Used  Substance and Sexual Activity  . Alcohol use: Yes    Comment: occasional wine  . Drug use: No  . Sexual activity: Not Currently    Birth control/protection: None    Comment: not sexually active at this time  11-15-17  Lifestyle  . Physical activity:    Days per week: Not on file    Minutes per session: Not on file  . Stress: Not on file  Relationships  . Social connections:    Talks on phone: Not on file    Gets together: Not on file    Attends religious service: Not on file    Active member of club or organization: Not on file    Attends meetings of clubs or organizations: Not on file    Relationship status: Not on file  . Intimate partner violence:    Fear of current or ex partner: Not on file    Emotionally abused: Not on file    Physically abused: Not on file    Forced sexual activity: Not on file  Other Topics Concern  . Not on file  Social History Narrative  . Not on file    Current Outpatient Medications on File Prior to Visit  Medication Sig Dispense Refill  . albuterol (VENTOLIN HFA) 108 (90 BASE) MCG/ACT inhaler Inhale 1 puff into the lungs every 4 (four) hours as needed. 3 Inhaler 3  . bromocriptine (PARLODEL) 2.5  MG tablet Take 1 tablet (2.5 mg total) by mouth daily. 90 tablet 3  . budesonide-formoterol (SYMBICORT) 160-4.5 MCG/ACT inhaler Symbicort 160 mcg-4.5 mcg/actuation HFA aerosol inhaler    . Cholecalciferol 1000 UNITS tablet Take 1 tablet (1,000 Units total) by mouth daily. 100 tablet 3  . cyclobenzaprine (FLEXERIL) 10 MG tablet Take 1 tablet (10 mg total) by mouth 2 (two) times daily as needed. 90 tablet 1  . famotidine (PEPCID) 40 MG tablet Take 1 tablet (40 mg total) by mouth daily. 90 tablet 3  . fluticasone (FLONASE) 50 MCG/ACT nasal spray 2 sprays by Nasal route daily.      . fluticasone furoate-vilanterol (BREO ELLIPTA) 100-25 MCG/INH AEPB Inhale 1 puff into the lungs 2 (two) times daily. 1 each 2  . Ginkgo Biloba 60 MG CAPS Take 2 capsules by mouth daily.    Marland Kitchen glucose blood (ONETOUCH VERIO) test strip And lancets 1/day 100 each 3  . hydrochlorothiazide (MICROZIDE) 12.5 MG capsule Take 1 capsule (12.5 mg total) by mouth every morning. 90 capsule 3  .  ibuprofen (ADVIL,MOTRIN) 600 MG tablet TAKE 1 TABLET BY MOUTH EVERY 6 HOURS AS NEEDED 90 tablet 3  . loratadine (CLARITIN) 10 MG tablet Take 1 tablet (10 mg total) by mouth daily as needed for allergies. for allergies 100 tablet 3  . metFORMIN (GLUCOPHAGE-XR) 500 MG 24 hr tablet Take 4 tablets (2,000 mg total) by mouth daily with breakfast. 360 tablet 3  . mometasone (ELOCON) 0.1 % cream Apply 1 application topically 2 (two) times daily. 45 g 0  . pioglitazone (ACTOS) 45 MG tablet Take 1 tablet (45 mg total) by mouth daily. 90 tablet 3  . promethazine (PHENERGAN) 25 MG tablet Take 1 tablet (25 mg total) by mouth 2 (two) times daily as needed for nausea. 60 tablet 2  . Estradiol 10 MCG TABS vaginal tablet Place 1 tablet (10 mcg total) vaginally 2 (two) times a week. (Patient not taking: Reported on 05/04/2018) 24 tablet 1  . medroxyPROGESTERone (PROVERA) 5 MG tablet Take one tablet a day for 5 days if no period by 11/19/17 (Patient not taking: Reported on 05/04/2018) 5 tablet 2  . zolpidem (AMBIEN) 10 MG tablet Take 0.5-1 tablets (5-10 mg total) by mouth at bedtime as needed for sleep. 90 tablet 1   No current facility-administered medications on file prior to visit.     Allergies  Allergen Reactions  . Adhesive [Tape] Other (See Comments)    EKG lead adhesive caused skin break down  . Benazepril Hcl     REACTION: cough  . Tramadol Hcl     REACTION: itching    Family History  Problem Relation Age of Onset  . Diabetes Mother   . Kidney disease Mother 60       ESRD on HD  . Heart disease Mother   . Colon polyps Mother   . Cancer Father        head and neck  . Hypertension Unknown   . Stroke Brother 45       CVAx2 on crack  . Breast cancer Neg Hx   . Colitis Neg Hx   . Esophageal cancer Neg Hx   . Rectal cancer Neg Hx   . Stomach cancer Neg Hx     BP 110/78 (BP Location: Left Arm, Patient Position: Sitting, Cuff Size: Normal)   Pulse 95   Ht 5\' 8"  (1.727 m)   Wt 166 lb (75.3 kg)    SpO2 98%  BMI 25.24 kg/m    Review of Systems She denies hypoglycemia.      Objective:   Physical Exam VITAL SIGNS:  See vs page GENERAL: no distress Pulses: dorsalis pedis intact bilat.   MSK: no deformity of the feet CV: no leg edema Skin:  no ulcer on the feet.  normal color and temp on the feet. Neuro: sensation is intact to touch on the feet  Lab Results  Component Value Date   HGBA1C 6.4 (A) 05/04/2018        Assessment & Plan:  Type 2 DM: well-controlled Lean body habitus: we discussed the likely evolution of type 1 DM.  Patient Instructions  Please continue the same diabetes medications.   check your blood sugar once a day.  vary the time of day when you check, between before the 3 meals, and at bedtime.  also check if you have symptoms of your blood sugar being too high or too low.  please keep a record of the readings and bring it to your next appointment here (or you can bring the meter itself).  You can write it on any piece of paper.  please call us sooner if your blood sugar goes below 70, or if you have a lot of readings over 200.  Please come back for a follow-up appointment in 4 months.

## 2018-05-22 ENCOUNTER — Emergency Department (HOSPITAL_COMMUNITY)
Admission: EM | Admit: 2018-05-22 | Discharge: 2018-05-22 | Disposition: A | Discharge status: Home / Self Care | Payer: Commercial Managed Care - PPO | Attending: Emergency Medicine | Admitting: Emergency Medicine

## 2018-05-22 ENCOUNTER — Encounter (HOSPITAL_COMMUNITY): Payer: Self-pay

## 2018-05-22 ENCOUNTER — Other Ambulatory Visit: Payer: Self-pay

## 2018-05-22 DIAGNOSIS — Z5321 Procedure and treatment not carried out due to patient leaving prior to being seen by health care provider: Secondary | ICD-10-CM | POA: Insufficient documentation

## 2018-05-22 DIAGNOSIS — Y939 Activity, unspecified: Secondary | ICD-10-CM | POA: Insufficient documentation

## 2018-05-22 DIAGNOSIS — Y998 Other external cause status: Secondary | ICD-10-CM | POA: Diagnosis not present

## 2018-05-22 DIAGNOSIS — Y92410 Unspecified street and highway as the place of occurrence of the external cause: Secondary | ICD-10-CM | POA: Insufficient documentation

## 2018-05-22 DIAGNOSIS — M545 Low back pain: Secondary | ICD-10-CM | POA: Insufficient documentation

## 2018-05-22 DIAGNOSIS — I10 Essential (primary) hypertension: Secondary | ICD-10-CM | POA: Diagnosis not present

## 2018-05-22 NOTE — ED Notes (Signed)
Pt states she doesn't want to wait anymore, wants to leave without being seen. Advised to return if symptoms worsen
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
## 2018-06-06 ENCOUNTER — Other Ambulatory Visit: Payer: Self-pay | Admitting: Internal Medicine

## 2018-07-23 DIAGNOSIS — N390 Urinary tract infection, site not specified: Secondary | ICD-10-CM | POA: Diagnosis not present

## 2018-07-29 ENCOUNTER — Other Ambulatory Visit: Payer: Self-pay | Admitting: Endocrinology

## 2018-08-12 ENCOUNTER — Other Ambulatory Visit: Payer: Self-pay | Admitting: Internal Medicine

## 2018-08-19 ENCOUNTER — Other Ambulatory Visit: Payer: Self-pay | Admitting: Internal Medicine

## 2018-08-31 ENCOUNTER — Telehealth: Payer: Self-pay | Admitting: Endocrinology

## 2018-08-31 MED ORDER — METFORMIN HCL 1000 MG PO TABS: 1000.0000 mg | ORAL_TABLET | Freq: Two times a day (BID) | ORAL | 3 refills | Status: DC

## 2018-08-31 NOTE — Telephone Encounter (Signed)
please contact patient: I have sent a prescription to your pharmacy, to change to non-XR metformin.

## 2018-09-03 ENCOUNTER — Ambulatory Visit: Payer: Self-pay | Admitting: Internal Medicine

## 2018-09-03 ENCOUNTER — Telehealth: Payer: Self-pay | Admitting: Internal Medicine

## 2018-09-03 NOTE — Telephone Encounter (Signed)
Pt on schedule tomorrow

## 2018-09-03 NOTE — Telephone Encounter (Signed)
Pt called in c/o right ankle being swollen and painful with numbness on and off in her foot for the last 3 months.  I warm transferred the call to Dr. Judeen Hammans office to be scheduled.  I sent my notes to the office.   Reason for Disposition . [1] MODERATE pain (e.g., interferes with normal activities, limping) AND [2] present > 3 days  Answer Assessment - Initial Assessment Questions 1. LOCATION: "Which joint is swollen?"     My right ankle is swollen and numb.   I have cramps in it at night.   My foot is numb.  When I'm walking it goes numb.  Color is the same as the left foot.  I'm diabetic.  No wounds on right leg.   My veins are knotty.   2. ONSET: "When did the swelling start?"     It's been going on for 3 months but it's getting worse. 3. SIZE: "How large is the swelling?"     I can see my ankle bone. At work I'm on my feet and it swells worse.  It aches too 4. PAIN: "Is there any pain?" If so, ask: "How bad is it?" (Scale 1-10; or mild, moderate, severe)     It aches.  10 on pain scale 5. CAUSE: "What do you think caused the swollen joint?"     I really don't know.    My left foot gets numb too but this right is worse.   My hands get numb too.    6. OTHER SYMPTOMS: "Do you have any other symptoms?" (e.g., fever, chest pain, difficulty breathing, calf pain)     My hands are numb too.   They are getting numb at various times off and on all the time.   I'm not on insulin.   I don't check my sugar the battery is dead in my meter. 7. PREGNANCY: "Is there any chance you are pregnant?" "When was your last menstrual period?"     Not asked  Protocols used: [facility]

## 2018-09-03 NOTE — Telephone Encounter (Signed)
Copied from [place] [phone]. Topic: Appointment Scheduling - Scheduling Inquiry for Clinic >> Sep 03, 2018  9:11 AM Mathis Bud wrote: Reason for CRM: Reason for CRM: Patient is calling to set up an appt with PCP due to right leg pain.  Patient states ankle is swollen, leg/ankle becomes swollen when walking.  Patient has been having symptoms for 3 months Patient call back # [phone]

## 2018-09-04 ENCOUNTER — Other Ambulatory Visit: Payer: Self-pay

## 2018-09-04 ENCOUNTER — Encounter: Payer: Self-pay | Admitting: Internal Medicine

## 2018-09-04 ENCOUNTER — Other Ambulatory Visit (INDEPENDENT_AMBULATORY_CARE_PROVIDER_SITE_OTHER): Payer: Commercial Managed Care - PPO

## 2018-09-04 ENCOUNTER — Ambulatory Visit (INDEPENDENT_AMBULATORY_CARE_PROVIDER_SITE_OTHER): Payer: Commercial Managed Care - PPO | Admitting: Internal Medicine

## 2018-09-04 DIAGNOSIS — E1142 Type 2 diabetes mellitus with diabetic polyneuropathy: Secondary | ICD-10-CM

## 2018-09-04 DIAGNOSIS — M2550 Pain in unspecified joint: Secondary | ICD-10-CM | POA: Diagnosis not present

## 2018-09-04 DIAGNOSIS — G4700 Insomnia, unspecified: Secondary | ICD-10-CM

## 2018-09-04 DIAGNOSIS — E538 Deficiency of other specified B group vitamins: Secondary | ICD-10-CM

## 2018-09-04 DIAGNOSIS — E1140 Type 2 diabetes mellitus with diabetic neuropathy, unspecified: Secondary | ICD-10-CM | POA: Insufficient documentation

## 2018-09-04 LAB — BASIC METABOLIC PANEL
BUN: 18 mg/dL (ref 6–23)
CO2: 31 mEq/L (ref 19–32)
Calcium: 9.9 mg/dL (ref 8.4–10.5)
Chloride: 102 mEq/L (ref 96–112)
Creatinine, Ser: 0.63 mg/dL (ref 0.40–1.20)
GFR: 119.87 mL/min (ref >60.00)
Glucose, Bld: 132 mg/dL — ABNORMAL HIGH (ref 70–99)
Potassium: 4.6 mEq/L (ref 3.5–5.1)
Sodium: 142 mEq/L (ref 135–145)

## 2018-09-04 LAB — URINALYSIS
Bilirubin Urine: NEGATIVE
Hgb urine dipstick: NEGATIVE
Ketones, ur: NEGATIVE
Leukocytes,Ua: NEGATIVE
Nitrite: NEGATIVE
Specific Gravity, Urine: 1.02 (ref 1.000–1.030)
Total Protein, Urine: NEGATIVE
Urine Glucose: NEGATIVE
Urobilinogen, UA: 0.2 (ref 0.0–1.0)
pH: 6.5 (ref 5.0–8.0)

## 2018-09-04 LAB — LIPID PANEL
Cholesterol: 172 mg/dL (ref 0–200)
HDL: 57.5 mg/dL (ref >39.00)
LDL Cholesterol: 99 mg/dL (ref 0–99)
NonHDL: 114.85
Total CHOL/HDL Ratio: 3
Triglycerides: 80 mg/dL (ref 0.0–149.0)
VLDL: 16 mg/dL (ref 0.0–40.0)

## 2018-09-04 LAB — CBC WITH DIFFERENTIAL/PLATELET
Basophils Absolute: 0 10*3/uL (ref 0.0–0.1)
Basophils Relative: 0.6 % (ref 0.0–3.0)
Eosinophils Absolute: 0.1 10*3/uL (ref 0.0–0.7)
Eosinophils Relative: 2.1 % (ref 0.0–5.0)
HCT: 40.2 % (ref 36.0–46.0)
Hemoglobin: 13.1 g/dL (ref 12.0–15.0)
Lymphocytes Relative: 37.2 % (ref 12.0–46.0)
Lymphs Abs: 1.9 10*3/uL (ref 0.7–4.0)
MCHC: 32.6 g/dL (ref 30.0–36.0)
MCV: 98.9 fl (ref 78.0–100.0)
Monocytes Absolute: 0.4 10*3/uL (ref 0.1–1.0)
Monocytes Relative: 8.2 % (ref 3.0–12.0)
Neutro Abs: 2.7 10*3/uL (ref 1.4–7.7)
Neutrophils Relative %: 51.9 % (ref 43.0–77.0)
Platelets: 385 10*3/uL (ref 150.0–400.0)
RBC: 4.06 Mil/uL (ref 3.87–5.11)
RDW: 14.6 % (ref 11.5–15.5)
WBC: 5.2 10*3/uL (ref 4.0–10.5)

## 2018-09-04 LAB — HEPATIC FUNCTION PANEL
ALT: 26 U/L (ref 0–35)
AST: 23 U/L (ref 0–37)
Albumin: 4.3 g/dL (ref 3.5–5.2)
Alkaline Phosphatase: 90 U/L (ref 39–117)
Bilirubin, Direct: 0.1 mg/dL (ref 0.0–0.3)
Total Bilirubin: 0.2 mg/dL (ref 0.2–1.2)
Total Protein: 7.5 g/dL (ref 6.0–8.3)

## 2018-09-04 LAB — VITAMIN B12: Vitamin B-12: 1369 pg/mL — ABNORMAL HIGH (ref 211–911)

## 2018-09-04 LAB — TSH: TSH: 1.42 u[IU]/mL (ref 0.35–4.50)

## 2018-09-04 LAB — VITAMIN D 25 HYDROXY (VIT D DEFICIENCY, FRACTURES): VITD: 56.11 ng/mL (ref 30.00–100.00)

## 2018-09-04 LAB — HEMOGLOBIN A1C: Hgb A1c MFr Bld: 7.4 % — ABNORMAL HIGH (ref 4.6–6.5)

## 2018-09-04 MED ORDER — GABAPENTIN 100 MG PO CAPS: 100.0000 mg | ORAL_CAPSULE | Freq: Four times a day (QID) | ORAL | 5 refills | Status: DC | PRN

## 2018-09-04 NOTE — Assessment & Plan Note (Addendum)
x4 extremties Gabapentin Labs Neurol ref if not better

## 2018-09-04 NOTE — Assessment & Plan Note (Signed)
Actos, Parlodel, Metformin

## 2018-09-04 NOTE — Assessment & Plan Note (Signed)
Labs

## 2018-09-04 NOTE — Assessment & Plan Note (Signed)
Aleve

## 2018-09-04 NOTE — Progress Notes (Signed)
Subjective:  Patient ID: Alexandra [name], female    DOB: 06-15-1965  Age: 53 y.o. MRN: [id number]  CC: No chief complaint on file.   HPI Alexandra [name] presents for numbness and pain in B feet and feet/legs F/u DM, HTN  Outpatient Medications Prior to Visit  Medication Sig Dispense Refill  . albuterol (VENTOLIN HFA) 108 (90 BASE) MCG/ACT inhaler Inhale 1 puff into the lungs every 4 (four) hours as needed. 3 Inhaler 3  . bromocriptine (PARLODEL) 2.5 MG tablet Take 1 tablet by mouth once daily 30 tablet 0  . budesonide-formoterol (SYMBICORT) 160-4.5 MCG/ACT inhaler Symbicort 160 mcg-4.5 mcg/actuation HFA aerosol inhaler    . Cholecalciferol 1000 UNITS tablet Take 1 tablet (1,000 Units total) by mouth daily. 100 tablet 3  . cyclobenzaprine (FLEXERIL) 10 MG tablet Take 1 tablet (10 mg total) by mouth 2 (two) times daily as needed. 90 tablet 1  . Estradiol 10 MCG TABS vaginal tablet Place 1 tablet (10 mcg total) vaginally 2 (two) times a week. 24 tablet 1  . famotidine (PEPCID) 40 MG tablet Take 1 tablet (40 mg total) by mouth daily. 90 tablet 3  . fluticasone (FLONASE) 50 MCG/ACT nasal spray 2 sprays by Nasal route daily.      . fluticasone furoate-vilanterol (BREO ELLIPTA) 100-25 MCG/INH AEPB Inhale 1 puff into the lungs 2 (two) times daily. 1 each 2  . Ginkgo Biloba 60 MG CAPS Take 2 capsules by mouth daily.    Marland Kitchen glucose blood (ONETOUCH VERIO) test strip And lancets 1/day 100 each 3  . hydrochlorothiazide (MICROZIDE) 12.5 MG capsule Take 1 capsule by mouth once daily in the morning 30 capsule 11  . ibuprofen (ADVIL,MOTRIN) 600 MG tablet TAKE 1 TABLET BY MOUTH EVERY 6 HOURS AS NEEDED 90 tablet 0  . loratadine (CLARITIN) 10 MG tablet TAKE 1 TABLET BY MOUTH ONCE DAILY AS NEEDED FOR ALLERGIES 100 tablet 3  . medroxyPROGESTERone (PROVERA) 5 MG tablet Take one tablet a day for 5 days if no period by 11/19/17 5 tablet 2  . metFORMIN (GLUCOPHAGE) 1000 MG tablet Take 1 tablet (1,000 mg total) by mouth  2 (two) times daily with a meal. 180 tablet 3  . mometasone (ELOCON) 0.1 % cream Apply 1 application topically 2 (two) times daily. 45 g 0  . pioglitazone (ACTOS) 45 MG tablet Take 1 tablet by mouth once daily 30 tablet 11  . promethazine (PHENERGAN) 25 MG tablet Take 1 tablet (25 mg total) by mouth 2 (two) times daily as needed for nausea. 60 tablet 2  . zolpidem (AMBIEN) 10 MG tablet Take 0.5-1 tablets (5-10 mg total) by mouth at bedtime as needed for sleep. 90 tablet 1   No facility-administered medications prior to visit.     ROS: Review of Systems  Constitutional: Negative for activity change, appetite change, chills, fatigue and unexpected weight change.  HENT: Negative for congestion, mouth sores and sinus pressure.   Eyes: Negative for visual disturbance.  Respiratory: Negative for cough and chest tightness.   Gastrointestinal: Negative for abdominal pain and nausea.  Genitourinary: Negative for difficulty urinating, frequency and vaginal pain.  Musculoskeletal: Positive for arthralgias and back pain. Negative for gait problem.  Skin: Negative for pallor and rash.  Neurological: Positive for weakness and numbness. Negative for dizziness, tremors and headaches.  Psychiatric/Behavioral: Positive for sleep disturbance. Negative for confusion and suicidal ideas. The patient is not nervous/anxious.     Objective:  There were no vitals taken for this visit.  BP Readings from Last 3 Encounters:  05/22/18 (!) 143/82  05/04/18 110/78  04/30/18 138/88    Wt Readings from Last 3 Encounters:  05/04/18 166 lb (75.3 kg)  04/30/18 168 lb (76.2 kg)  04/20/18 163 lb 1.6 oz (74 kg)    Physical Exam Constitutional:      General: She is not in acute distress.    Appearance: She is well-developed.  HENT:     Head: Normocephalic.     Right Ear: External ear normal.     Left Ear: External ear normal.     Nose: Nose normal.  Eyes:     General:        Right eye: No discharge.         Left eye: No discharge.     Conjunctiva/sclera: Conjunctivae normal.     Pupils: Pupils are equal, round, and reactive to light.  Neck:     Musculoskeletal: Normal range of motion and neck supple.     Thyroid: No thyromegaly.     Vascular: No JVD.     Trachea: No tracheal deviation.  Cardiovascular:     Rate and Rhythm: Normal rate and regular rhythm.     Heart sounds: Normal heart sounds.  Pulmonary:     Effort: No respiratory distress.     Breath sounds: No stridor. No wheezing.  Abdominal:     General: Bowel sounds are normal. There is no distension.     Palpations: Abdomen is soft. There is no mass.     Tenderness: There is no abdominal tenderness. There is no guarding or rebound.  Musculoskeletal:        General: No tenderness.  Lymphadenopathy:     Cervical: No cervical adenopathy.  Skin:    Findings: No erythema or rash.  Neurological:     Cranial Nerves: No cranial nerve deficit.     Motor: Weakness present. No abnormal muscle tone.     Coordination: Coordination normal.     Deep Tendon Reflexes: Reflexes normal.  Psychiatric:        Behavior: Behavior normal.        Thought Content: Thought content normal.        Judgment: Judgment normal.   no edema MS ok  Lab Results  Component Value Date   WBC 4.5 07/16/2017   HGB 14.7 07/16/2017   HCT 45.1 07/16/2017   PLT 290 07/16/2017   GLUCOSE 90 07/16/2017   CHOL 176 01/27/2017   TRIG 42.0 01/27/2017   HDL 51.40 01/27/2017   LDLDIRECT 122.0 03/03/2008   LDLCALC 117 (H) 01/27/2017   ALT 23 07/16/2017   AST 30 07/16/2017   NA 137 07/16/2017   K 3.4 (L) 07/16/2017   CL 102 07/16/2017   CREATININE 0.61 07/16/2017   BUN 14 07/16/2017   CO2 22 07/16/2017   TSH 1.79 01/27/2017   HGBA1C 6.4 (A) 05/04/2018   MICROALBUR <0.7 01/27/2017    No results found.  Assessment & Plan:   There are no diagnoses linked to this encounter.   No orders of the defined types were placed in this encounter.    Follow-up:  No follow-ups on file.  Walker Kehr, MD

## 2018-09-04 NOTE — Assessment & Plan Note (Signed)
Doing ok.

## 2018-09-05 NOTE — Telephone Encounter (Signed)
LOV 05/04/18. Per Dr. Loanne Drilling, f/u in 4 mo. Called pt to inform about Rx and to schedule f/u appt as ordered. LVM requesting returned call.

## 2018-09-06 NOTE — Telephone Encounter (Signed)
SECOND ATTEMPT: ° °LVM requesting returned call. °

## 2018-09-07 NOTE — Telephone Encounter (Signed)
Returned pt call and informed about Rx. Advised this change was d/t recall. Recommended pt follow up with pharmacy re: if her XR Rx WAS an affected lot number for which she would need to discontinue taking. Verbalized acceptance and understanding.

## 2018-09-07 NOTE — Telephone Encounter (Signed)
Patient request a call back due to the message below. Unsure of the information given.  Please Advise, Thanks

## 2018-09-13 ENCOUNTER — Other Ambulatory Visit: Payer: Self-pay

## 2018-09-16 ENCOUNTER — Other Ambulatory Visit: Payer: Self-pay | Admitting: Endocrinology

## 2018-09-17 ENCOUNTER — Ambulatory Visit (INDEPENDENT_AMBULATORY_CARE_PROVIDER_SITE_OTHER): Payer: Commercial Managed Care - PPO | Admitting: Endocrinology

## 2018-09-17 ENCOUNTER — Encounter: Payer: Self-pay | Admitting: Endocrinology

## 2018-09-17 ENCOUNTER — Other Ambulatory Visit: Payer: Self-pay

## 2018-09-17 VITALS — BP 122/78 | HR 88 | Ht 68.0 in | Wt 172.0 lb

## 2018-09-17 DIAGNOSIS — E119 Type 2 diabetes mellitus without complications: Secondary | ICD-10-CM | POA: Diagnosis not present

## 2018-09-17 DIAGNOSIS — E1140 Type 2 diabetes mellitus with diabetic neuropathy, unspecified: Secondary | ICD-10-CM

## 2018-09-17 LAB — POCT GLYCOSYLATED HEMOGLOBIN (HGB A1C): Hemoglobin A1C: 6.5 % — AB (ref 4.0–5.6)

## 2018-09-17 NOTE — Progress Notes (Signed)
Subjective:    Patient ID: Alexandra [name], female    DOB: 1966-03-02, 53 y.o.   MRN: [id number]  HPI Pt returns for f/u of diabetes mellitus: DM type: 2 (she is presumed to be evolving type 1, based on lean body habitus, and mother's h/o type 1).   Dx'ed: 8563 Complications: none Therapy: 3 oral meds.  GDM: never DKA: never Severe hypoglycemia: never Pancreatitis: never Other: she has never been on insulin, but she has learned how; She has a high-deductible plan, on which she cannot afford brand-name meds.  Interval history: pt says cbg meter does not work.  pt states she feels well in general.  She takes meds as rx'ed.   Past Medical History:  Diagnosis Date  . ALLERGIC RHINITIS   . Allergy   . Asthma   . CTS (carpal tunnel syndrome)   . Diabetes mellitus   . Fibroids    uterine  . Glaucoma    ?  Marland Kitchen Hx of adenomatous colonic polyps 12/15/2017  . Hypertension   . Low back pain   . Osteoarthritis   . Polyarthralgia   . Vitamin B12 deficiency     Past Surgical History:  Procedure Laterality Date  . BACK SURGERY     2011  . cervical bx      Social History   Socioeconomic History  . Marital status: Single    Spouse name: Not on file  . Number of children: Not on file  . Years of education: Not on file  . Highest education level: Not on file  Occupational History  . Occupation: WORKS SECOND SHIFT    Employer: [employer]    Comment: Public house manager  Social Needs  . Financial resource strain: Not on file  . Food insecurity    Worry: Not on file    Inability: Not on file  . Transportation needs    Medical: Not on file    Non-medical: Not on file  Tobacco Use  . Smoking status: Never Smoker  . Smokeless tobacco: Never Used  Substance and Sexual Activity  . Alcohol use: Yes    Comment: occasional wine  . Drug use: No  . Sexual activity: Not Currently    Birth control/protection: None    Comment: not sexually active at this time 11-15-17  Lifestyle  .  Physical activity    Days per week: Not on file    Minutes per session: Not on file  . Stress: Not on file  Relationships  . Social Herbalist on phone: Not on file    Gets together: Not on file    Attends religious service: Not on file    Active member of club or organization: Not on file    Attends meetings of clubs or organizations: Not on file    Relationship status: Not on file  . Intimate partner violence    Fear of current or ex partner: Not on file    Emotionally abused: Not on file    Physically abused: Not on file    Forced sexual activity: Not on file  Other Topics Concern  . Not on file  Social History Narrative  . Not on file    Current Outpatient Medications on File Prior to Visit  Medication Sig Dispense Refill  . albuterol (VENTOLIN HFA) 108 (90 BASE) MCG/ACT inhaler Inhale 1 puff into the lungs every 4 (four) hours as needed. 3 Inhaler 3  . bromocriptine (PARLODEL) 2.5 MG tablet  Take 1 tablet by mouth once daily 30 tablet 0  . budesonide-formoterol (SYMBICORT) 160-4.5 MCG/ACT inhaler Symbicort 160 mcg-4.5 mcg/actuation HFA aerosol inhaler    . Cholecalciferol 1000 UNITS tablet Take 1 tablet (1,000 Units total) by mouth daily. 100 tablet 3  . cyclobenzaprine (FLEXERIL) 10 MG tablet Take 1 tablet (10 mg total) by mouth 2 (two) times daily as needed. 90 tablet 1  . Estradiol 10 MCG TABS vaginal tablet Place 1 tablet (10 mcg total) vaginally 2 (two) times a week. 24 tablet 1  . famotidine (PEPCID) 40 MG tablet Take 1 tablet (40 mg total) by mouth daily. 90 tablet 3  . fluticasone (FLONASE) 50 MCG/ACT nasal spray 2 sprays by Nasal route daily.      . fluticasone furoate-vilanterol (BREO ELLIPTA) 100-25 MCG/INH AEPB Inhale 1 puff into the lungs 2 (two) times daily. 1 each 2  . gabapentin (NEURONTIN) 100 MG capsule Take 1 capsule (100 mg total) by mouth 4 (four) times daily as needed. 120 capsule 5  . Ginkgo Biloba 60 MG CAPS Take 2 capsules by mouth daily.     Marland Kitchen glucose blood (ONETOUCH VERIO) test strip And lancets 1/day 100 each 3  . hydrochlorothiazide (MICROZIDE) 12.5 MG capsule Take 1 capsule by mouth once daily in the morning 30 capsule 11  . ibuprofen (ADVIL,MOTRIN) 600 MG tablet TAKE 1 TABLET BY MOUTH EVERY 6 HOURS AS NEEDED 90 tablet 0  . loratadine (CLARITIN) 10 MG tablet TAKE 1 TABLET BY MOUTH ONCE DAILY AS NEEDED FOR ALLERGIES 100 tablet 3  . medroxyPROGESTERone (PROVERA) 5 MG tablet Take one tablet a day for 5 days if no period by 11/19/17 5 tablet 2  . metFORMIN (GLUCOPHAGE) 1000 MG tablet Take 1 tablet (1,000 mg total) by mouth 2 (two) times daily with a meal. 180 tablet 3  . mometasone (ELOCON) 0.1 % cream Apply 1 application topically 2 (two) times daily. 45 g 0  . pioglitazone (ACTOS) 45 MG tablet Take 1 tablet by mouth once daily 30 tablet 11  . promethazine (PHENERGAN) 25 MG tablet Take 1 tablet (25 mg total) by mouth 2 (two) times daily as needed for nausea. 60 tablet 2   No current facility-administered medications on file prior to visit.     Allergies  Allergen Reactions  . Adhesive [Tape] Other (See Comments)    EKG lead adhesive caused skin break down  . Benazepril Hcl     REACTION: cough  . Tramadol Hcl     REACTION: itching    Family History  Problem Relation Age of Onset  . Diabetes Mother   . Kidney disease Mother 23       ESRD on HD  . Heart disease Mother   . Colon polyps Mother   . Cancer Father        head and neck  . Hypertension Other   . Stroke Brother 45       CVAx2 on crack  . Breast cancer Neg Hx   . Colitis Neg Hx   . Esophageal cancer Neg Hx   . Rectal cancer Neg Hx   . Stomach cancer Neg Hx     BP 122/78 (BP Location: Left Arm, Patient Position: Sitting, Cuff Size: Large)   Pulse 88   Ht 5\' 8"  (1.727 m)   Wt 172 lb (78 kg)   SpO2 95%   BMI 26.15 kg/m    Review of Systems She denies hypoglycemia.      Objective:   Physical  Exam VITAL SIGNS:  See vs page GENERAL: no distress  Pulses: dorsalis pedis intact bilat.   MSK: no deformity of the feet CV: trace bilat leg edema Skin:  no ulcer on the feet.  normal color and temp on the feet. Neuro: sensation is intact to touch on the feet.   Lab Results  Component Value Date   HGBA1C 6.5 (A) 09/17/2018   Lab Results  Component Value Date   CREATININE 0.63 09/04/2018   BUN 18 09/04/2018   NA 142 09/04/2018   K 4.6 09/04/2018   CL 102 09/04/2018   CO2 31 09/04/2018       Assessment & Plan:  Type 2 DM, poss due to pioglitazone: well-controlled. edema: new.  We'll follow.   Patient Instructions  Please continue the same medications.  Here is a new meter. Please check your blood sugar once a day.  vary the time of day when you check, between before the 3 meals, and at bedtime.  also check if you have symptoms of your blood sugar being too high or too low.  please keep a record of the readings and bring it to your next appointment here (or you can bring the meter itself).  You can write it on any piece of paper.  please call us sooner if your blood sugar goes below 70, or if you have a lot of readings over 200.   Please come back for a follow-up appointment in 3 months.

## 2018-09-17 NOTE — Patient Instructions (Addendum)
Please continue the same medications.  Here is a new meter. Please check your blood sugar once a day.  vary the time of day when you check, between before the 3 meals, and at bedtime.  also check if you have symptoms of your blood sugar being too high or too low.  please keep a record of the readings and bring it to your next appointment here (or you can bring the meter itself).  You can write it on any piece of paper.  please call us sooner if your blood sugar goes below 70, or if you have a lot of readings over 200.   Please come back for a follow-up appointment in 3 months.

## 2018-09-27 ENCOUNTER — Ambulatory Visit (INDEPENDENT_AMBULATORY_CARE_PROVIDER_SITE_OTHER): Payer: Commercial Managed Care - PPO | Admitting: Internal Medicine

## 2018-09-27 ENCOUNTER — Telehealth: Payer: Self-pay

## 2018-09-27 ENCOUNTER — Other Ambulatory Visit: Payer: Self-pay

## 2018-09-27 ENCOUNTER — Encounter: Payer: Self-pay | Admitting: Internal Medicine

## 2018-09-27 ENCOUNTER — Ambulatory Visit (INDEPENDENT_AMBULATORY_CARE_PROVIDER_SITE_OTHER)
Admission: RE | Admit: 2018-09-27 | Discharge: 2018-09-27 | Disposition: A | Discharge status: Home / Self Care | Payer: Commercial Managed Care - PPO | Source: Ambulatory Visit | Attending: Internal Medicine | Admitting: Internal Medicine

## 2018-09-27 VITALS — BP 120/80 | HR 96 | Temp 98.5°F | Ht 68.0 in | Wt 170.0 lb

## 2018-09-27 DIAGNOSIS — M5441 Lumbago with sciatica, right side: Secondary | ICD-10-CM

## 2018-09-27 DIAGNOSIS — M5442 Lumbago with sciatica, left side: Secondary | ICD-10-CM

## 2018-09-27 MED ORDER — GABAPENTIN 300 MG PO CAPS: 300.0000 mg | ORAL_CAPSULE | Freq: Three times a day (TID) | ORAL | 3 refills | Status: DC

## 2018-09-27 NOTE — Patient Instructions (Signed)
We have sent in the gabapentin 300 mg to take up to 3 times per day.  We will check the x-ray of the low back.
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
## 2018-09-27 NOTE — Telephone Encounter (Signed)
Copied from [place] [id number]. Topic: Appointment Scheduling - Scheduling Inquiry for Clinic >> Sep 27, 2018  9:23 AM Celene Kras A wrote: Reason for CRM: Pt called stating she is experiencing excruciating pain in her right leg and that the medicine she was prescribed is not working. Please advise.

## 2018-09-27 NOTE — Telephone Encounter (Signed)
Pt seen Dr. Sharlet Salina who increased her dosage of gabapentin and pt will try that first
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
## 2018-10-02 ENCOUNTER — Ambulatory Visit: Payer: Commercial Managed Care - PPO | Admitting: Internal Medicine

## 2018-10-03 ENCOUNTER — Other Ambulatory Visit: Payer: Self-pay | Admitting: Internal Medicine

## 2018-10-16 ENCOUNTER — Other Ambulatory Visit: Payer: Self-pay | Admitting: Neurological Surgery

## 2018-10-16 DIAGNOSIS — M5416 Radiculopathy, lumbar region: Secondary | ICD-10-CM

## 2018-10-22 ENCOUNTER — Ambulatory Visit: Payer: Commercial Managed Care - PPO | Admitting: Obstetrics and Gynecology

## 2018-10-30 NOTE — Progress Notes (Deleted)
GYNECOLOGY  VISIT   HPI: 53 y.o.   Single Black or African American Not Hispanic or Latino  female   G0P0000 with No LMP recorded. (Menstrual status: Other).   here for     GYNECOLOGIC HISTORY: No LMP recorded. (Menstrual status: Other). Contraception:*** Menopausal hormone therapy: ***        OB History    Gravida  0   Para  0   Term  0   Preterm  0   AB  0   Living  0     SAB  0   TAB  0   Ectopic  0   Multiple  0   Live Births  0              Patient Active Problem List   Diagnosis Date Noted  . Diabetic neuropathy ([place]) 09/04/2018  . Hx of adenomatous colonic polyps 12/15/2017  . Left foot pain 10/07/2016  . Well adult exam 01/18/2016  . DM2 (diabetes mellitus, type 2) ([place]) 05/01/2014  . Leg cramps 12/27/2013  . Wound, open, foot 09/28/2012  . Cramps, extremity 08/27/2012  . Insomnia 05/21/2012  . CTS (carpal tunnel syndrome)   . Unspecified glaucoma(365.9)   . Polyarthralgia   . Vitamin B12 deficiency   . CHEST PAIN 05/28/2009  . LUMBAR RADICULOPATHY, RIGHT 09/25/2008  . URI 03/18/2008  . BRONCHITIS, ACUTE 10/05/2007  . HYPOKALEMIA 07/06/2007  . Essential hypertension 03/11/2007  . ALLERGIC RHINITIS 03/11/2007  . Asthma 03/11/2007  . OSTEOARTHRITIS 03/08/2007  . LOW BACK PAIN 03/08/2007    Past Medical History:  Diagnosis Date  . ALLERGIC RHINITIS   . Allergy   . Asthma   . CTS (carpal tunnel syndrome)   . Diabetes mellitus   . Fibroids    uterine  . Glaucoma    ?  Marland Kitchen Hx of adenomatous colonic polyps 12/15/2017  . Hypertension   . Low back pain   . Osteoarthritis   . Polyarthralgia   . Vitamin B12 deficiency     Past Surgical History:  Procedure Laterality Date  . BACK SURGERY     2011  . cervical bx      Current Outpatient Medications  Medication Sig Dispense Refill  . albuterol (VENTOLIN HFA) 108 (90 BASE) MCG/ACT inhaler Inhale 1 puff into the lungs every 4 (four) hours as needed. 3 Inhaler 3  . bromocriptine  (PARLODEL) 2.5 MG tablet Take 1 tablet by mouth once daily 30 tablet 0  . budesonide-formoterol (SYMBICORT) 160-4.5 MCG/ACT inhaler Symbicort 160 mcg-4.5 mcg/actuation HFA aerosol inhaler    . Cholecalciferol 1000 UNITS tablet Take 1 tablet (1,000 Units total) by mouth daily. 100 tablet 3  . cyclobenzaprine (FLEXERIL) 10 MG tablet Take 1 tablet (10 mg total) by mouth 2 (two) times daily as needed. 90 tablet 1  . Estradiol 10 MCG TABS vaginal tablet Place 1 tablet (10 mcg total) vaginally 2 (two) times a week. 24 tablet 1  . famotidine (PEPCID) 40 MG tablet Take 1 tablet (40 mg total) by mouth daily. 90 tablet 3  . fluticasone (FLONASE) 50 MCG/ACT nasal spray 2 sprays by Nasal route daily.      . fluticasone furoate-vilanterol (BREO ELLIPTA) 100-25 MCG/INH AEPB Inhale 1 puff into the lungs 2 (two) times daily. 1 each 2  . gabapentin (NEURONTIN) 300 MG capsule Take 1 capsule (300 mg total) by mouth 3 (three) times daily. 90 capsule 3  . Ginkgo Biloba 60 MG CAPS Take 2 capsules by mouth  daily.    . glucose blood (ONETOUCH VERIO) test strip And lancets 1/day 100 each 3  . hydrochlorothiazide (MICROZIDE) 12.5 MG capsule Take 1 capsule by mouth once daily in the morning 30 capsule 11  . ibuprofen (ADVIL) 600 MG tablet TAKE 1 TABLET BY MOUTH EVERY 6 HOURS AS NEEDED 90 tablet 1  . loratadine (CLARITIN) 10 MG tablet TAKE 1 TABLET BY MOUTH ONCE DAILY AS NEEDED FOR ALLERGIES 100 tablet 3  . medroxyPROGESTERone (PROVERA) 5 MG tablet Take one tablet a day for 5 days if no period by 11/19/17 5 tablet 2  . metFORMIN (GLUCOPHAGE) 1000 MG tablet Take 1 tablet (1,000 mg total) by mouth 2 (two) times daily with a meal. 180 tablet 3  . mometasone (ELOCON) 0.1 % cream Apply 1 application topically 2 (two) times daily. 45 g 0  . pioglitazone (ACTOS) 45 MG tablet Take 1 tablet by mouth once daily 30 tablet 11  . promethazine (PHENERGAN) 25 MG tablet Take 1 tablet (25 mg total) by mouth 2 (two) times daily as needed for  nausea. 60 tablet 2   No current facility-administered medications for this visit.      ALLERGIES: Adhesive [tape], Benazepril hcl, and Tramadol hcl  Family History  Problem Relation Age of Onset  . Diabetes Mother   . Kidney disease Mother 53       ESRD on HD  . Heart disease Mother   . Colon polyps Mother   . Cancer Father        head and neck  . Hypertension Other   . Stroke Brother 45       CVAx2 on crack  . Breast cancer Neg Hx   . Colitis Neg Hx   . Esophageal cancer Neg Hx   . Rectal cancer Neg Hx   . Stomach cancer Neg Hx     Social History   Socioeconomic History  . Marital status: Single    Spouse name: Not on file  . Number of children: Not on file  . Years of education: Not on file  . Highest education level: Not on file  Occupational History  . Occupation: WORKS SECOND SHIFT    Employer: [employer]    Comment: Public house manager  Social Needs  . Financial resource strain: Not on file  . Food insecurity    Worry: Not on file    Inability: Not on file  . Transportation needs    Medical: Not on file    Non-medical: Not on file  Tobacco Use  . Smoking status: Never Smoker  . Smokeless tobacco: Never Used  Substance and Sexual Activity  . Alcohol use: Yes    Comment: occasional wine  . Drug use: No  . Sexual activity: Not Currently    Birth control/protection: None    Comment: not sexually active at this time 11-15-17  Lifestyle  . Physical activity    Days per week: Not on file    Minutes per session: Not on file  . Stress: Not on file  Relationships  . Social Herbalist on phone: Not on file    Gets together: Not on file    Attends religious service: Not on file    Active member of club or organization: Not on file    Attends meetings of clubs or organizations: Not on file    Relationship status: Not on file  . Intimate partner violence    Fear of current or ex partner: Not  on file    Emotionally abused: Not on file     Physically abused: Not on file    Forced sexual activity: Not on file  Other Topics Concern  . Not on file  Social History Narrative  . Not on file    ROS  PHYSICAL EXAMINATION:    There were no vitals taken for this visit.    General appearance: alert, cooperative and appears stated age Neck: no adenopathy, supple, symmetrical, trachea midline and thyroid {CHL AMB PHY EX THYROID NORM DEFAULT:[phone]::"normal to inspection and palpation"} Breasts: {Exam; breast:13139::"normal appearance, no masses or tenderness"} Abdomen: soft, non-tender; non distended, no masses,  no organomegaly  Pelvic: External genitalia:  no lesions              Urethra:  normal appearing urethra with no masses, tenderness or lesions              Bartholins and Skenes: normal                 Vagina: normal appearing vagina with normal color and discharge, no lesions              Cervix: {CHL AMB PHY EX CERVIX NORM DEFAULT:[id number]::"no lesions"}              Bimanual Exam:  Uterus:  {CHL AMB PHY EX UTERUS NORM DEFAULT:[phone]::"normal size, contour, position, consistency, mobility, non-tender"}              Adnexa: {CHL AMB PHY EX ADNEXA NO MASS [phone]::"no mass, fullness, tenderness"}              Rectovaginal: {yes no:[id number]}.  Confirms.              Anus:  normal sphincter tone, no lesions  Chaperone was present for exam.  ASSESSMENT     PLAN    An After Visit Summary was printed and given to the patient.  *** minutes face to face time of which over 50% was spent in counseling.

## 2018-10-31 ENCOUNTER — Other Ambulatory Visit: Payer: Self-pay

## 2018-11-02 ENCOUNTER — Ambulatory Visit: Payer: Commercial Managed Care - PPO | Admitting: Obstetrics and Gynecology

## 2018-11-06 ENCOUNTER — Ambulatory Visit: Payer: Commercial Managed Care - PPO | Admitting: Obstetrics and Gynecology

## 2018-11-06 ENCOUNTER — Other Ambulatory Visit: Payer: Self-pay

## 2018-11-06 NOTE — Progress Notes (Signed)
53 y.o. G0P0000 Single Black or African American Not Hispanic or Latino female here for annual exam.   She was sexually active in May, some discomfort and spotted after. No other vaginal bleeding.   One of her brothers died in 08/07/22 of pancreatic cancer. She has one other brother.   She was treated for a UTI a few month ago, had blood in her urine at the time.   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 Last pap in 1/20 returned as normal but + HPV.    No LMP recorded. Patient is postmenopausal.          Sexually active: No.  The current method of family planning is none.    Exercising: No.  The patient does not participate in regular exercise at present. Smoker:  no  Health Maintenance: Pap: 09/20/2017 ASCH with HPV, 10/29/2015 WNL per patient History of abnormal Pap:  No MMG:  10/11/2017 Birads 1 negative Colonoscopy: 11/29/2017  TDaP:  09/28/2012 Gardasil: N/A   reports that she has never smoked. She has never used smokeless tobacco. She reports current alcohol use. She reports that she does not use drugs. Rare ETOH. 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  Past Medical History:  Diagnosis Date  . ALLERGIC RHINITIS   . Allergy   . Asthma   . CTS (carpal tunnel syndrome)   . Diabetes mellitus   . Fibroids    uterine  . Glaucoma    ?  Marland Kitchen Hx of adenomatous colonic polyps 12/15/2017  . Hypertension   . Low back pain   . Osteoarthritis   . Polyarthralgia   . Vitamin B12 deficiency     Past Surgical History:  Procedure Laterality Date  . BACK SURGERY     2011  . cervical bx      Current Outpatient Medications  Medication Sig Dispense Refill  . albuterol (VENTOLIN HFA) 108 (90 BASE) MCG/ACT inhaler Inhale 1 puff into the lungs every 4 (four) hours as needed. 3 Inhaler 3  . bromocriptine (PARLODEL) 2.5 MG tablet Take 1 tablet by mouth  once daily 30 tablet 0  . budesonide-formoterol (SYMBICORT) 160-4.5 MCG/ACT inhaler Symbicort 160 mcg-4.5 mcg/actuation HFA aerosol inhaler    . Cholecalciferol 1000 UNITS tablet Take 1 tablet (1,000 Units total) by mouth daily. 100 tablet 3  . cyclobenzaprine (FLEXERIL) 10 MG tablet Take 1 tablet (10 mg total) by mouth 2 (two) times daily as needed. 90 tablet 1  . famotidine (PEPCID) 40 MG tablet Take 1 tablet (40 mg total) by mouth daily. 90 tablet 3  . fluticasone (FLONASE) 50 MCG/ACT nasal spray 2 sprays by Nasal route daily.      . fluticasone furoate-vilanterol (BREO ELLIPTA) 100-25 MCG/INH AEPB Inhale 1 puff into the lungs 2 (two) times daily. 1 each 2  . gabapentin (NEURONTIN) 300 MG capsule Take 1 capsule (300 mg total) by mouth 3 (three) times daily. 90 capsule 3  . Ginkgo Biloba 60 MG CAPS Take 2 capsules by mouth daily.    Marland Kitchen glucose blood (ONETOUCH VERIO) test strip And lancets 1/day 100 each 3  . hydrochlorothiazide (MICROZIDE) 12.5 MG capsule Take 1 capsule by mouth once daily in the morning 30 capsule 11  . ibuprofen (ADVIL) 600 MG tablet TAKE 1 TABLET BY MOUTH EVERY 6 HOURS AS NEEDED 90 tablet 1  . loratadine (CLARITIN)  10 MG tablet TAKE 1 TABLET BY MOUTH ONCE DAILY AS NEEDED FOR ALLERGIES 100 tablet 3  . metFORMIN (GLUCOPHAGE) 1000 MG tablet Take 1 tablet (1,000 mg total) by mouth 2 (two) times daily with a meal. 180 tablet 3  . mometasone (ELOCON) 0.1 % cream Apply 1 application topically 2 (two) times daily. 45 g 0  . pioglitazone (ACTOS) 45 MG tablet Take 1 tablet by mouth once daily 30 tablet 11  . promethazine (PHENERGAN) 25 MG tablet Take 1 tablet (25 mg total) by mouth 2 (two) times daily as needed for nausea. 60 tablet 2   No current facility-administered medications for this visit.     Family History  Problem Relation Age of Onset  . Diabetes Mother   . Kidney disease Mother 69       ESRD on HD  . Heart disease Mother   . Colon polyps Mother   . Cancer Father         head and neck  . Hypertension Other   . Stroke Brother 45       CVAx2 on crack  . Breast cancer Neg Hx   . Colitis Neg Hx   . Esophageal cancer Neg Hx   . Rectal cancer Neg Hx   . Stomach cancer Neg Hx     Review of Systems  Constitutional: Negative.   HENT: Negative.   Eyes: Negative.   Respiratory: Negative.   Cardiovascular: Negative.   Gastrointestinal: Negative.   Endocrine: Negative.   Genitourinary: Negative.   Musculoskeletal: Negative.   Skin: Negative.   Allergic/Immunologic: Negative.   Neurological: Negative.   Hematological: Negative.   Psychiatric/Behavioral: Negative.     Exam:   BP 120/80 (BP Location: Right Arm, Patient Position: Sitting, Cuff Size: Normal)   Pulse 88   Temp (!) 97.4 F (36.3 C) (Skin)   Wt 168 lb (76.2 kg)   BMI 25.54 kg/m   Weight change: @WEIGHTCHANGE @ Height:      Ht Readings from Last 3 Encounters:  09/27/18 5\' 8"  (1.727 m)  09/17/18 5\' 8"  (1.727 m)  09/04/18 5\' 8"  (1.727 m)    General appearance: alert, cooperative and appears stated age Head: Normocephalic, without obvious abnormality, atraumatic Neck: no adenopathy, supple, symmetrical, trachea midline and thyroid normal to inspection and palpation Lungs: clear to auscultation bilaterally Cardiovascular: regular rate and rhythm Breasts: normal appearance, no masses or tenderness Abdomen: soft, non-tender; non distended,  no masses,  no organomegaly Extremities: extremities normal, atraumatic, no cyanosis or edema Skin: Skin color, texture, turgor normal. No rashes or lesions Lymph nodes: Cervical, supraclavicular, and axillary nodes normal. No abnormal inguinal nodes palpated Neurologic: Grossly normal   Pelvic: External genitalia:  no lesions              Urethra:  normal appearing urethra with no masses, tenderness or lesions              Bartholins and Skenes: normal                 Vagina: atrophic appearing vagina with normal color and discharge, no  lesions              Cervix: no lesions, deviated from the patients left side, mildly friable with the pap (secondary to atrophy)               Bimanual Exam:  Uterus:  enlarged, 10-12 week sized, decreased mobility, felt best on RV exam. Stable  Adnexa: no mass, fullness, tenderness               Rectovaginal: Confirms               Anus:  normal sphincter tone, no lesions  Chaperone was present for exam.  A:  Well Woman with normal exam  H/O VAIN I  Fibroid uterus, stable exam  Vaginal atrophy, not currently sexually active.    P:   Pap with hpv, GC/CT/Trich, declines blood work for [employer]  She will set up her mammogram  Discussed breast self exam  Discussed calcium and vit D intake  Labs with primary  Colonoscopy UTD

## 2018-11-07 ENCOUNTER — Encounter: Payer: Self-pay | Admitting: Obstetrics and Gynecology

## 2018-11-07 ENCOUNTER — Other Ambulatory Visit (HOSPITAL_COMMUNITY)
Admission: RE | Admit: 2018-11-07 | Discharge: 2018-11-07 | Disposition: A | Discharge status: Home / Self Care | Payer: Commercial Managed Care - PPO | Source: Ambulatory Visit | Attending: Obstetrics and Gynecology | Admitting: Obstetrics and Gynecology

## 2018-11-07 ENCOUNTER — Ambulatory Visit (INDEPENDENT_AMBULATORY_CARE_PROVIDER_SITE_OTHER): Payer: Commercial Managed Care - PPO | Admitting: Obstetrics and Gynecology

## 2018-11-07 VITALS — BP 120/80 | HR 88 | Temp 97.4°F | Wt 168.0 lb

## 2018-11-07 DIAGNOSIS — N890 Mild vaginal dysplasia: Secondary | ICD-10-CM | POA: Diagnosis not present

## 2018-11-07 DIAGNOSIS — D259 Leiomyoma of uterus, unspecified: Secondary | ICD-10-CM

## 2018-11-07 DIAGNOSIS — Z1272 Encounter for screening for malignant neoplasm of vagina: Secondary | ICD-10-CM

## 2018-11-07 DIAGNOSIS — N952 Postmenopausal atrophic vaginitis: Secondary | ICD-10-CM

## 2018-11-07 DIAGNOSIS — Z01419 Encounter for gynecological examination (general) (routine) without abnormal findings: Secondary | ICD-10-CM | POA: Diagnosis not present

## 2018-11-07 DIAGNOSIS — Z124 Encounter for screening for malignant neoplasm of cervix: Secondary | ICD-10-CM | POA: Diagnosis not present

## 2018-11-07 DIAGNOSIS — Z113 Encounter for screening for infections with a predominantly sexual mode of transmission: Secondary | ICD-10-CM

## 2018-11-07 NOTE — Patient Instructions (Signed)
EXERCISE AND DIET:  We recommended that you start or continue a regular exercise program for good health. Regular exercise means any activity that makes your heart beat faster and makes you sweat.  We recommend exercising at least 30 minutes per day at least 3 days a week, preferably 4 or 5.  We also recommend a diet low in fat and sugar.  Inactivity, poor dietary choices and obesity can cause diabetes, heart attack, stroke, and kidney damage, among others.    ALCOHOL AND SMOKING:  Women should limit their alcohol intake to no more than 7 drinks/beers/glasses of wine (combined, not each!) per week. Moderation of alcohol intake to this level decreases your risk of breast cancer and liver damage. And of course, no recreational drugs are part of a healthy lifestyle.  And absolutely no smoking or even second hand smoke. Most people know smoking can cause heart and lung diseases, but did you know it also contributes to weakening of your bones? Aging of your skin?  Yellowing of your teeth and nails?  CALCIUM AND VITAMIN D:  Adequate intake of calcium and Vitamin D are recommended.  The recommendations for exact amounts of these supplements seem to change often, but generally speaking 1,200 mg of calcium (between diet and supplement) and 800 units of Vitamin D per day seems prudent. Certain women may benefit from higher intake of Vitamin D.  If you are among these women, your doctor will have told you during your visit.    PAP SMEARS:  Pap smears, to check for cervical cancer or precancers,  have traditionally been done yearly, although recent scientific advances have shown that most women can have pap smears less often.  However, every woman still should have a physical exam from her gynecologist every year. It will include a breast check, inspection of the vulva and vagina to check for abnormal growths or skin changes, a visual exam of the cervix, and then an exam to evaluate the size and shape of the uterus and  ovaries.  And after 52 years of age, a rectal exam is indicated to check for rectal cancers. We will also provide age appropriate advice regarding health maintenance, like when you should have certain vaccines, screening for sexually transmitted diseases, bone density testing, colonoscopy, mammograms, etc.   MAMMOGRAMS:  All women over 40 years old should have a yearly mammogram. Many facilities now offer a "3D" mammogram, which may cost around $50 extra out of pocket. If possible,  we recommend you accept the option to have the 3D mammogram performed.  It both reduces the number of women who will be called back for extra views which then turn out to be normal, and it is better than the routine mammogram at detecting truly abnormal areas.    COLON CANCER SCREENING: Now recommend starting at age 45. At this time colonoscopy is not covered for routine screening until 50. There are take home tests that can be done between 45-49.   COLONOSCOPY:  Colonoscopy to screen for colon cancer is recommended for all women at age 50.  We know, you hate the idea of the prep.  We agree, BUT, having colon cancer and not knowing it is worse!!  Colon cancer so often starts as a polyp that can be seen and removed at colonscopy, which can quite literally save your life!  And if your first colonoscopy is normal and you have no family history of colon cancer, most women don't have to have it again for   10 years.  Once every ten years, you can do something that may end up saving your life, right?  We will be happy to help you get it scheduled when you are ready.  Be sure to check your insurance coverage so you understand how much it will cost.  It may be covered as a preventative service at no cost, but you should check your particular policy.      Breast Self-Awareness Breast self-awareness means being familiar with how your breasts look and feel. It involves checking your breasts regularly and reporting any changes to your  health care provider. Practicing breast self-awareness is important. A change in your breasts can be a sign of a serious medical problem. Being familiar with how your breasts look and feel allows you to find any problems early, when treatment is more likely to be successful. All women should practice breast self-awareness, including women who have had breast implants. How to do a breast self-exam One way to learn what is normal for your breasts and whether your breasts are changing is to do a breast self-exam. To do a breast self-exam: Look for Changes  1. Remove all the clothing above your waist. 2. Stand in front of a mirror in a room with good lighting. 3. Put your hands on your hips. 4. Push your hands firmly downward. 5. Compare your breasts in the mirror. Look for differences between them (asymmetry), such as: ? Differences in shape. ? Differences in size. ? Puckers, dips, and bumps in one breast and not the other. 6. Look at each breast for changes in your skin, such as: ? Redness. ? Scaly areas. 7. Look for changes in your nipples, such as: ? Discharge. ? Bleeding. ? Dimpling. ? Redness. ? A change in position. Feel for Changes Carefully feel your breasts for lumps and changes. It is best to do this while lying on your back on the floor and again while sitting or standing in the shower or tub with soapy water on your skin. Feel each breast in the following way:  Place the arm on the side of the breast you are examining above your head.  Feel your breast with the other hand.  Start in the nipple area and make  inch (2 cm) overlapping circles to feel your breast. Use the pads of your three middle fingers to do this. Apply light pressure, then medium pressure, then firm pressure. The light pressure will allow you to feel the tissue closest to the skin. The medium pressure will allow you to feel the tissue that is a little deeper. The firm pressure will allow you to feel the tissue  close to the ribs.  Continue the overlapping circles, moving downward over the breast until you feel your ribs below your breast.  Move one finger-width toward the center of the body. Continue to use the  inch (2 cm) overlapping circles to feel your breast as you move slowly up toward your collarbone.  Continue the up and down exam using all three pressures until you reach your armpit.  Write Down What You Find  Write down what is normal for each breast and any changes that you find. Keep a written record with breast changes or normal findings for each breast. By writing this information down, you do not need to depend only on memory for size, tenderness, or location. Write down where you are in your menstrual cycle, if you are still menstruating. If you are having trouble noticing differences   in your breasts, do not get discouraged. With time you will become more familiar with the variations in your breasts and more comfortable with the exam. How often should I examine my breasts? Examine your breasts every month. If you are breastfeeding, the best time to examine your breasts is after a feeding or after using a breast pump. If you menstruate, the best time to examine your breasts is 5-7 days after your period is over. During your period, your breasts are lumpier, and it may be more difficult to notice changes. When should I see my health care provider? See your health care provider if you notice:  A change in shape or size of your breasts or nipples.  A change in the skin of your breast or nipples, such as a reddened or scaly area.  Unusual discharge from your nipples.  A lump or thick area that was not there before.  Pain in your breasts.  Anything that concerns you.  

## 2018-11-09 LAB — CYTOLOGY - PAP
Chlamydia: NEGATIVE
HPV: DETECTED — AB
Neisseria Gonorrhea: NEGATIVE
Trichomonas: NEGATIVE

## 2018-11-12 ENCOUNTER — Telehealth: Payer: Self-pay

## 2018-11-12 ENCOUNTER — Other Ambulatory Visit: Payer: Self-pay | Admitting: Endocrinology

## 2018-11-12 ENCOUNTER — Ambulatory Visit
Admission: RE | Admit: 2018-11-12 | Discharge: 2018-11-12 | Disposition: A | Discharge status: Home / Self Care | Payer: Commercial Managed Care - PPO | Source: Ambulatory Visit | Attending: Neurological Surgery | Admitting: Neurological Surgery

## 2018-11-12 ENCOUNTER — Other Ambulatory Visit: Payer: Self-pay

## 2018-11-12 DIAGNOSIS — R87610 Atypical squamous cells of undetermined significance on cytologic smear of cervix (ASC-US): Secondary | ICD-10-CM

## 2018-11-12 DIAGNOSIS — M5416 Radiculopathy, lumbar region: Secondary | ICD-10-CM

## 2018-11-12 DIAGNOSIS — B977 Papillomavirus as the cause of diseases classified elsewhere: Secondary | ICD-10-CM

## 2018-11-12 MED ORDER — GADOBENATE DIMEGLUMINE 529 MG/ML IV SOLN
15.0000 mL | Freq: Once | INTRAVENOUS | Status: AC | PRN
  Administered 2018-11-12: 15 mL via INTRAVENOUS

## 2018-11-12 NOTE — Telephone Encounter (Signed)
Spoke with patient. Results given. Patient verbalizes understanding. Appointment scheduled for 12/06/2018 at 11:30 am. Patient is agreeable to date and time.   Instructions given. Motrin 800 mg po x , one hour before appointment with food. Make sure to eat a meal before appointment and drink plenty of fluids. Patient verbalized understanding and will call to reschedule if will be on menses or has any concerns regarding pregnancy. Patient agreeable and verbalized understanding of all instructions. Order placed. Encounter closed.

## 2018-11-12 NOTE — Telephone Encounter (Signed)
-----   Message from Salvadore Dom, MD sent at 11/12/2018 12:03 PM EDT ----- Please inform and set her up for another colposcopy.

## 2018-11-13 ENCOUNTER — Telehealth: Payer: Self-pay | Admitting: Obstetrics and Gynecology

## 2018-11-13 NOTE — Telephone Encounter (Signed)
Call placed to convey benefits for colposcopy. °

## 2018-11-13 NOTE — Telephone Encounter (Signed)
Patient is returning a call to [name]. °

## 2018-11-20 ENCOUNTER — Other Ambulatory Visit: Payer: Self-pay | Admitting: Internal Medicine

## 2018-11-20 DIAGNOSIS — Z1231 Encounter for screening mammogram for malignant neoplasm of breast: Secondary | ICD-10-CM

## 2018-12-03 NOTE — Progress Notes (Signed)
GYNECOLOGY  VISIT   HPI: 53 y.o.   Single Black or African American Not Hispanic or Latino  female   [id number] with No LMP recorded. Patient is postmenopausal.   here for evaluation of an ASCUS, +HPV pap.   Patient has taken 400mg  of Ibuprofen as well as the pain medicine for her back.  Her pap from 7/19 returned as ASC-H, +HPV, Colposcopy was unsatisfactory, ECC negative, 2 vaginal biopsies returned with VAIN I.Next pap in 1/20 returned as normal but + HPV  Patient would like a refill of the Triamcinolone ointment, she uses is on occasion for vulvar itching. No current symptoms. She has tried Vaseline in the past without relief.  She is seeing a specialist, it has been recommended that she have back surgery.    GYNECOLOGIC HISTORY: No LMP recorded. Patient is postmenopausal. Contraception:Postmenopausal Menopausal hormone therapy: none        OB History    Gravida  0   Para  0   Term  0   Preterm  0   AB  0   Living  0     SAB  0   TAB  0   Ectopic  0   Multiple  0   Live Births  0              Patient Active Problem List   Diagnosis Date Noted  . Diabetic neuropathy ([place]) 09/04/2018  . Hx of adenomatous colonic polyps 12/15/2017  . Left foot pain 10/07/2016  . Well adult exam 01/18/2016  . DM2 (diabetes mellitus, type 2) ([place]) 05/01/2014  . Leg cramps 12/27/2013  . Wound, open, foot 09/28/2012  . Cramps, extremity 08/27/2012  . Insomnia 05/21/2012  . CTS (carpal tunnel syndrome)   . Unspecified glaucoma(365.9)   . Polyarthralgia   . Vitamin B12 deficiency   . CHEST PAIN 05/28/2009  . LUMBAR RADICULOPATHY, RIGHT 09/25/2008  . URI 03/18/2008  . BRONCHITIS, ACUTE 10/05/2007  . HYPOKALEMIA 07/06/2007  . Essential hypertension 03/11/2007  . ALLERGIC RHINITIS 03/11/2007  . Asthma 03/11/2007  . OSTEOARTHRITIS 03/08/2007  . LOW BACK PAIN 03/08/2007    Past Medical History:  Diagnosis Date  . ALLERGIC RHINITIS   . Allergy   . Asthma   . CTS  (carpal tunnel syndrome)   . Diabetes mellitus   . Fibroids    uterine  . Glaucoma    ?  Marland Kitchen Hx of adenomatous colonic polyps 12/15/2017  . Hypertension   . Low back pain   . Osteoarthritis   . Polyarthralgia   . Vitamin B12 deficiency     Past Surgical History:  Procedure Laterality Date  . BACK SURGERY     2011  . cervical bx      Current Outpatient Medications  Medication Sig Dispense Refill  . albuterol (VENTOLIN HFA) 108 (90 BASE) MCG/ACT inhaler Inhale 1 puff into the lungs every 4 (four) hours as needed. 3 Inhaler 3  . bromocriptine (PARLODEL) 2.5 MG tablet Take 1 tablet by mouth once daily 30 tablet 0  . budesonide-formoterol (SYMBICORT) 160-4.5 MCG/ACT inhaler Symbicort 160 mcg-4.5 mcg/actuation HFA aerosol inhaler    . Cholecalciferol 1000 UNITS tablet Take 1 tablet (1,000 Units total) by mouth daily. 100 tablet 3  . cyclobenzaprine (FLEXERIL) 10 MG tablet Take 1 tablet (10 mg total) by mouth 2 (two) times daily as needed. 90 tablet 1  . famotidine (PEPCID) 40 MG tablet Take 1 tablet (40 mg total) by mouth daily. 90 tablet 3  .  fluticasone (FLONASE) 50 MCG/ACT nasal spray 2 sprays by Nasal route daily.      . fluticasone furoate-vilanterol (BREO ELLIPTA) 100-25 MCG/INH AEPB Inhale 1 puff into the lungs 2 (two) times daily. 1 each 2  . gabapentin (NEURONTIN) 300 MG capsule Take 1 capsule (300 mg total) by mouth 3 (three) times daily. 90 capsule 3  . Ginkgo Biloba 60 MG CAPS Take 2 capsules by mouth daily.    Marland Kitchen glucose blood (ONETOUCH VERIO) test strip And lancets 1/day 100 each 3  . hydrochlorothiazide (MICROZIDE) 12.5 MG capsule Take 1 capsule by mouth once daily in the morning 30 capsule 11  . ibuprofen (ADVIL) 600 MG tablet TAKE 1 TABLET BY MOUTH EVERY 6 HOURS AS NEEDED 90 tablet 1  . loratadine (CLARITIN) 10 MG tablet TAKE 1 TABLET BY MOUTH ONCE DAILY AS NEEDED FOR ALLERGIES 100 tablet 3  . metFORMIN (GLUCOPHAGE) 1000 MG tablet Take 1 tablet (1,000 mg total) by mouth 2  (two) times daily with a meal. 180 tablet 3  . mometasone (ELOCON) 0.1 % cream Apply 1 application topically 2 (two) times daily. 45 g 0  . pioglitazone (ACTOS) 45 MG tablet Take 1 tablet by mouth once daily 30 tablet 11  . promethazine (PHENERGAN) 25 MG tablet Take 1 tablet (25 mg total) by mouth 2 (two) times daily as needed for nausea. 60 tablet 2   No current facility-administered medications for this visit.      ALLERGIES: Adhesive [tape], Benazepril hcl, and Tramadol hcl  Family History  Problem Relation Age of Onset  . Diabetes Mother   . Kidney disease Mother 42       ESRD on HD  . Heart disease Mother   . Colon polyps Mother   . Cancer Father        head and neck  . Hypertension Other   . Stroke Brother 45       CVAx2 on crack  . Breast cancer Neg Hx   . Colitis Neg Hx   . Esophageal cancer Neg Hx   . Rectal cancer Neg Hx   . Stomach cancer Neg Hx     Social History   Socioeconomic History  . Marital status: Single    Spouse name: Not on file  . Number of children: Not on file  . Years of education: Not on file  . Highest education level: Not on file  Occupational History  . Occupation: WORKS SECOND SHIFT    Employer: [employer]    Comment: Public house manager  Social Needs  . Financial resource strain: Not on file  . Food insecurity    Worry: Not on file    Inability: Not on file  . Transportation needs    Medical: Not on file    Non-medical: Not on file  Tobacco Use  . Smoking status: Never Smoker  . Smokeless tobacco: Never Used  Substance and Sexual Activity  . Alcohol use: Yes    Comment: occasional wine  . Drug use: No  . Sexual activity: Not Currently    Birth control/protection: None  Lifestyle  . Physical activity    Days per week: Not on file    Minutes per session: Not on file  . Stress: Not on file  Relationships  . Social Herbalist on phone: Not on file    Gets together: Not on file    Attends religious service: Not  on file    Active member of club or  organization: Not on file    Attends meetings of clubs or organizations: Not on file    Relationship status: Not on file  . Intimate partner violence    Fear of current or ex partner: Not on file    Emotionally abused: Not on file    Physically abused: Not on file    Forced sexual activity: Not on file  Other Topics Concern  . Not on file  Social History Narrative  . Not on file    Review of Systems  Constitutional: Negative.   HENT: Negative.   Eyes: Negative.   Respiratory: Negative.   Cardiovascular: Negative.   Gastrointestinal: Negative.   Genitourinary: Negative.   Musculoskeletal: Negative.   Skin: Negative.   Neurological: Negative.   Endo/Heme/Allergies: Negative.   Psychiatric/Behavioral: Negative.     PHYSICAL EXAMINATION:    There were no vitals taken for this visit.    General appearance: alert, cooperative and appears stated age  Pelvic: External genitalia:  no lesions              Urethra:  normal appearing urethra with no masses, tenderness or lesions              Bartholins and Skenes: normal                 Vagina: normal appearing vagina with normal color and discharge, no lesions              Cervix: no lesions                Colposcopy: Unsatisfactory. Mild acetowhite changes at 2 o'clock, biopsy done. ECC done. Some decrease lugols uptake bilaterally in the vagina, vaginal biopsies at 3 and 10 o'clock. Biopsy sites treated with silver nitrated.  Chaperone was present for exam.  ASSESSMENT ASCUS, +HPV pap, prior h/o VAIN I Intermittent vulvar pruritis, no current symptoms. Uses steroid ointment prn    PLAN Colposcopy with cervical and vaginal biopsies and ECC Further plans depending on the results Steroid ointment for occasional use, aware not for daily use   An After Visit Summary was printed and given to the patient.

## 2018-12-04 ENCOUNTER — Other Ambulatory Visit: Payer: Self-pay

## 2018-12-05 ENCOUNTER — Ambulatory Visit (INDEPENDENT_AMBULATORY_CARE_PROVIDER_SITE_OTHER): Payer: Commercial Managed Care - PPO | Admitting: Internal Medicine

## 2018-12-05 ENCOUNTER — Other Ambulatory Visit: Payer: Self-pay

## 2018-12-05 ENCOUNTER — Encounter: Payer: Self-pay | Admitting: Internal Medicine

## 2018-12-05 VITALS — BP 124/80 | HR 79 | Temp 98.0°F | Ht 68.0 in | Wt 170.0 lb

## 2018-12-05 DIAGNOSIS — Z23 Encounter for immunization: Secondary | ICD-10-CM

## 2018-12-05 DIAGNOSIS — M5440 Lumbago with sciatica, unspecified side: Secondary | ICD-10-CM | POA: Diagnosis not present

## 2018-12-05 DIAGNOSIS — E1140 Type 2 diabetes mellitus with diabetic neuropathy, unspecified: Secondary | ICD-10-CM

## 2018-12-05 DIAGNOSIS — I10 Essential (primary) hypertension: Secondary | ICD-10-CM | POA: Diagnosis not present

## 2018-12-05 DIAGNOSIS — E538 Deficiency of other specified B group vitamins: Secondary | ICD-10-CM | POA: Diagnosis not present

## 2018-12-05 MED ORDER — ALBUTEROL SULFATE HFA 108 (90 BASE) MCG/ACT IN AERS: 1.0000 | INHALATION_SPRAY | RESPIRATORY_TRACT | 3 refills | Status: DC | PRN

## 2018-12-05 MED ORDER — IBUPROFEN 600 MG PO TABS: 600.0000 mg | ORAL_TABLET | Freq: Four times a day (QID) | ORAL | 1 refills | Status: DC | PRN

## 2018-12-05 MED ORDER — LORATADINE 10 MG PO TABS: ORAL_TABLET | ORAL | 3 refills | Status: DC

## 2018-12-05 MED ORDER — METFORMIN HCL 1000 MG PO TABS: 1000.0000 mg | ORAL_TABLET | Freq: Two times a day (BID) | ORAL | 3 refills | Status: DC

## 2018-12-05 MED ORDER — GABAPENTIN 300 MG PO CAPS: 300.0000 mg | ORAL_CAPSULE | Freq: Three times a day (TID) | ORAL | 3 refills | Status: DC

## 2018-12-05 MED ORDER — BREO ELLIPTA 100-25 MCG/INH IN AEPB: 1.0000 | INHALATION_SPRAY | Freq: Two times a day (BID) | RESPIRATORY_TRACT | 3 refills | Status: DC

## 2018-12-05 MED ORDER — BROMOCRIPTINE MESYLATE 2.5 MG PO TABS: 2.5000 mg | ORAL_TABLET | Freq: Every day | ORAL | 3 refills | Status: DC

## 2018-12-05 MED ORDER — FAMOTIDINE 40 MG PO TABS: 40.0000 mg | ORAL_TABLET | Freq: Every day | ORAL | 3 refills | Status: DC

## 2018-12-05 MED ORDER — CYCLOBENZAPRINE HCL 10 MG PO TABS: 10.0000 mg | ORAL_TABLET | Freq: Two times a day (BID) | ORAL | 1 refills | Status: DC | PRN

## 2018-12-05 MED ORDER — HYDROCHLOROTHIAZIDE 12.5 MG PO CAPS: ORAL_CAPSULE | ORAL | 3 refills | Status: DC

## 2018-12-05 MED ORDER — PIOGLITAZONE HCL 45 MG PO TABS: 45.0000 mg | ORAL_TABLET | Freq: Every day | ORAL | 3 refills | Status: DC

## 2018-12-05 NOTE — Assessment & Plan Note (Signed)
HCTZ 

## 2018-12-05 NOTE — Assessment & Plan Note (Addendum)
F/u w/Dr Loanne Drilling Actos, Parlodel, Metformin

## 2018-12-05 NOTE — Assessment & Plan Note (Signed)
MRI w/L4-5 spondylosis F/u w/NS

## 2018-12-05 NOTE — Progress Notes (Signed)
Subjective:  Patient ID: Alexandra [name], female    DOB: 1965-11-30  Age: 53 y.o. MRN: [id number]  CC: No chief complaint on file.   HPI Alexandra [name] presents for LBP, DM, asthma f/u  Outpatient Medications Prior to Visit  Medication Sig Dispense Refill  . albuterol (VENTOLIN HFA) 108 (90 BASE) MCG/ACT inhaler Inhale 1 puff into the lungs every 4 (four) hours as needed. 3 Inhaler 3  . bromocriptine (PARLODEL) 2.5 MG tablet Take 1 tablet by mouth once daily 30 tablet 0  . budesonide-formoterol (SYMBICORT) 160-4.5 MCG/ACT inhaler Symbicort 160 mcg-4.5 mcg/actuation HFA aerosol inhaler    . Cholecalciferol 1000 UNITS tablet Take 1 tablet (1,000 Units total) by mouth daily. 100 tablet 3  . cyclobenzaprine (FLEXERIL) 10 MG tablet Take 1 tablet (10 mg total) by mouth 2 (two) times daily as needed. 90 tablet 1  . famotidine (PEPCID) 40 MG tablet Take 1 tablet (40 mg total) by mouth daily. 90 tablet 3  . fluticasone (FLONASE) 50 MCG/ACT nasal spray 2 sprays by Nasal route daily.      . fluticasone furoate-vilanterol (BREO ELLIPTA) 100-25 MCG/INH AEPB Inhale 1 puff into the lungs 2 (two) times daily. 1 each 2  . gabapentin (NEURONTIN) 300 MG capsule Take 1 capsule (300 mg total) by mouth 3 (three) times daily. 90 capsule 3  . Ginkgo Biloba 60 MG CAPS Take 2 capsules by mouth daily.    Marland Kitchen glucose blood (ONETOUCH VERIO) test strip And lancets 1/day 100 each 3  . hydrochlorothiazide (MICROZIDE) 12.5 MG capsule Take 1 capsule by mouth once daily in the morning 30 capsule 11  . ibuprofen (ADVIL) 600 MG tablet TAKE 1 TABLET BY MOUTH EVERY 6 HOURS AS NEEDED 90 tablet 1  . loratadine (CLARITIN) 10 MG tablet TAKE 1 TABLET BY MOUTH ONCE DAILY AS NEEDED FOR ALLERGIES 100 tablet 3  . metFORMIN (GLUCOPHAGE) 1000 MG tablet Take 1 tablet (1,000 mg total) by mouth 2 (two) times daily with a meal. 180 tablet 3  . mometasone (ELOCON) 0.1 % cream Apply 1 application topically 2 (two) times daily. 45 g 0  .  pioglitazone (ACTOS) 45 MG tablet Take 1 tablet by mouth once daily 30 tablet 11  . promethazine (PHENERGAN) 25 MG tablet Take 1 tablet (25 mg total) by mouth 2 (two) times daily as needed for nausea. 60 tablet 2   No facility-administered medications prior to visit.     ROS: Review of Systems  Constitutional: Negative for activity change, appetite change, chills, fatigue and unexpected weight change.  HENT: Negative for congestion, mouth sores and sinus pressure.   Eyes: Negative for visual disturbance.  Respiratory: Negative for cough and chest tightness.   Gastrointestinal: Negative for abdominal pain and nausea.  Genitourinary: Negative for difficulty urinating, frequency and vaginal pain.  Musculoskeletal: Positive for back pain. Negative for gait problem.  Skin: Negative for pallor and rash.  Neurological: Negative for dizziness, tremors, weakness, numbness and headaches.  Psychiatric/Behavioral: Negative for confusion, sleep disturbance and suicidal ideas.    Objective:  BP 124/80 (BP Location: Left Arm, Patient Position: Sitting, Cuff Size: Normal)   Pulse 79   Temp 98 F (36.7 C) (Oral)   Ht 5\' 8"  (1.727 m)   Wt 170 lb (77.1 kg)   SpO2 98%   BMI 25.85 kg/m   BP Readings from Last 3 Encounters:  12/05/18 124/80  11/07/18 120/80  09/27/18 120/80    Wt Readings from Last 3 Encounters:  12/05/18 170  lb (77.1 kg)  11/07/18 168 lb (76.2 kg)  09/27/18 170 lb (77.1 kg)    Physical Exam Constitutional:      General: She is not in acute distress.    Appearance: She is well-developed.  HENT:     Head: Normocephalic.     Right Ear: External ear normal.     Left Ear: External ear normal.     Nose: Nose normal.  Eyes:     General:        Right eye: No discharge.        Left eye: No discharge.     Conjunctiva/sclera: Conjunctivae normal.     Pupils: Pupils are equal, round, and reactive to light.  Neck:     Musculoskeletal: Normal range of motion and neck supple.      Thyroid: No thyromegaly.     Vascular: No JVD.     Trachea: No tracheal deviation.  Cardiovascular:     Rate and Rhythm: Normal rate and regular rhythm.     Heart sounds: Normal heart sounds.  Pulmonary:     Effort: No respiratory distress.     Breath sounds: No stridor. No wheezing.  Abdominal:     General: Bowel sounds are normal. There is no distension.     Palpations: Abdomen is soft. There is no mass.     Tenderness: There is no abdominal tenderness. There is no guarding or rebound.  Musculoskeletal:        General: No tenderness.  Lymphadenopathy:     Cervical: No cervical adenopathy.  Skin:    Findings: No erythema or rash.  Neurological:     Cranial Nerves: No cranial nerve deficit.     Motor: No abnormal muscle tone.     Coordination: Coordination normal.     Deep Tendon Reflexes: Reflexes normal.  Psychiatric:        Behavior: Behavior normal.        Thought Content: Thought content normal.        Judgment: Judgment normal.    LS w/pain   Lab Results  Component Value Date   WBC 5.2 09/04/2018   HGB 13.1 09/04/2018   HCT 40.2 09/04/2018   PLT 385.0 09/04/2018   GLUCOSE 132 (H) 09/04/2018   CHOL 172 09/04/2018   TRIG 80.0 09/04/2018   HDL 57.50 09/04/2018   LDLDIRECT 122.0 03/03/2008   LDLCALC 99 09/04/2018   ALT 26 09/04/2018   AST 23 09/04/2018   NA 142 09/04/2018   K 4.6 09/04/2018   CL 102 09/04/2018   CREATININE 0.63 09/04/2018   BUN 18 09/04/2018   CO2 31 09/04/2018   TSH 1.42 09/04/2018   HGBA1C 6.5 (A) 09/17/2018   MICROALBUR <0.7 01/27/2017    No results found.  Assessment & Plan:   There are no diagnoses linked to this encounter.   No orders of the defined types were placed in this encounter.    Follow-up: No follow-ups on file.  Walker Kehr, MD

## 2018-12-05 NOTE — Assessment & Plan Note (Signed)
On B12 

## 2018-12-06 ENCOUNTER — Encounter: Payer: Self-pay | Admitting: Obstetrics and Gynecology

## 2018-12-06 ENCOUNTER — Ambulatory Visit (INDEPENDENT_AMBULATORY_CARE_PROVIDER_SITE_OTHER): Payer: Commercial Managed Care - PPO | Admitting: Obstetrics and Gynecology

## 2018-12-06 VITALS — BP 118/70 | HR 88 | Temp 97.1°F | Resp 14 | Wt 170.0 lb

## 2018-12-06 DIAGNOSIS — R87610 Atypical squamous cells of undetermined significance on cytologic smear of cervix (ASC-US): Secondary | ICD-10-CM | POA: Diagnosis not present

## 2018-12-06 DIAGNOSIS — Z87411 Personal history of vaginal dysplasia: Secondary | ICD-10-CM

## 2018-12-06 DIAGNOSIS — L292 Pruritus vulvae: Secondary | ICD-10-CM

## 2018-12-06 DIAGNOSIS — B977 Papillomavirus as the cause of diseases classified elsewhere: Secondary | ICD-10-CM

## 2018-12-06 MED ORDER — TRIAMCINOLONE ACETONIDE 0.1 % EX OINT: 1.0000 "application " | TOPICAL_OINTMENT | Freq: Two times a day (BID) | CUTANEOUS | 0 refills | Status: DC

## 2018-12-06 NOTE — Patient Instructions (Signed)

## 2018-12-07 ENCOUNTER — Other Ambulatory Visit: Payer: Self-pay | Admitting: Obstetrics and Gynecology

## 2018-12-17 ENCOUNTER — Ambulatory Visit: Payer: Commercial Managed Care - PPO | Admitting: Endocrinology

## 2018-12-19 ENCOUNTER — Other Ambulatory Visit: Payer: Self-pay

## 2018-12-19 DIAGNOSIS — B977 Papillomavirus as the cause of diseases classified elsewhere: Secondary | ICD-10-CM

## 2018-12-19 DIAGNOSIS — R87610 Atypical squamous cells of undetermined significance on cytologic smear of cervix (ASC-US): Secondary | ICD-10-CM

## 2018-12-19 DIAGNOSIS — N879 Dysplasia of cervix uteri, unspecified: Secondary | ICD-10-CM

## 2018-12-20 ENCOUNTER — Other Ambulatory Visit: Payer: Self-pay

## 2018-12-20 ENCOUNTER — Encounter: Payer: Self-pay | Admitting: Endocrinology

## 2018-12-20 ENCOUNTER — Ambulatory Visit (INDEPENDENT_AMBULATORY_CARE_PROVIDER_SITE_OTHER): Payer: Commercial Managed Care - PPO | Admitting: Endocrinology

## 2018-12-20 VITALS — BP 120/78 | HR 84 | Ht 68.0 in | Wt 174.0 lb

## 2018-12-20 DIAGNOSIS — E1140 Type 2 diabetes mellitus with diabetic neuropathy, unspecified: Secondary | ICD-10-CM | POA: Diagnosis not present

## 2018-12-20 LAB — POCT GLYCOSYLATED HEMOGLOBIN (HGB A1C): Hemoglobin A1C: 6.3 % — AB (ref 4.0–5.6)

## 2018-12-20 NOTE — Patient Instructions (Addendum)
Please continue the same medications I would be happy to see you back here as needed  

## 2018-12-20 NOTE — Progress Notes (Signed)
Subjective:    Patient ID: Alexandra [name], female    DOB: May 25, 1965, 53 y.o.   MRN: [id number]  HPI Pt returns for f/u of diabetes mellitus: DM type: 2 (she is presumed to be evolving type 1, based on lean body habitus, and mother's h/o type 1).   Dx'ed: 1856 Complications: none Therapy: 3 oral meds.  GDM: never DKA: never Severe hypoglycemia: never Pancreatitis: never Other: she has never been on insulin, but she has learned how; She has a high-deductible plan, on which she cannot afford brand-name meds.  Interval history: pt states she feels well in general.  She takes meds as rx'ed.  she brings her meter with her cbg's which I have reviewed today. All are in the 100's.   Past Medical History:  Diagnosis Date  . ALLERGIC RHINITIS   . Allergy   . Asthma   . CTS (carpal tunnel syndrome)   . Diabetes mellitus   . Fibroids    uterine  . Glaucoma    ?  Marland Kitchen Hx of adenomatous colonic polyps 12/15/2017  . Hypertension   . Low back pain   . Osteoarthritis   . Polyarthralgia   . Vitamin B12 deficiency     Past Surgical History:  Procedure Laterality Date  . BACK SURGERY     2011  . cervical bx      Social History   Socioeconomic History  . Marital status: Single    Spouse name: Not on file  . Number of children: Not on file  . Years of education: Not on file  . Highest education level: Not on file  Occupational History  . Occupation: WORKS SECOND SHIFT    Employer: [employer]    Comment: Public house manager  Social Needs  . Financial resource strain: Not on file  . Food insecurity    Worry: Not on file    Inability: Not on file  . Transportation needs    Medical: Not on file    Non-medical: Not on file  Tobacco Use  . Smoking status: Never Smoker  . Smokeless tobacco: Never Used  Substance and Sexual Activity  . Alcohol use: Yes    Comment: occasional wine  . Drug use: No  . Sexual activity: Not Currently    Birth control/protection: None  Lifestyle  .  Physical activity    Days per week: Not on file    Minutes per session: Not on file  . Stress: Not on file  Relationships  . Social Herbalist on phone: Not on file    Gets together: Not on file    Attends religious service: Not on file    Active member of club or organization: Not on file    Attends meetings of clubs or organizations: Not on file    Relationship status: Not on file  . Intimate partner violence    Fear of current or ex partner: Not on file    Emotionally abused: Not on file    Physically abused: Not on file    Forced sexual activity: Not on file  Other Topics Concern  . Not on file  Social History Narrative  . Not on file    Current Outpatient Medications on File Prior to Visit  Medication Sig Dispense Refill  . albuterol (VENTOLIN HFA) 108 (90 Base) MCG/ACT inhaler Inhale 1 puff into the lungs every 4 (four) hours as needed. 18 g 3  . bromocriptine (PARLODEL) 2.5 MG tablet Take  1 tablet (2.5 mg total) by mouth daily. 90 tablet 3  . Cholecalciferol 1000 UNITS tablet Take 1 tablet (1,000 Units total) by mouth daily. 100 tablet 3  . cyclobenzaprine (FLEXERIL) 10 MG tablet Take 1 tablet (10 mg total) by mouth 2 (two) times daily as needed. 90 tablet 1  . famotidine (PEPCID) 40 MG tablet Take 1 tablet (40 mg total) by mouth daily. 90 tablet 3  . fluticasone furoate-vilanterol (BREO ELLIPTA) 100-25 MCG/INH AEPB Inhale 1 puff into the lungs 2 (two) times daily. 3 each 3  . gabapentin (NEURONTIN) 300 MG capsule Take 1 capsule (300 mg total) by mouth 3 (three) times daily. 270 capsule 3  . Ginkgo Biloba 60 MG CAPS Take 2 capsules by mouth daily.    Marland Kitchen glucose blood (ONETOUCH VERIO) test strip And lancets 1/day 100 each 3  . hydrochlorothiazide (MICROZIDE) 12.5 MG capsule Take 1 capsule by mouth once daily in the morning 90 capsule 3  . ibuprofen (ADVIL) 600 MG tablet Take 1 tablet (600 mg total) by mouth every 6 (six) hours as needed. 90 tablet 1  . loratadine  (CLARITIN) 10 MG tablet TAKE 1 TABLET BY MOUTH ONCE DAILY AS NEEDED FOR ALLERGIES 100 tablet 3  . metFORMIN (GLUCOPHAGE) 1000 MG tablet Take 1 tablet (1,000 mg total) by mouth 2 (two) times daily with a meal. 180 tablet 3  . mometasone (ELOCON) 0.1 % cream Apply 1 application topically 2 (two) times daily. 45 g 0  . pioglitazone (ACTOS) 45 MG tablet Take 1 tablet (45 mg total) by mouth daily. 90 tablet 3  . promethazine (PHENERGAN) 25 MG tablet Take 1 tablet (25 mg total) by mouth 2 (two) times daily as needed for nausea. 60 tablet 2  . triamcinolone ointment (KENALOG) 0.1 % Apply 1 application topically 2 (two) times daily. Only for occasional use, don't use for more than 1-2 weeks at a time 30 g 0   No current facility-administered medications on file prior to visit.     Allergies  Allergen Reactions  . Adhesive [Tape] Other (See Comments)    EKG lead adhesive caused skin break down  . Benazepril Hcl     REACTION: cough  . Tramadol Hcl     REACTION: itching    Family History  Problem Relation Age of Onset  . Diabetes Mother   . Kidney disease Mother 33       ESRD on HD  . Heart disease Mother   . Colon polyps Mother   . Cancer Father        head and neck  . Hypertension Other   . Stroke Brother 45       CVAx2 on crack  . Breast cancer Neg Hx   . Colitis Neg Hx   . Esophageal cancer Neg Hx   . Rectal cancer Neg Hx   . Stomach cancer Neg Hx     BP 120/78 (BP Location: Right Arm, Patient Position: Sitting, Cuff Size: Normal)   Pulse 84   Ht _0  (1.727 m)   Wt 174 lb (78.9 kg)   SpO2 97%   BMI 26.46 kg/m   Review of Systems Denies ankle swelling    Objective:   Physical Exam VITAL SIGNS:  See vs page GENERAL: no distress Pulses: dorsalis pedis intact bilat.   MSK: no deformity of the feet CV: no leg edema Skin:  no ulcer on the feet.  normal color and temp on the feet. Neuro: sensation is intact  to touch on the feet  A1c=6.3%     Assessment & Plan:   Type 2 DM: well-controlled.  She wants to f/u DM with Dr Alain Marion, and ret here PRN.   Lean body habitus: she is at risk for developing type 1  Patient Instructions  Please continue the same medications.  I would be happy to see you back here as needed.

## 2018-12-21 ENCOUNTER — Other Ambulatory Visit: Payer: Self-pay

## 2018-12-24 NOTE — Progress Notes (Signed)
GYNECOLOGY  VISIT   HPI: 53 y.o.   Single Black or African American Not Hispanic or Latino  female   [id number] with No LMP recorded. Patient is postmenopausal.   here for LEEP. H/O AScus pap, HPV, unsatisfactory colposcopy. Prior h/o ASC-H  GYNECOLOGIC HISTORY: No LMP recorded. Patient is postmenopausal. Contraception: Postmenopausal Menopausal hormone therapy: None        OB History    Gravida  0   Para  0   Term  0   Preterm  0   AB  0   Living  0     SAB  0   TAB  0   Ectopic  0   Multiple  0   Live Births  0              Patient Active Problem List   Diagnosis Date Noted  . Diabetic neuropathy ([place]) 09/04/2018  . Hx of adenomatous colonic polyps 12/15/2017  . Left foot pain 10/07/2016  . Well adult exam 01/18/2016  . DM2 (diabetes mellitus, type 2) ([place]) 05/01/2014  . Leg cramps 12/27/2013  . Wound, open, foot 09/28/2012  . Cramps, extremity 08/27/2012  . Insomnia 05/21/2012  . CTS (carpal tunnel syndrome)   . Unspecified glaucoma(365.9)   . Polyarthralgia   . Vitamin B12 deficiency   . CHEST PAIN 05/28/2009  . LUMBAR RADICULOPATHY, RIGHT 09/25/2008  . URI 03/18/2008  . BRONCHITIS, ACUTE 10/05/2007  . HYPOKALEMIA 07/06/2007  . Essential hypertension 03/11/2007  . ALLERGIC RHINITIS 03/11/2007  . Asthma 03/11/2007  . OSTEOARTHRITIS 03/08/2007  . LOW BACK PAIN 03/08/2007    Past Medical History:  Diagnosis Date  . ALLERGIC RHINITIS   . Allergy   . Asthma   . CTS (carpal tunnel syndrome)   . Diabetes mellitus   . Fibroids    uterine  . Glaucoma    ?  Marland Kitchen Hx of adenomatous colonic polyps 12/15/2017  . Hypertension   . Low back pain   . Osteoarthritis   . Polyarthralgia   . Vitamin B12 deficiency     Past Surgical History:  Procedure Laterality Date  . BACK SURGERY     2011  . cervical bx      Current Outpatient Medications  Medication Sig Dispense Refill  . albuterol (VENTOLIN HFA) 108 (90 Base) MCG/ACT inhaler Inhale 1 puff  into the lungs every 4 (four) hours as needed. 18 g 3  . bromocriptine (PARLODEL) 2.5 MG tablet Take 1 tablet (2.5 mg total) by mouth daily. 90 tablet 3  . Cholecalciferol 1000 UNITS tablet Take 1 tablet (1,000 Units total) by mouth daily. 100 tablet 3  . cyclobenzaprine (FLEXERIL) 10 MG tablet Take 1 tablet (10 mg total) by mouth 2 (two) times daily as needed. 90 tablet 1  . famotidine (PEPCID) 40 MG tablet Take 1 tablet (40 mg total) by mouth daily. 90 tablet 3  . fluticasone furoate-vilanterol (BREO ELLIPTA) 100-25 MCG/INH AEPB Inhale 1 puff into the lungs 2 (two) times daily. 3 each 3  . gabapentin (NEURONTIN) 300 MG capsule Take 1 capsule (300 mg total) by mouth 3 (three) times daily. 270 capsule 3  . Ginkgo Biloba 60 MG CAPS Take 2 capsules by mouth daily.    Marland Kitchen glucose blood (ONETOUCH VERIO) test strip And lancets 1/day 100 each 3  . hydrochlorothiazide (MICROZIDE) 12.5 MG capsule Take 1 capsule by mouth once daily in the morning 90 capsule 3  . ibuprofen (ADVIL) 600 MG tablet Take 1 tablet (  600 mg total) by mouth every 6 (six) hours as needed. 90 tablet 1  . loratadine (CLARITIN) 10 MG tablet TAKE 1 TABLET BY MOUTH ONCE DAILY AS NEEDED FOR ALLERGIES 100 tablet 3  . metFORMIN (GLUCOPHAGE) 1000 MG tablet Take 1 tablet (1,000 mg total) by mouth 2 (two) times daily with a meal. 180 tablet 3  . mometasone (ELOCON) 0.1 % cream Apply 1 application topically 2 (two) times daily. 45 g 0  . pioglitazone (ACTOS) 45 MG tablet Take 1 tablet (45 mg total) by mouth daily. 90 tablet 3  . promethazine (PHENERGAN) 25 MG tablet Take 1 tablet (25 mg total) by mouth 2 (two) times daily as needed for nausea. 60 tablet 2  . triamcinolone ointment (KENALOG) 0.1 % Apply 1 application topically 2 (two) times daily. Only for occasional use, don't use for more than 1-2 weeks at a time 30 g 0   No current facility-administered medications for this visit.      ALLERGIES: Adhesive [tape], Benazepril hcl, and Tramadol  hcl  Family History  Problem Relation Age of Onset  . Diabetes Mother   . Kidney disease Mother 95       ESRD on HD  . Heart disease Mother   . Colon polyps Mother   . Cancer Father        head and neck  . Hypertension Other   . Stroke Brother 45       CVAx2 on crack  . Breast cancer Neg Hx   . Colitis Neg Hx   . Esophageal cancer Neg Hx   . Rectal cancer Neg Hx   . Stomach cancer Neg Hx     Social History   Socioeconomic History  . Marital status: Single    Spouse name: Not on file  . Number of children: Not on file  . Years of education: Not on file  . Highest education level: Not on file  Occupational History  . Occupation: WORKS SECOND SHIFT    Employer: [employer]    Comment: Public house manager  Social Needs  . Financial resource strain: Not on file  . Food insecurity    Worry: Not on file    Inability: Not on file  . Transportation needs    Medical: Not on file    Non-medical: Not on file  Tobacco Use  . Smoking status: Never Smoker  . Smokeless tobacco: Never Used  Substance and Sexual Activity  . Alcohol use: Yes    Comment: occasional wine  . Drug use: No  . Sexual activity: Not Currently    Birth control/protection: None  Lifestyle  . Physical activity    Days per week: Not on file    Minutes per session: Not on file  . Stress: Not on file  Relationships  . Social Herbalist on phone: Not on file    Gets together: Not on file    Attends religious service: Not on file    Active member of club or organization: Not on file    Attends meetings of clubs or organizations: Not on file    Relationship status: Not on file  . Intimate partner violence    Fear of current or ex partner: Not on file    Emotionally abused: Not on file    Physically abused: Not on file    Forced sexual activity: Not on file  Other Topics Concern  . Not on file  Social History Narrative  . Not  on file    Review of Systems  Constitutional: Negative.    HENT: Negative.   Eyes: Negative.   Respiratory: Negative.   Cardiovascular: Negative.   Gastrointestinal: Negative.   Genitourinary: Negative.   Musculoskeletal: Negative.   Skin: Negative.   Neurological: Negative.   Endo/Heme/Allergies: Negative.   Psychiatric/Behavioral: Negative.     PHYSICAL EXAMINATION:    BP 122/84 (BP Location: Right Arm, Patient Position: Sitting, Cuff Size: Normal)   Pulse 84   Temp 97.6 F (36.4 C) (Temporal)   Wt 176 lb 3.2 oz (79.9 kg)   BMI 26.79 kg/m     General appearance: alert, cooperative and appears stated age  Pelvic: External genitalia:  no lesions              Urethra:  normal appearing urethra with no masses, tenderness or lesions              Bartholins and Skenes: normal                 Vagina: normal appearing vagina with normal color and discharge, no lesions              Cervix: no lesions  Procedure: The patient was counseled as to the risks of the procedure, including: infection, bleeding and cervical stenosis. A consent form was signed.  Under colposcopic guidance, Lugols solution was placed on the cervix and a paracervical block was injected using 1% lidocaine with epinephrine. Under colposcopic guidance, the 1 x 1 cm loop was used to remove a portion of the ectocervix taking care to get the entire transformation zone. The patients vagina is atrophic, visualization with the pederson speculum was slightly difficult. The ectocervix was removed in 2 pieces A second 1 x 1 cm loop was used to remove a portion of the endocervix, removed in a few pieces. The settings were 55 cut, 24 coag with a blend of 1.  An ECC was performed. The cautery ball was then used to cauterize the base of the biopsy site and monsels were placed. The patient tolerated the procedure well.    Chaperone was present for exam.  ASSESSMENT Recurrent HPV on pap, ASUCS, unsatisfactory, prior ASC-H pap    PLAN Loop cone with ECC Further plans depending on  results   An After Visit Summary was printed and given to the patient.

## 2018-12-25 ENCOUNTER — Encounter: Payer: Self-pay | Admitting: Obstetrics and Gynecology

## 2018-12-25 ENCOUNTER — Ambulatory Visit (INDEPENDENT_AMBULATORY_CARE_PROVIDER_SITE_OTHER): Payer: Commercial Managed Care - PPO | Admitting: Obstetrics and Gynecology

## 2018-12-25 ENCOUNTER — Other Ambulatory Visit: Payer: Self-pay | Admitting: Obstetrics and Gynecology

## 2018-12-25 ENCOUNTER — Other Ambulatory Visit: Payer: Self-pay

## 2018-12-25 VITALS — BP 122/84 | HR 84 | Temp 97.6°F | Wt 176.2 lb

## 2018-12-25 DIAGNOSIS — N879 Dysplasia of cervix uteri, unspecified: Secondary | ICD-10-CM | POA: Diagnosis not present

## 2018-12-25 DIAGNOSIS — A630 Anogenital (venereal) warts: Secondary | ICD-10-CM | POA: Diagnosis not present

## 2018-12-25 DIAGNOSIS — R87610 Atypical squamous cells of undetermined significance on cytologic smear of cervix (ASC-US): Secondary | ICD-10-CM | POA: Diagnosis not present

## 2018-12-25 DIAGNOSIS — B977 Papillomavirus as the cause of diseases classified elsewhere: Secondary | ICD-10-CM

## 2018-12-25 NOTE — Patient Instructions (Signed)

## 2019-01-02 ENCOUNTER — Ambulatory Visit
Admission: RE | Admit: 2019-01-02 | Discharge: 2019-01-02 | Disposition: A | Discharge status: Home / Self Care | Payer: Commercial Managed Care - PPO | Source: Ambulatory Visit | Attending: Internal Medicine | Admitting: Internal Medicine

## 2019-01-02 ENCOUNTER — Other Ambulatory Visit: Payer: Self-pay

## 2019-01-02 DIAGNOSIS — Z1231 Encounter for screening mammogram for malignant neoplasm of breast: Secondary | ICD-10-CM

## 2019-01-24 ENCOUNTER — Ambulatory Visit: Payer: Commercial Managed Care - PPO | Admitting: Obstetrics and Gynecology

## 2019-06-05 ENCOUNTER — Ambulatory Visit (INDEPENDENT_AMBULATORY_CARE_PROVIDER_SITE_OTHER): Payer: Managed Care, Other (non HMO) | Admitting: Internal Medicine

## 2019-06-05 ENCOUNTER — Other Ambulatory Visit: Payer: Self-pay

## 2019-06-05 ENCOUNTER — Encounter: Payer: Self-pay | Admitting: Internal Medicine

## 2019-06-05 DIAGNOSIS — I10 Essential (primary) hypertension: Secondary | ICD-10-CM | POA: Diagnosis not present

## 2019-06-05 DIAGNOSIS — E119 Type 2 diabetes mellitus without complications: Secondary | ICD-10-CM

## 2019-06-05 LAB — BASIC METABOLIC PANEL
BUN: 14 mg/dL (ref 6–23)
CO2: 30 mEq/L (ref 19–32)
Calcium: 9.4 mg/dL (ref 8.4–10.5)
Chloride: 101 mEq/L (ref 96–112)
Creatinine, Ser: 0.56 mg/dL (ref 0.40–1.20)
GFR: 136.93 mL/min (ref >60.00)
Glucose, Bld: 98 mg/dL (ref 70–99)
Potassium: 4.2 mEq/L (ref 3.5–5.1)
Sodium: 139 mEq/L (ref 135–145)

## 2019-06-05 LAB — HEMOGLOBIN A1C: Hgb A1c MFr Bld: 7.2 % — ABNORMAL HIGH (ref 4.6–6.5)

## 2019-06-05 MED ORDER — CYCLOBENZAPRINE HCL 10 MG PO TABS: 10.0000 mg | ORAL_TABLET | Freq: Two times a day (BID) | ORAL | 1 refills | Status: DC | PRN

## 2019-06-05 MED ORDER — PIOGLITAZONE HCL 45 MG PO TABS: 45.0000 mg | ORAL_TABLET | Freq: Every day | ORAL | 3 refills | Status: DC

## 2019-06-05 MED ORDER — HYDROCHLOROTHIAZIDE 12.5 MG PO CAPS: ORAL_CAPSULE | ORAL | 3 refills | Status: DC

## 2019-06-05 MED ORDER — METFORMIN HCL 1000 MG PO TABS: 1000.0000 mg | ORAL_TABLET | Freq: Two times a day (BID) | ORAL | 3 refills | Status: DC

## 2019-06-05 MED ORDER — BROMOCRIPTINE MESYLATE 2.5 MG PO TABS: 2.5000 mg | ORAL_TABLET | Freq: Every day | ORAL | 3 refills | Status: DC

## 2019-06-05 MED ORDER — IBUPROFEN 600 MG PO TABS: 600.0000 mg | ORAL_TABLET | Freq: Four times a day (QID) | ORAL | 1 refills | Status: DC | PRN

## 2019-06-05 MED ORDER — BREO ELLIPTA 100-25 MCG/INH IN AEPB: 1.0000 | INHALATION_SPRAY | Freq: Two times a day (BID) | RESPIRATORY_TRACT | 3 refills | Status: DC

## 2019-06-05 MED ORDER — LORATADINE 10 MG PO TABS: ORAL_TABLET | ORAL | 3 refills | Status: DC

## 2019-06-05 NOTE — Patient Instructions (Signed)
FEMA mass vaccine site in [place]:  Call the COVID-19 Vaccine Help Center at [phone] to schedule your shot at [place].   [place] federal vaccine clinic is preparing to open on Wednesday 05/29/19. The FEMA site at [place] has the capacity to vaccinate 3,000 people a day for eight weeks. That's nearly 170,000 doses just from this one clinic.   With such a big operation, here are answers to some questions you may have about the drive-thru and indoor site:  How do I make an appointment?   Head to [email or web address] to schedule your appointment indoors or in the drive-thru, or call the COVID-19 Vaccine Help Center at [phone].  What if I need to change or cancel my appointment, or have more questions?   Call the COVID-19 Vaccine Help Center at [phone].  What vaccines will be available at the clinic?   The vaccine clinic will begin giving both Pfizer and Moderna two-dose COVID-19 vaccines. The single-dose [employer] vaccines will be given during the last two weeks of the clinic.   What part of the [facility] [place] [place] [facility] do I enter for my appointment?  Enter from [address] and turn onto [address]. A clinic staff member will confirm your appointment for the day. You'll then either be directed to registration or to a waiting area until your appointment time.  Can I be seen sooner if I'm early?  Those who are early will park in a designated waiting area until their vaccine appointment time approaches.  How does registration work?  There are five open lanes for registration. Someone will get the necessary information needed to confirm appointments and other details to keep a record of who's getting the vaccine every day. Temperatures will be checked to make sure you're in good shape to get the vaccine.  How long will it take to get my shot?  You'll park your car in a long tent with about 10  other vehicles. All 10 people in that group will get a shot inside their vehicles. Getting the actual shot only takes a few minutes. The entire process takes about 30 minutes.  How does the observation period work?  All patients will wait in the same tent they got their vaccine at for 15 minutes.       

## 2019-06-05 NOTE — Progress Notes (Signed)
Subjective:  Patient ID: Alexandra [name], female    DOB: 1966-03-19  Age: 54 y.o. MRN: [id number]  CC: Annual Exam   HPI Alexandra [name] presents for DM, HTN, GERD, LBP f/u  Alexandra [name] is buying a house. New job - Statistician. Roommate - Hassan Rowan.  Outpatient Medications Prior to Visit  Medication Sig Dispense Refill  . albuterol (VENTOLIN HFA) 108 (90 Base) MCG/ACT inhaler Inhale 1 puff into the lungs every 4 (four) hours as needed. 18 g 3  . Cholecalciferol 1000 UNITS tablet Take 1 tablet (1,000 Units total) by mouth daily. 100 tablet 3  . famotidine (PEPCID) 40 MG tablet Take 1 tablet (40 mg total) by mouth daily. 90 tablet 3  . gabapentin (NEURONTIN) 300 MG capsule Take 1 capsule (300 mg total) by mouth 3 (three) times daily. 270 capsule 3  . Ginkgo Biloba 60 MG CAPS Take 2 capsules by mouth daily.    Alexandra [name] Kitchen glucose blood (ONETOUCH VERIO) test strip And lancets 1/day 100 each 3  . mometasone (ELOCON) 0.1 % cream Apply 1 application topically 2 (two) times daily. 45 g 0  . promethazine (PHENERGAN) 25 MG tablet Take 1 tablet (25 mg total) by mouth 2 (two) times daily as needed for nausea. 60 tablet 2  . triamcinolone ointment (KENALOG) 0.1 % Apply 1 application topically 2 (two) times daily. Only for occasional use, don't use for more than 1-2 weeks at a time 30 g 0  . bromocriptine (PARLODEL) 2.5 MG tablet Take 1 tablet (2.5 mg total) by mouth daily. 90 tablet 3  . cyclobenzaprine (FLEXERIL) 10 MG tablet Take 1 tablet (10 mg total) by mouth 2 (two) times daily as needed. 90 tablet 1  . fluticasone furoate-vilanterol (BREO ELLIPTA) 100-25 MCG/INH AEPB Inhale 1 puff into the lungs 2 (two) times daily. 3 each 3  . hydrochlorothiazide (MICROZIDE) 12.5 MG capsule Take 1 capsule by mouth once daily in the morning 90 capsule 3  . ibuprofen (ADVIL) 600 MG tablet Take 1 tablet (600 mg total) by mouth every 6 (six) hours as needed. 90 tablet 1  . loratadine (CLARITIN) 10 MG tablet TAKE 1  TABLET BY MOUTH ONCE DAILY AS NEEDED FOR ALLERGIES 100 tablet 3  . metFORMIN (GLUCOPHAGE) 1000 MG tablet Take 1 tablet (1,000 mg total) by mouth 2 (two) times daily with a meal. 180 tablet 3  . pioglitazone (ACTOS) 45 MG tablet Take 1 tablet (45 mg total) by mouth daily. 90 tablet 3   No facility-administered medications prior to visit.    ROS: Review of Systems  Constitutional: Negative for activity change, appetite change, chills, fatigue and unexpected weight change.  HENT: Negative for congestion, mouth sores and sinus pressure.   Eyes: Negative for visual disturbance.  Respiratory: Negative for cough and chest tightness.   Gastrointestinal: Negative for abdominal pain and nausea.  Genitourinary: Negative for difficulty urinating, frequency and vaginal pain.  Musculoskeletal: Positive for back pain. Negative for gait problem.  Skin: Negative for pallor and rash.  Neurological: Negative for dizziness, tremors, weakness, numbness and headaches.  Psychiatric/Behavioral: Negative for confusion and sleep disturbance.    Objective:  BP 132/80 (BP Location: Left Arm)   Pulse 87   Temp 97.7 F (36.5 C) (Oral)   Wt 181 lb 6.4 oz (82.3 kg)   SpO2 97%   BMI 27.58 kg/m   BP Readings from Last 3 Encounters:  06/05/19 132/80  12/25/18 122/84  12/20/18 120/78    Wt Readings from Last 3  Encounters:  06/05/19 181 lb 6.4 oz (82.3 kg)  12/25/18 176 lb 3.2 oz (79.9 kg)  12/20/18 174 lb (78.9 kg)    Physical Exam Constitutional:      General: She is not in acute distress.    Appearance: She is well-developed.  HENT:     Head: Normocephalic.     Right Ear: External ear normal.     Left Ear: External ear normal.     Nose: Nose normal.  Eyes:     General:        Right eye: No discharge.        Left eye: No discharge.     Conjunctiva/sclera: Conjunctivae normal.     Pupils: Pupils are equal, round, and reactive to light.  Neck:     Thyroid: No thyromegaly.     Vascular: No JVD.      Trachea: No tracheal deviation.  Cardiovascular:     Rate and Rhythm: Normal rate and regular rhythm.     Heart sounds: Normal heart sounds.  Pulmonary:     Effort: No respiratory distress.     Breath sounds: No stridor. No wheezing.  Abdominal:     General: Bowel sounds are normal. There is no distension.     Palpations: Abdomen is soft. There is no mass.     Tenderness: There is no abdominal tenderness. There is no guarding or rebound.  Musculoskeletal:        General: Tenderness present.     Cervical back: Normal range of motion and neck supple.  Lymphadenopathy:     Cervical: No cervical adenopathy.  Skin:    Findings: No erythema or rash.  Neurological:     Cranial Nerves: No cranial nerve deficit.     Motor: No abnormal muscle tone.     Coordination: Coordination normal.     Deep Tendon Reflexes: Reflexes normal.  Psychiatric:        Behavior: Behavior normal.        Thought Content: Thought content normal.        Judgment: Judgment normal.   LS spine - pain w/ROM  Lab Results  Component Value Date   WBC 5.2 09/04/2018   HGB 13.1 09/04/2018   HCT 40.2 09/04/2018   PLT 385.0 09/04/2018   GLUCOSE 132 (H) 09/04/2018   CHOL 172 09/04/2018   TRIG 80.0 09/04/2018   HDL 57.50 09/04/2018   LDLDIRECT 122.0 03/03/2008   LDLCALC 99 09/04/2018   ALT 26 09/04/2018   AST 23 09/04/2018   NA 142 09/04/2018   K 4.6 09/04/2018   CL 102 09/04/2018   CREATININE 0.63 09/04/2018   BUN 18 09/04/2018   CO2 31 09/04/2018   TSH 1.42 09/04/2018   HGBA1C 6.3 (A) 12/20/2018   MICROALBUR <0.7 01/27/2017    MM DIGITAL SCREENING BILATERAL  Result Date: 01/03/2019 CLINICAL DATA:  Screening. EXAM: DIGITAL SCREENING BILATERAL MAMMOGRAM WITH CAD COMPARISON:  Previous exam(s). ACR Breast Density Category c: The breast tissue is heterogeneously dense, which may obscure small masses. FINDINGS: There are no findings suspicious for malignancy. Images were processed with CAD. IMPRESSION:  No mammographic evidence of malignancy. A result letter of this screening mammogram will be mailed directly to the patient. RECOMMENDATION: Screening mammogram in one year. (Code:SM-B-01Y) BI-RADS CATEGORY  1: Negative. Electronically Signed   By: Ammie Ferrier M.D.   On: 01/03/2019 15:54    Assessment & Plan:   There are no diagnoses linked to this encounter.   Meds ordered  this encounter  Medications  . DISCONTD: bromocriptine (PARLODEL) 2.5 MG tablet    Sig: Take 1 tablet (2.5 mg total) by mouth daily.    Dispense:  90 tablet    Refill:  3  . DISCONTD: cyclobenzaprine (FLEXERIL) 10 MG tablet    Sig: Take 1 tablet (10 mg total) by mouth 2 (two) times daily as needed.    Dispense:  90 tablet    Refill:  1  . fluticasone furoate-vilanterol (BREO ELLIPTA) 100-25 MCG/INH AEPB    Sig: Inhale 1 puff into the lungs 2 (two) times daily.    Dispense:  3 each    Refill:  3    Please consider 90 day supplies to promote better adherence  . hydrochlorothiazide (MICROZIDE) 12.5 MG capsule    Sig: Take 1 capsule by mouth once daily in the morning    Dispense:  90 capsule    Refill:  3  . DISCONTD: ibuprofen (ADVIL) 600 MG tablet    Sig: Take 1 tablet (600 mg total) by mouth every 6 (six) hours as needed.    Dispense:  90 tablet    Refill:  1  . DISCONTD: loratadine (CLARITIN) 10 MG tablet    Sig: TAKE 1 TABLET BY MOUTH ONCE DAILY AS NEEDED FOR ALLERGIES    Dispense:  100 tablet    Refill:  3  . DISCONTD: metFORMIN (GLUCOPHAGE) 1000 MG tablet    Sig: Take 1 tablet (1,000 mg total) by mouth 2 (two) times daily with a meal.    Dispense:  180 tablet    Refill:  3  . DISCONTD: pioglitazone (ACTOS) 45 MG tablet    Sig: Take 1 tablet (45 mg total) by mouth daily.    Dispense:  90 tablet    Refill:  3  . bromocriptine (PARLODEL) 2.5 MG tablet    Sig: Take 1 tablet (2.5 mg total) by mouth daily.    Dispense:  90 tablet    Refill:  3  . cyclobenzaprine (FLEXERIL) 10 MG tablet    Sig:  Take 1 tablet (10 mg total) by mouth 2 (two) times daily as needed.    Dispense:  90 tablet    Refill:  1  . ibuprofen (ADVIL) 600 MG tablet    Sig: Take 1 tablet (600 mg total) by mouth every 6 (six) hours as needed.    Dispense:  90 tablet    Refill:  1  . loratadine (CLARITIN) 10 MG tablet    Sig: TAKE 1 TABLET BY MOUTH ONCE DAILY AS NEEDED FOR ALLERGIES    Dispense:  100 tablet    Refill:  3  . metFORMIN (GLUCOPHAGE) 1000 MG tablet    Sig: Take 1 tablet (1,000 mg total) by mouth 2 (two) times daily with a meal.    Dispense:  180 tablet    Refill:  3  . pioglitazone (ACTOS) 45 MG tablet    Sig: Take 1 tablet (45 mg total) by mouth daily.    Dispense:  90 tablet    Refill:  3     Follow-up: Return in about 6 months (around 12/06/2019) for Wellness Exam.  Walker Kehr, MD

## 2019-12-10 ENCOUNTER — Encounter: Payer: Self-pay | Admitting: Internal Medicine

## 2019-12-10 ENCOUNTER — Other Ambulatory Visit: Payer: Self-pay

## 2019-12-10 ENCOUNTER — Ambulatory Visit: Payer: Managed Care, Other (non HMO) | Admitting: Internal Medicine

## 2019-12-10 DIAGNOSIS — B353 Tinea pedis: Secondary | ICD-10-CM | POA: Diagnosis not present

## 2019-12-10 DIAGNOSIS — I10 Essential (primary) hypertension: Secondary | ICD-10-CM

## 2019-12-10 DIAGNOSIS — E538 Deficiency of other specified B group vitamins: Secondary | ICD-10-CM | POA: Diagnosis not present

## 2019-12-10 DIAGNOSIS — M5440 Lumbago with sciatica, unspecified side: Secondary | ICD-10-CM | POA: Diagnosis not present

## 2019-12-10 DIAGNOSIS — E1140 Type 2 diabetes mellitus with diabetic neuropathy, unspecified: Secondary | ICD-10-CM

## 2019-12-10 MED ORDER — ALBUTEROL SULFATE HFA 108 (90 BASE) MCG/ACT IN AERS: 1.0000 | INHALATION_SPRAY | RESPIRATORY_TRACT | 3 refills | Status: DC | PRN

## 2019-12-10 MED ORDER — FAMOTIDINE 40 MG PO TABS: 40.0000 mg | ORAL_TABLET | Freq: Every day | ORAL | 3 refills | Status: DC

## 2019-12-10 MED ORDER — CYCLOBENZAPRINE HCL 10 MG PO TABS: 10.0000 mg | ORAL_TABLET | Freq: Two times a day (BID) | ORAL | 1 refills | Status: DC | PRN

## 2019-12-10 MED ORDER — METFORMIN HCL 1000 MG PO TABS
1000.0000 mg | ORAL_TABLET | Freq: Two times a day (BID) | ORAL | 3 refills | Status: DC
Start: 2019-12-10 — End: 2020-06-08

## 2019-12-10 MED ORDER — LORATADINE 10 MG PO TABS
ORAL_TABLET | ORAL | 3 refills | Status: DC
Start: 2019-12-10 — End: 2020-06-08

## 2019-12-10 MED ORDER — HYDROCHLOROTHIAZIDE 12.5 MG PO CAPS: ORAL_CAPSULE | ORAL | 3 refills | Status: DC

## 2019-12-10 MED ORDER — BROMOCRIPTINE MESYLATE 2.5 MG PO TABS: 2.5000 mg | ORAL_TABLET | Freq: Every day | ORAL | 3 refills | Status: DC

## 2019-12-10 MED ORDER — IBUPROFEN 600 MG PO TABS: 600.0000 mg | ORAL_TABLET | Freq: Four times a day (QID) | ORAL | 1 refills | Status: DC | PRN

## 2019-12-10 MED ORDER — KETOCONAZOLE 2 % EX CREA: 1.0000 "application " | TOPICAL_CREAM | Freq: Every day | CUTANEOUS | 1 refills | Status: DC

## 2019-12-10 MED ORDER — PIOGLITAZONE HCL 45 MG PO TABS
45.0000 mg | ORAL_TABLET | Freq: Every day | ORAL | 3 refills | Status: DC
Start: 2019-12-10 — End: 2020-06-08

## 2019-12-10 MED ORDER — BREO ELLIPTA 100-25 MCG/INH IN AEPB: 1.0000 | INHALATION_SPRAY | Freq: Two times a day (BID) | RESPIRATORY_TRACT | 3 refills | Status: DC

## 2019-12-10 MED ORDER — GABAPENTIN 300 MG PO CAPS: 300.0000 mg | ORAL_CAPSULE | Freq: Three times a day (TID) | ORAL | 3 refills | Status: DC

## 2019-12-10 NOTE — Progress Notes (Signed)
Subjective:  Patient ID: Alexandra [name], female    DOB: 11/25/1965  Age: 54 y.o. MRN: [id number]  CC: No chief complaint on file.   HPI Alexandra [name] presents for DM, HTN, LBP C/o rash on L foot, between the toes #3-4-5 x 2 wks.  Outpatient Medications Prior to Visit  Medication Sig Dispense Refill  . albuterol (VENTOLIN HFA) 108 (90 Base) MCG/ACT inhaler Inhale 1 puff into the lungs every 4 (four) hours as needed. 18 g 3  . bromocriptine (PARLODEL) 2.5 MG tablet Take 1 tablet (2.5 mg total) by mouth daily. 90 tablet 3  . Cholecalciferol 1000 UNITS tablet Take 1 tablet (1,000 Units total) by mouth daily. 100 tablet 3  . cyclobenzaprine (FLEXERIL) 10 MG tablet Take 1 tablet (10 mg total) by mouth 2 (two) times daily as needed. 90 tablet 1  . famotidine (PEPCID) 40 MG tablet Take 1 tablet (40 mg total) by mouth daily. 90 tablet 3  . fluticasone furoate-vilanterol (BREO ELLIPTA) 100-25 MCG/INH AEPB Inhale 1 puff into the lungs 2 (two) times daily. 3 each 3  . gabapentin (NEURONTIN) 300 MG capsule Take 1 capsule (300 mg total) by mouth 3 (three) times daily. 270 capsule 3  . Ginkgo Biloba 60 MG CAPS Take 2 capsules by mouth daily.    Marland Kitchen glucose blood (ONETOUCH VERIO) test strip And lancets 1/day 100 each 3  . hydrochlorothiazide (MICROZIDE) 12.5 MG capsule Take 1 capsule by mouth once daily in the morning 90 capsule 3  . ibuprofen (ADVIL) 600 MG tablet Take 1 tablet (600 mg total) by mouth every 6 (six) hours as needed. 90 tablet 1  . loratadine (CLARITIN) 10 MG tablet TAKE 1 TABLET BY MOUTH ONCE DAILY AS NEEDED FOR ALLERGIES 100 tablet 3  . metFORMIN (GLUCOPHAGE) 1000 MG tablet Take 1 tablet (1,000 mg total) by mouth 2 (two) times daily with a meal. 180 tablet 3  . mometasone (ELOCON) 0.1 % cream Apply 1 application topically 2 (two) times daily. 45 g 0  . pioglitazone (ACTOS) 45 MG tablet Take 1 tablet (45 mg total) by mouth daily. 90 tablet 3  . promethazine (PHENERGAN) 25 MG  tablet Take 1 tablet (25 mg total) by mouth 2 (two) times daily as needed for nausea. 60 tablet 2  . triamcinolone ointment (KENALOG) 0.1 % Apply 1 application topically 2 (two) times daily. Only for occasional use, don't use for more than 1-2 weeks at a time 30 g 0   No facility-administered medications prior to visit.    ROS: Review of Systems  Constitutional: Negative for activity change, appetite change, chills, fatigue and unexpected weight change.  HENT: Negative for congestion, mouth sores and sinus pressure.   Eyes: Negative for visual disturbance.  Respiratory: Negative for cough and chest tightness.   Gastrointestinal: Negative for abdominal pain and nausea.  Genitourinary: Negative for difficulty urinating, frequency and vaginal pain.  Musculoskeletal: Positive for back pain and gait problem.  Skin: Positive for rash. Negative for pallor.  Neurological: Negative for dizziness, tremors, weakness, numbness and headaches.  Psychiatric/Behavioral: Negative for confusion and sleep disturbance.    Objective:  BP (!) 144/88   Pulse 91   Temp 98.3 F (36.8 C) (Oral)   Ht 5\' 8"  (1.727 m)   Wt 186 lb (84.4 kg)   SpO2 97%   BMI 28.28 kg/m   BP Readings from Last 3 Encounters:  12/10/19 (!) 144/88  06/05/19 132/80  12/25/18 122/84    Wt Readings from  Last 3 Encounters:  12/10/19 186 lb (84.4 kg)  06/05/19 181 lb 6.4 oz (82.3 kg)  12/25/18 176 lb 3.2 oz (79.9 kg)    Physical Exam Constitutional:      General: She is not in acute distress.    Appearance: She is well-developed.  HENT:     Head: Normocephalic.     Right Ear: External ear normal.     Left Ear: External ear normal.     Nose: Nose normal.  Eyes:     General:        Right eye: No discharge.        Left eye: No discharge.     Conjunctiva/sclera: Conjunctivae normal.     Pupils: Pupils are equal, round, and reactive to light.  Neck:     Thyroid: No thyromegaly.     Vascular: No JVD.     Trachea: No  tracheal deviation.  Cardiovascular:     Rate and Rhythm: Normal rate and regular rhythm.     Heart sounds: Normal heart sounds.  Pulmonary:     Effort: No respiratory distress.     Breath sounds: No stridor. No wheezing.  Abdominal:     General: Bowel sounds are normal. There is no distension.     Palpations: Abdomen is soft. There is no mass.     Tenderness: There is no abdominal tenderness. There is no guarding or rebound.  Musculoskeletal:        General: Tenderness present.     Cervical back: Normal range of motion and neck supple.  Lymphadenopathy:     Cervical: No cervical adenopathy.  Skin:    Findings: Rash present. No erythema.  Neurological:     Mental Status: She is oriented to person, place, and time.     Cranial Nerves: No cranial nerve deficit.     Motor: No abnormal muscle tone.     Coordination: Coordination normal.     Deep Tendon Reflexes: Reflexes normal.  Psychiatric:        Behavior: Behavior normal.        Thought Content: Thought content normal.        Judgment: Judgment normal.    rash on L foot, between the toes #3-4-5   Lab Results  Component Value Date   WBC 5.2 09/04/2018   HGB 13.1 09/04/2018   HCT 40.2 09/04/2018   PLT 385.0 09/04/2018   GLUCOSE 98 06/05/2019   CHOL 172 09/04/2018   TRIG 80.0 09/04/2018   HDL 57.50 09/04/2018   LDLDIRECT 122.0 03/03/2008   LDLCALC 99 09/04/2018   ALT 26 09/04/2018   AST 23 09/04/2018   NA 139 06/05/2019   K 4.2 06/05/2019   CL 101 06/05/2019   CREATININE 0.56 06/05/2019   BUN 14 06/05/2019   CO2 30 06/05/2019   TSH 1.42 09/04/2018   HGBA1C 7.2 (H) 06/05/2019   MICROALBUR <0.7 01/27/2017    MM DIGITAL SCREENING BILATERAL  Result Date: 01/03/2019 CLINICAL DATA:  Screening. EXAM: DIGITAL SCREENING BILATERAL MAMMOGRAM WITH CAD COMPARISON:  Previous exam(s). ACR Breast Density Category c: The breast tissue is heterogeneously dense, which may obscure small masses. FINDINGS: There are no findings  suspicious for malignancy. Images were processed with CAD. IMPRESSION: No mammographic evidence of malignancy. A result letter of this screening mammogram will be mailed directly to the patient. RECOMMENDATION: Screening mammogram in one year. (Code:SM-B-01Y) BI-RADS CATEGORY  1: Negative. Electronically Signed   By: Ammie Ferrier M.D.   On: 01/03/2019 15:54  Assessment & Plan:   There are no diagnoses linked to this encounter.    Walker Kehr, MD

## 2019-12-10 NOTE — Assessment & Plan Note (Signed)
Ketocon cream

## 2019-12-10 NOTE — Assessment & Plan Note (Signed)
On B12 

## 2019-12-10 NOTE — Assessment & Plan Note (Signed)
  Being seen q 6 mo between me and Dr Loanne Drilling On Actos, Parlodel, Metformin Labs

## 2019-12-10 NOTE — Assessment & Plan Note (Signed)
On Gabapentin 

## 2019-12-10 NOTE — Assessment & Plan Note (Signed)
On HCTZ Labs

## 2019-12-11 LAB — COMPLETE METABOLIC PANEL WITH GFR
AG Ratio: 1.3 (calc) (ref 1.0–2.5)
ALT: 28 U/L (ref 6–29)
AST: 31 U/L (ref 10–35)
Albumin: 4.3 g/dL (ref 3.6–5.1)
Alkaline phosphatase (APISO): 103 U/L (ref 37–153)
BUN: 10 mg/dL (ref 7–25)
CO2: 26 mmol/L (ref 20–32)
Calcium: 9.9 mg/dL (ref 8.6–10.4)
Chloride: 99 mmol/L (ref 98–110)
Creat: 0.66 mg/dL (ref 0.50–1.05)
GFR, Est African American: 117 mL/min/{1.73_m2} (ref >=60)
GFR, Est Non African American: 101 mL/min/{1.73_m2} (ref >=60)
Globulin: 3.2 g/dL (calc) (ref 1.9–3.7)
Glucose, Bld: 101 mg/dL — ABNORMAL HIGH (ref 65–99)
Potassium: 4.4 mmol/L (ref 3.5–5.3)
Sodium: 141 mmol/L (ref 135–146)
Total Bilirubin: 0.3 mg/dL (ref 0.2–1.2)
Total Protein: 7.5 g/dL (ref 6.1–8.1)

## 2019-12-11 LAB — TSH: TSH: 2.24 mIU/L

## 2019-12-11 LAB — HEMOGLOBIN A1C
Hgb A1c MFr Bld: 7 % of total Hgb — ABNORMAL HIGH (ref <5.7)
Mean Plasma Glucose: 154 (calc)
eAG (mmol/L): 8.5 (calc)

## 2019-12-23 ENCOUNTER — Telehealth: Payer: Self-pay | Admitting: Internal Medicine

## 2019-12-23 NOTE — Telephone Encounter (Signed)
ketoconazole (NIZORAL) 2 % cream Not working, should I give it another week?  He also mentioned pills that could be called in....patient would like to try the pills  [facility] ([address]), [place] Phone:  [phone]  Fax:  [phone]

## 2019-12-26 MED ORDER — KETOCONAZOLE 2 % EX CREA: 1.0000 "application " | TOPICAL_CREAM | Freq: Every day | CUTANEOUS | 1 refills | Status: AC

## 2019-12-26 NOTE — Telephone Encounter (Signed)
Called pt no answer LMOM MD sent cream to [facility].Marland KitchenJohny [name]

## 2019-12-26 NOTE — Telephone Encounter (Signed)
Done. Thanks.

## 2020-02-03 ENCOUNTER — Other Ambulatory Visit: Payer: Self-pay | Admitting: Internal Medicine

## 2020-02-19 ENCOUNTER — Other Ambulatory Visit: Payer: Self-pay | Admitting: Internal Medicine

## 2020-02-19 DIAGNOSIS — Z1231 Encounter for screening mammogram for malignant neoplasm of breast: Secondary | ICD-10-CM

## 2020-03-05 ENCOUNTER — Other Ambulatory Visit: Payer: Self-pay

## 2020-03-05 ENCOUNTER — Ambulatory Visit
Admission: RE | Admit: 2020-03-05 | Discharge: 2020-03-05 | Disposition: A | Discharge status: Home / Self Care | Payer: Managed Care, Other (non HMO) | Source: Ambulatory Visit | Attending: Internal Medicine | Admitting: Internal Medicine

## 2020-03-05 DIAGNOSIS — Z1231 Encounter for screening mammogram for malignant neoplasm of breast: Secondary | ICD-10-CM

## 2020-05-13 IMAGING — DX LUMBAR SPINE - COMPLETE 4+ VIEW
5 series · 5 of 5 positions shown · non-contrast
Comparison: MRI lumbar spine 03/03/2016 and earlier. Lumbar spine
x-rays 01/15/2010 and earlier.

CLINICAL DATA: [DATE] month history of low back pain radiating into
both LOWER extremities, RIGHT greater than LEFT. No recent injuries.
Prior unspecified back surgery.

EXAM:
LUMBAR SPINE - COMPLETE 4+ VIEW

[l-spine ap]
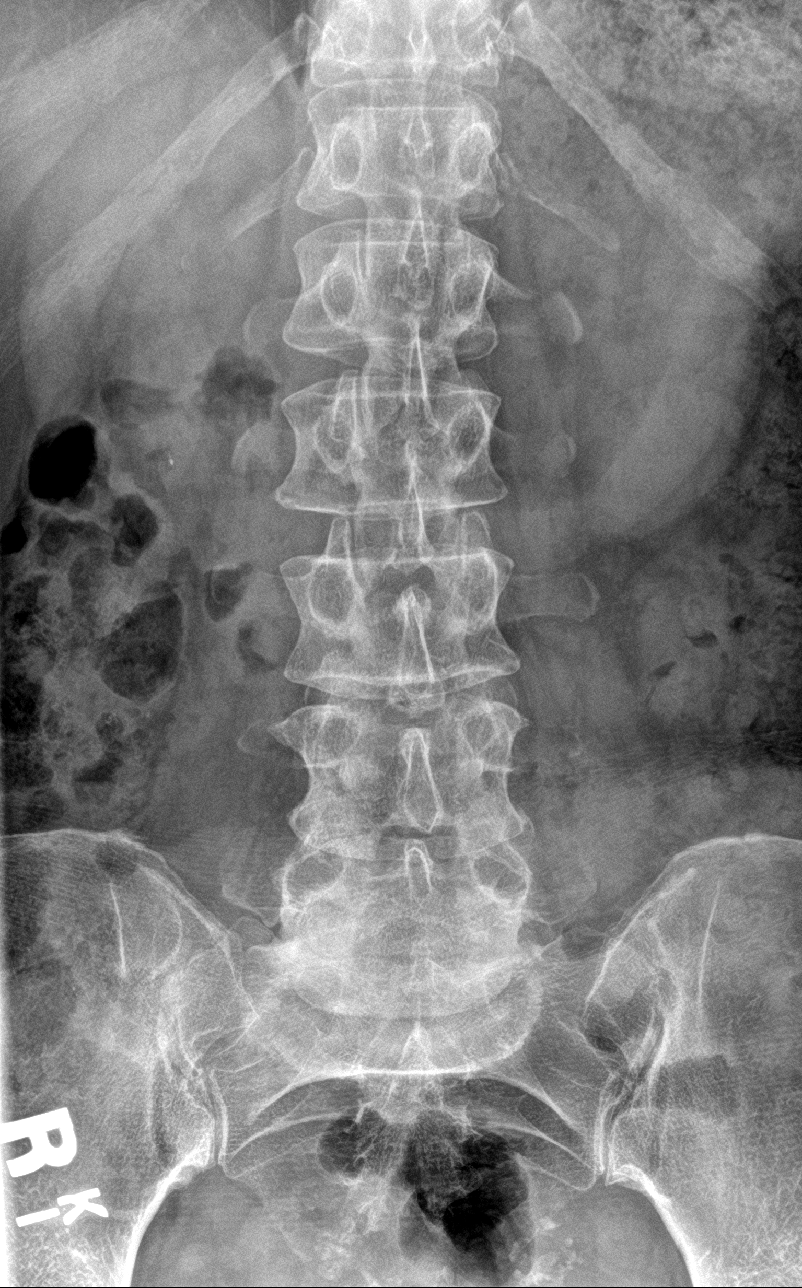

[l-spine obl (1 of 2)]
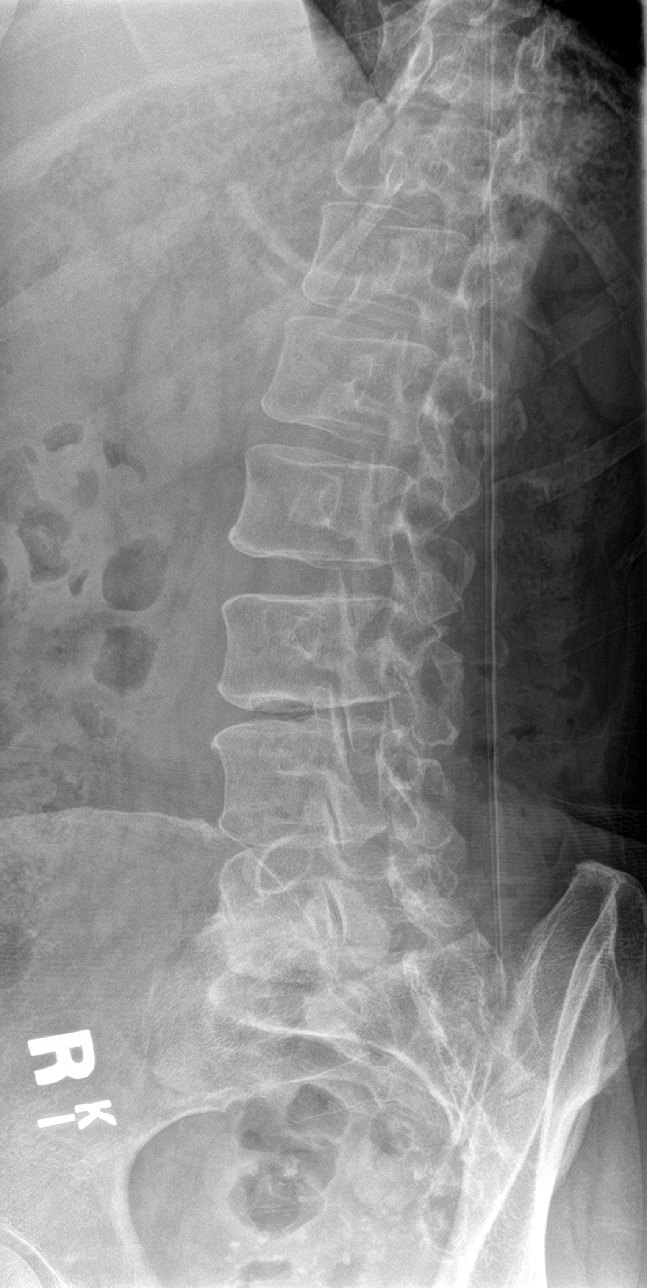

[l-spine obl (2 of 2)]
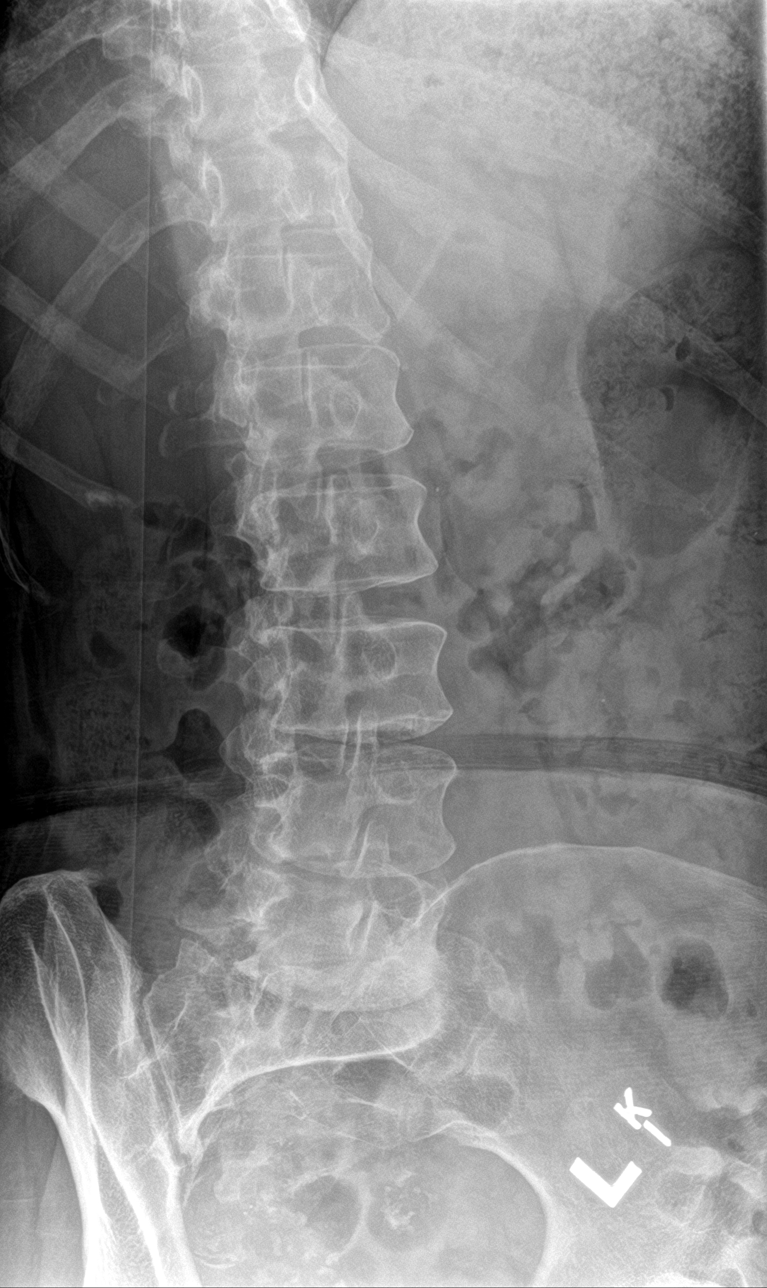

[l-spine lat]
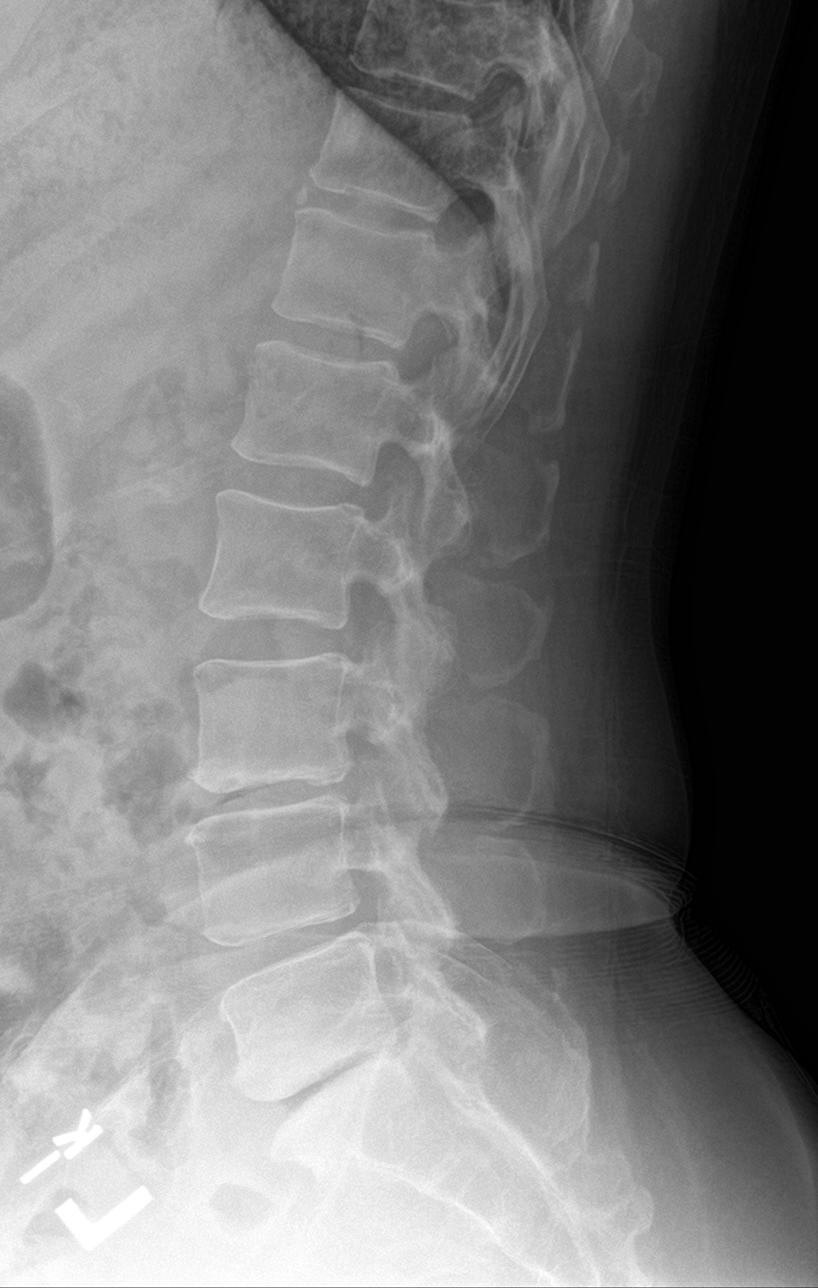

[l-spine spot]
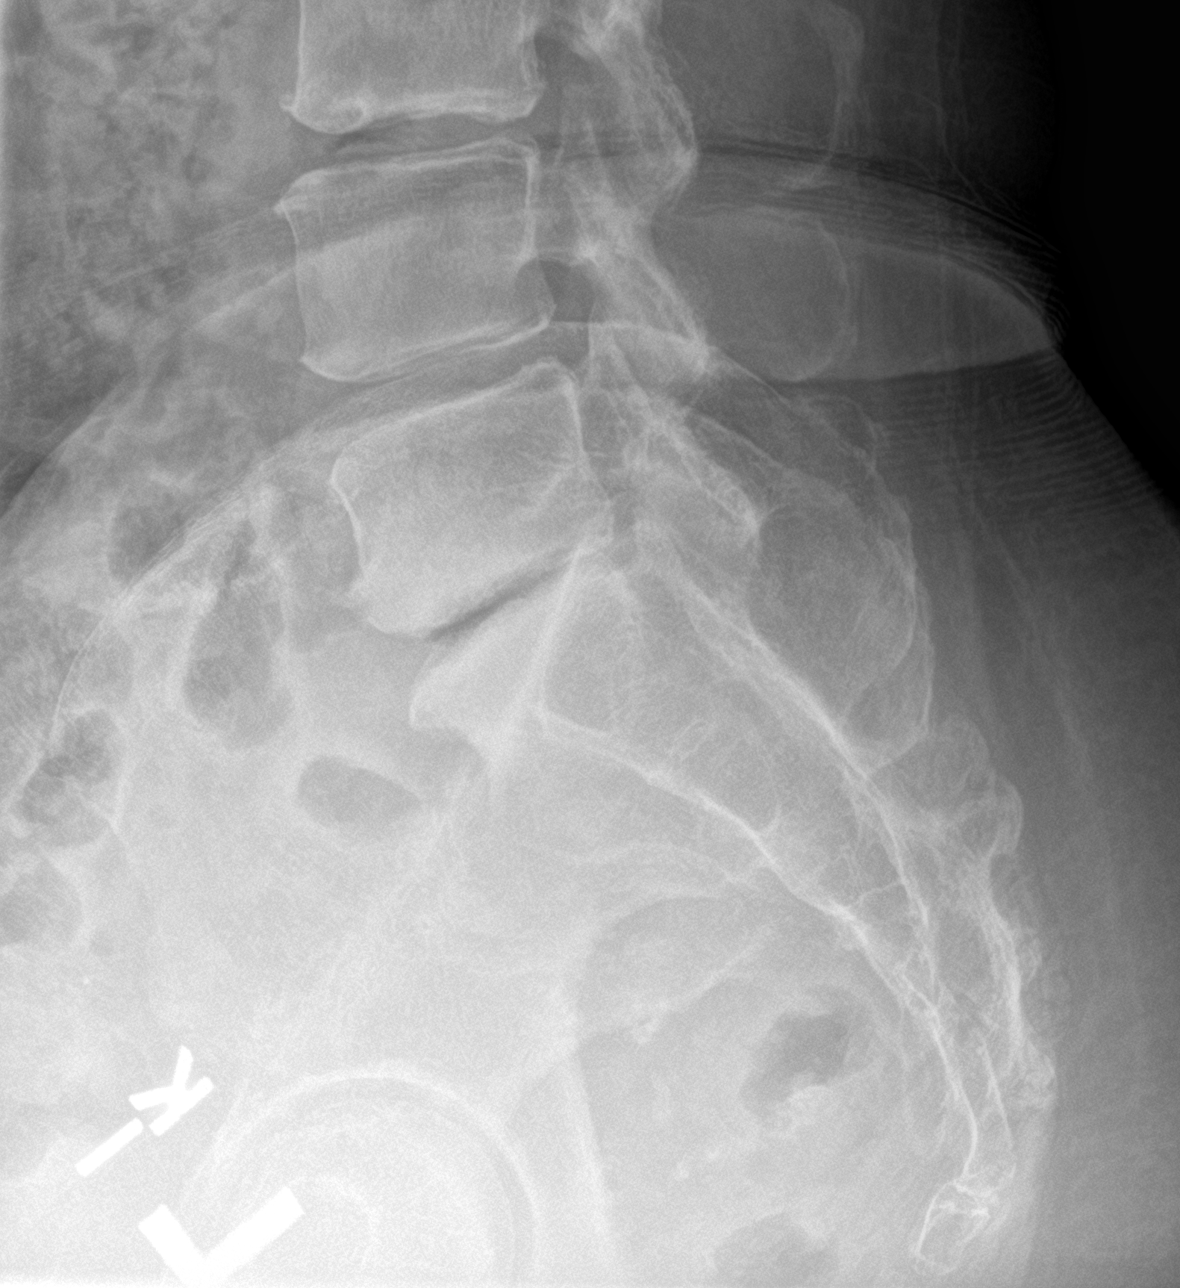

[5 of 5 positions shown; findings below may reference images not displayed]

FINDINGS: Five non-rib-bearing lumbar vertebrae with T12 having small,
rudimentary ribs. This follows the same numbering scheme as the
prior examinations.

Anatomic alignment. No fractures. Disc space narrowing at L3-4, L4-5
and L5-S1, most severe at L5-S1, progressive since 5677 and
unchanged since the 5976 MRI which demonstrated disc protrusions at
these levels. No pars defects. Moderate facet degenerative changes
on the RIGHT at L4-5. Sacroiliac joints anatomically aligned without
significant degenerative change.
IMPRESSION: 1. No acute or subacute osseous abnormality.
2. Degenerative disc disease at L3-4, L4-5 and L5-S1, stable since
the 5976 MRI which demonstrated disc protrusions at these levels.
3. Moderate facet degenerative changes on the RIGHT at L4-5.

## 2020-06-05 ENCOUNTER — Other Ambulatory Visit: Payer: Self-pay

## 2020-06-08 ENCOUNTER — Other Ambulatory Visit: Payer: Self-pay

## 2020-06-08 ENCOUNTER — Encounter: Payer: Self-pay | Admitting: Internal Medicine

## 2020-06-08 ENCOUNTER — Ambulatory Visit: Payer: Managed Care, Other (non HMO) | Admitting: Internal Medicine

## 2020-06-08 VITALS — BP 122/84 | HR 89 | Temp 98.5°F | Ht 68.0 in | Wt 183.4 lb

## 2020-06-08 DIAGNOSIS — E1142 Type 2 diabetes mellitus with diabetic polyneuropathy: Secondary | ICD-10-CM

## 2020-06-08 DIAGNOSIS — E538 Deficiency of other specified B group vitamins: Secondary | ICD-10-CM

## 2020-06-08 DIAGNOSIS — E785 Hyperlipidemia, unspecified: Secondary | ICD-10-CM | POA: Diagnosis not present

## 2020-06-08 DIAGNOSIS — E119 Type 2 diabetes mellitus without complications: Secondary | ICD-10-CM | POA: Diagnosis not present

## 2020-06-08 DIAGNOSIS — J4520 Mild intermittent asthma, uncomplicated: Secondary | ICD-10-CM | POA: Diagnosis not present

## 2020-06-08 DIAGNOSIS — I10 Essential (primary) hypertension: Secondary | ICD-10-CM | POA: Diagnosis not present

## 2020-06-08 MED ORDER — PIOGLITAZONE HCL 45 MG PO TABS
45.0000 mg | ORAL_TABLET | Freq: Every day | ORAL | 3 refills | Status: DC
Start: 2020-06-08 — End: 2020-12-07

## 2020-06-08 MED ORDER — TRIAMCINOLONE ACETONIDE 0.5 % EX OINT: 1.0000 "application " | TOPICAL_OINTMENT | Freq: Three times a day (TID) | CUTANEOUS | 1 refills | Status: DC

## 2020-06-08 MED ORDER — METFORMIN HCL 1000 MG PO TABS
1000.0000 mg | ORAL_TABLET | Freq: Two times a day (BID) | ORAL | 3 refills | Status: DC
Start: 2020-06-08 — End: 2020-12-07

## 2020-06-08 MED ORDER — BROMOCRIPTINE MESYLATE 2.5 MG PO TABS
2.5000 mg | ORAL_TABLET | Freq: Every day | ORAL | 3 refills | Status: DC
Start: 2020-06-08 — End: 2021-03-08

## 2020-06-08 MED ORDER — LORATADINE 10 MG PO TABS
ORAL_TABLET | ORAL | 3 refills | Status: DC
Start: 2020-06-08 — End: 2020-10-26

## 2020-06-08 MED ORDER — IBUPROFEN 600 MG PO TABS
600.0000 mg | ORAL_TABLET | Freq: Four times a day (QID) | ORAL | 3 refills | Status: DC | PRN
Start: 2020-06-08 — End: 2021-09-01

## 2020-06-08 MED ORDER — KETOCONAZOLE 200 MG PO TABS: 200.0000 mg | ORAL_TABLET | Freq: Two times a day (BID) | ORAL | 0 refills | Status: DC

## 2020-06-08 MED ORDER — HYDROCHLOROTHIAZIDE 12.5 MG PO CAPS
ORAL_CAPSULE | ORAL | 1 refills | Status: DC
Start: 2020-06-08 — End: 2020-08-17

## 2020-06-08 NOTE — Assessment & Plan Note (Signed)
On Breo 

## 2020-06-08 NOTE — Assessment & Plan Note (Signed)
Cont w/HCTZ

## 2020-06-08 NOTE — Progress Notes (Signed)
Subjective:  Patient ID: Alexandra [name], female    DOB: [birth year]  Age: 55 y.o. MRN: [id number]  CC: Follow-up (6 MONTH F/U- Req medication refills)   HPI Alexandra [name] presents for DM, HTN, neuropathy f/u  Outpatient Medications Prior to Visit  Medication Sig Dispense Refill  . albuterol (VENTOLIN HFA) 108 (90 Base) MCG/ACT inhaler Inhale 1 puff into the lungs every 4 (four) hours as needed. 18 g 3  . Cholecalciferol 1000 UNITS tablet Take 1 tablet (1,000 Units total) by mouth daily. 100 tablet 3  . cyclobenzaprine (FLEXERIL) 10 MG tablet Take 1 tablet (10 mg total) by mouth 2 (two) times daily as needed. 90 tablet 1  . famotidine (PEPCID) 40 MG tablet Take 1 tablet (40 mg total) by mouth daily. 90 tablet 3  . fluticasone furoate-vilanterol (BREO ELLIPTA) 100-25 MCG/INH AEPB Inhale 1 puff into the lungs 2 (two) times daily. 3 each 3  . gabapentin (NEURONTIN) 300 MG capsule TAKE 1 CAPSULE BY MOUTH THREE TIMES DAILY 90 capsule 2  . Ginkgo Biloba 60 MG CAPS Take 2 capsules by mouth daily.    Marland Kitchen glucose blood (ONETOUCH VERIO) test strip And lancets 1/day 100 each 3  . ketoconazole (NIZORAL) 2 % cream Apply 1 application topically daily. 60 g 1  . mometasone (ELOCON) 0.1 % cream Apply 1 application topically 2 (two) times daily. 45 g 0  . triamcinolone ointment (KENALOG) 0.1 % Apply 1 application topically 2 (two) times daily. Only for occasional use, don't use for more than 1-2 weeks at a time 30 g 0  . bromocriptine (PARLODEL) 2.5 MG tablet Take 1 tablet (2.5 mg total) by mouth daily. 90 tablet 3  . hydrochlorothiazide (MICROZIDE) 12.5 MG capsule Take 1 capsule by mouth once daily in the morning 90 capsule 3  . ibuprofen (ADVIL) 600 MG tablet Take 1 tablet (600 mg total) by mouth every 6 (six) hours as needed. 90 tablet 1  . loratadine (CLARITIN) 10 MG tablet TAKE 1 TABLET BY MOUTH ONCE DAILY AS NEEDED FOR ALLERGIES 100 tablet 3  . metFORMIN (GLUCOPHAGE) 1000 MG tablet Take 1  tablet (1,000 mg total) by mouth 2 (two) times daily with a meal. 180 tablet 3  . pioglitazone (ACTOS) 45 MG tablet Take 1 tablet (45 mg total) by mouth daily. 90 tablet 3  . promethazine (PHENERGAN) 25 MG tablet Take 1 tablet (25 mg total) by mouth 2 (two) times daily as needed for nausea. (Patient not taking: Reported on 06/08/2020) 60 tablet 2   No facility-administered medications prior to visit.    ROS: Review of Systems  Constitutional: Negative for activity change, appetite change, chills, fatigue and unexpected weight change.  HENT: Negative for congestion, mouth sores and sinus pressure.   Eyes: Negative for visual disturbance.  Respiratory: Negative for cough and chest tightness.   Gastrointestinal: Negative for abdominal pain and nausea.  Genitourinary: Negative for difficulty urinating, frequency and vaginal pain.  Musculoskeletal: Negative for back pain and gait problem.  Skin: Positive for rash. Negative for pallor.  Neurological: Negative for dizziness, tremors, weakness, numbness and headaches.  Psychiatric/Behavioral: Negative for confusion and sleep disturbance.    Objective:  BP 122/84 (BP Location: Left Arm)   Pulse 89   Temp 98.5 F (36.9 C) (Oral)   Ht 5\' 8"  (1.727 m)   Wt 183 lb 6.4 oz (83.2 kg)   SpO2 97%   BMI 27.89 kg/m   BP Readings from Last 3 Encounters:  06/08/20 122/84  12/10/19 (!) 144/88  06/05/19 132/80    Wt Readings from Last 3 Encounters:  06/08/20 183 lb 6.4 oz (83.2 kg)  12/10/19 186 lb (84.4 kg)  06/05/19 181 lb 6.4 oz (82.3 kg)    Physical Exam Constitutional:      General: She is not in acute distress.    Appearance: She is well-developed.  HENT:     Head: Normocephalic.     Right Ear: External ear normal.     Left Ear: External ear normal.     Nose: Nose normal.  Eyes:     General:        Right eye: No discharge.        Left eye: No discharge.     Conjunctiva/sclera: Conjunctivae normal.     Pupils: Pupils are equal,  round, and reactive to light.  Neck:     Thyroid: No thyromegaly.     Vascular: No JVD.     Trachea: No tracheal deviation.  Cardiovascular:     Rate and Rhythm: Normal rate and regular rhythm.     Heart sounds: Normal heart sounds.  Pulmonary:     Effort: No respiratory distress.     Breath sounds: No stridor. No wheezing.  Abdominal:     General: Bowel sounds are normal. There is no distension.     Palpations: Abdomen is soft. There is no mass.     Tenderness: There is no abdominal tenderness. There is no guarding or rebound.  Musculoskeletal:        General: No tenderness.     Cervical back: Normal range of motion and neck supple.  Lymphadenopathy:     Cervical: No cervical adenopathy.  Skin:    Findings: No erythema or rash.  Neurological:     Mental Status: She is oriented to person, place, and time.     Cranial Nerves: No cranial nerve deficit.     Motor: No abnormal muscle tone.     Coordination: Coordination normal.     Gait: Gait abnormal.     Deep Tendon Reflexes: Reflexes normal.  Psychiatric:        Behavior: Behavior normal.        Thought Content: Thought content normal.        Judgment: Judgment normal.    L foot - blister on toe #3 scaly soles, 1 crack  Lab Results  Component Value Date   WBC 5.2 09/04/2018   HGB 13.1 09/04/2018   HCT 40.2 09/04/2018   PLT 385.0 09/04/2018   GLUCOSE 101 (H) 12/10/2019   CHOL 172 09/04/2018   TRIG 80.0 09/04/2018   HDL 57.50 09/04/2018   LDLDIRECT 122.0 03/03/2008   LDLCALC 99 09/04/2018   ALT 28 12/10/2019   AST 31 12/10/2019   NA 141 12/10/2019   K 4.4 12/10/2019   CL 99 12/10/2019   CREATININE 0.66 12/10/2019   BUN 10 12/10/2019   CO2 26 12/10/2019   TSH 2.24 12/10/2019   HGBA1C 7.0 (H) 12/10/2019   MICROALBUR <0.7 01/27/2017    MM DIGITAL SCREENING BILATERAL  Result Date: 03/11/2020 CLINICAL DATA:  Screening. EXAM: DIGITAL SCREENING BILATERAL MAMMOGRAM WITH CAD COMPARISON:  Previous exam(s). ACR  Breast Density Category c: The breast tissue is heterogeneously dense, which may obscure small masses. FINDINGS: There are no findings suspicious for malignancy. Images were processed with CAD. IMPRESSION: No mammographic evidence of malignancy. A result letter of this screening mammogram will be mailed directly to the patient. RECOMMENDATION: Screening mammogram in  one year. (Code:SM-B-01Y) BI-RADS CATEGORY  1: Negative. Electronically Signed   By: Marin Olp M.D.   On: 03/11/2020 08:22    Assessment & Plan:   There are no diagnoses linked to this encounter.   Meds ordered this encounter  Medications  . bromocriptine (PARLODEL) 2.5 MG tablet    Sig: Take 1 tablet (2.5 mg total) by mouth daily.    Dispense:  90 tablet    Refill:  3  . loratadine (CLARITIN) 10 MG tablet    Sig: TAKE 1 TABLET BY MOUTH ONCE DAILY AS NEEDED FOR ALLERGIES    Dispense:  100 tablet    Refill:  3  . metFORMIN (GLUCOPHAGE) 1000 MG tablet    Sig: Take 1 tablet (1,000 mg total) by mouth 2 (two) times daily with a meal.    Dispense:  180 tablet    Refill:  3  . pioglitazone (ACTOS) 45 MG tablet    Sig: Take 1 tablet (45 mg total) by mouth daily.    Dispense:  90 tablet    Refill:  3  . hydrochlorothiazide (MICROZIDE) 12.5 MG capsule    Sig: Take 1 capsule by mouth once daily in the morning    Dispense:  90 capsule    Refill:  1  . ibuprofen (ADVIL) 600 MG tablet    Sig: Take 1 tablet (600 mg total) by mouth every 6 (six) hours as needed.    Dispense:  90 tablet    Refill:  3     Follow-up: No follow-ups on file.  Walker Kehr, MD

## 2020-06-08 NOTE — Patient Instructions (Signed)
Wax removing kit

## 2020-06-08 NOTE — Assessment & Plan Note (Signed)
On Actos, Parlodel, Metformin Check A1c

## 2020-06-20 ENCOUNTER — Other Ambulatory Visit: Payer: Self-pay | Admitting: Internal Medicine

## 2020-06-20 NOTE — Assessment & Plan Note (Signed)
Continue with vitamin B12 

## 2020-06-20 NOTE — Assessment & Plan Note (Signed)
Continue with good diabetes control

## 2020-08-16 ENCOUNTER — Other Ambulatory Visit: Payer: Self-pay | Admitting: Internal Medicine

## 2020-08-18 IMAGING — MG DIGITAL SCREENING BILAT W/ CAD
4 series · 4 of 4 positions shown · non-contrast
Comparison: Previous exam(s).

CLINICAL DATA: Screening.

EXAM:
DIGITAL SCREENING BILATERAL MAMMOGRAM WITH CAD

[L CC]
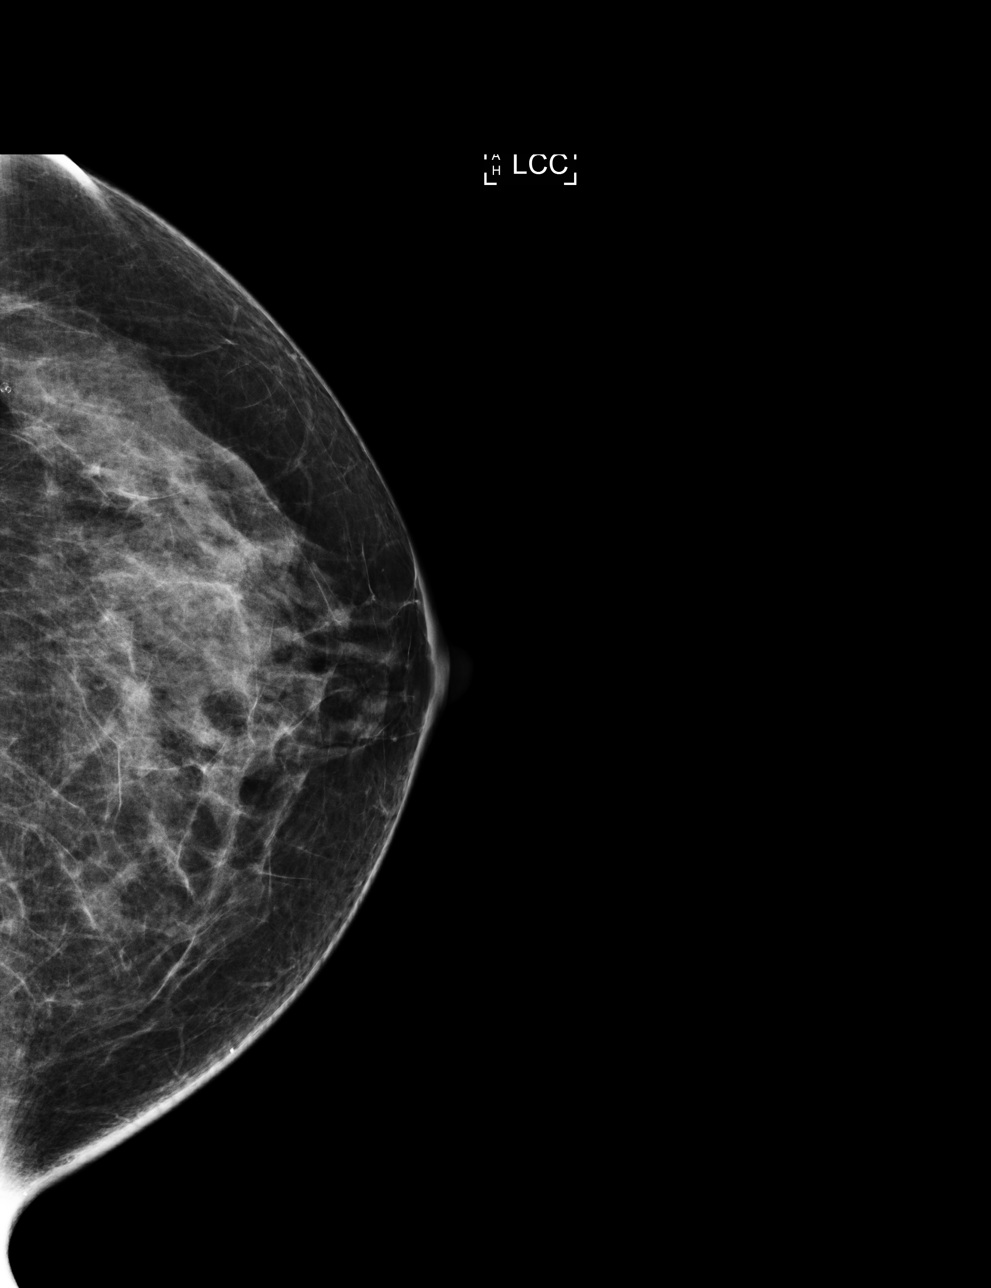

[R CC]
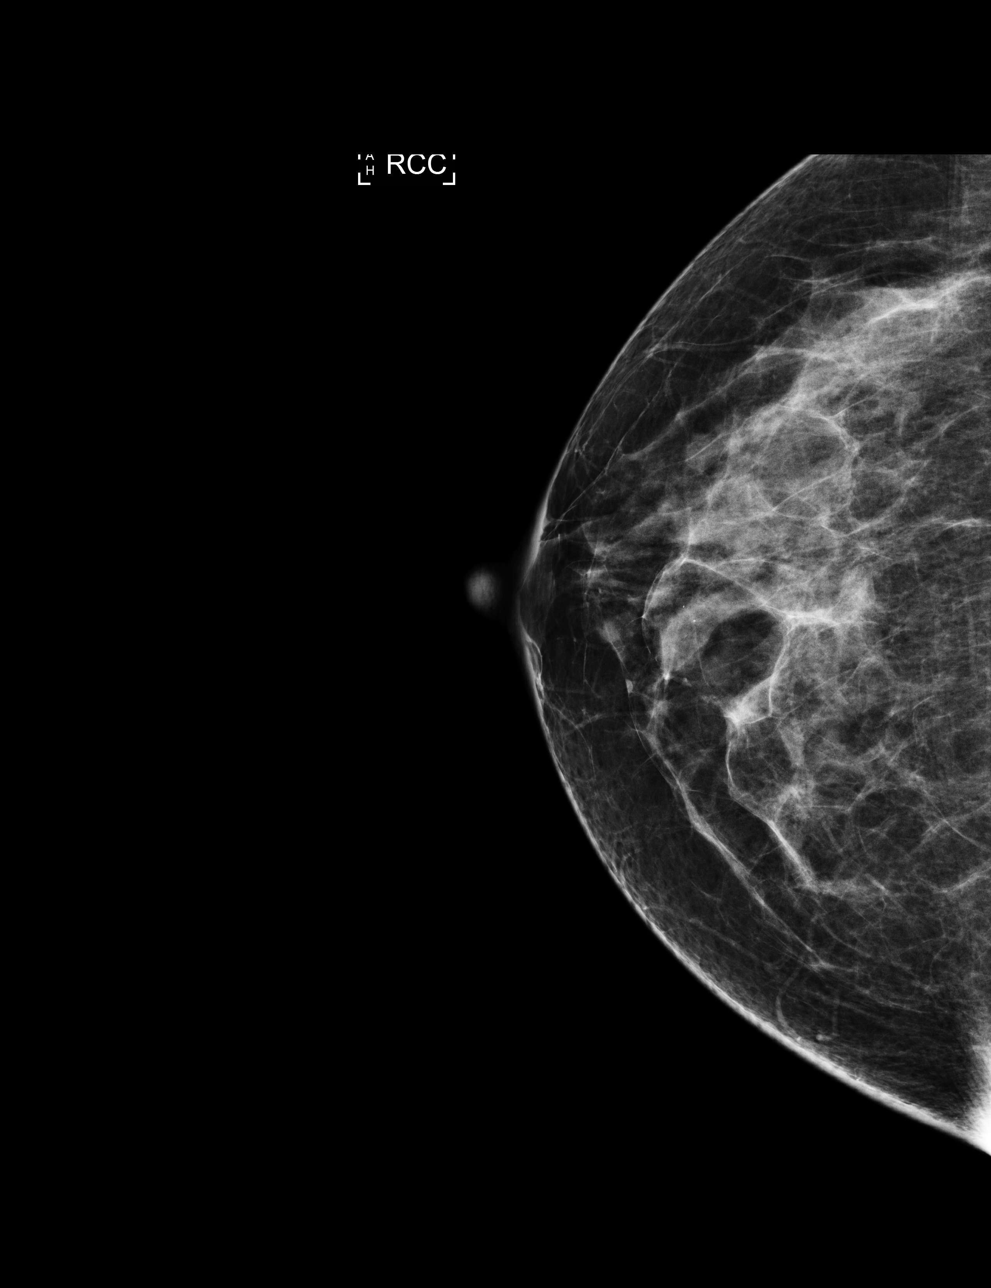

[R MLO]
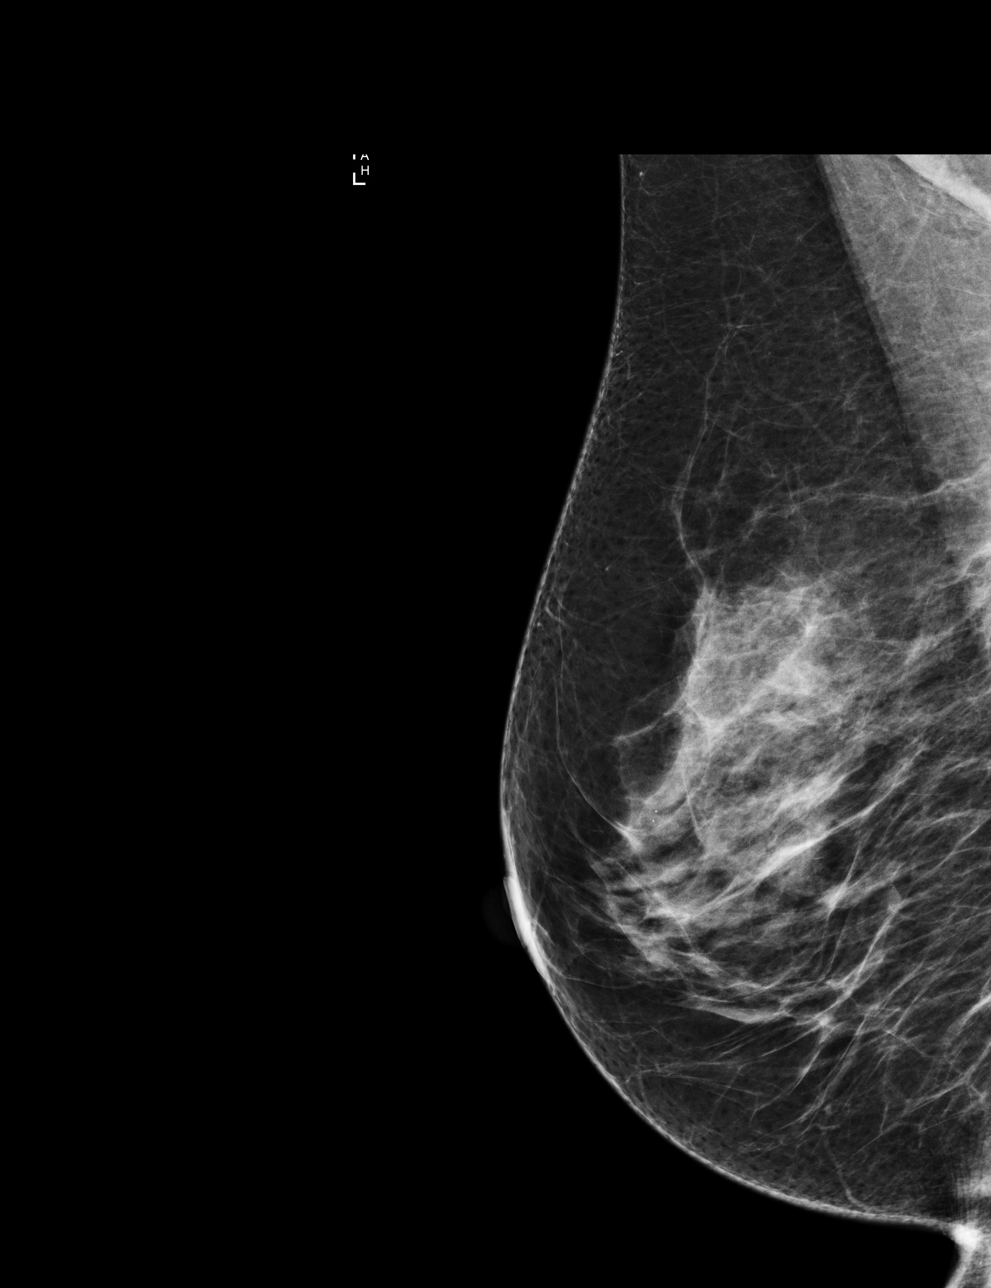

[L MLO]
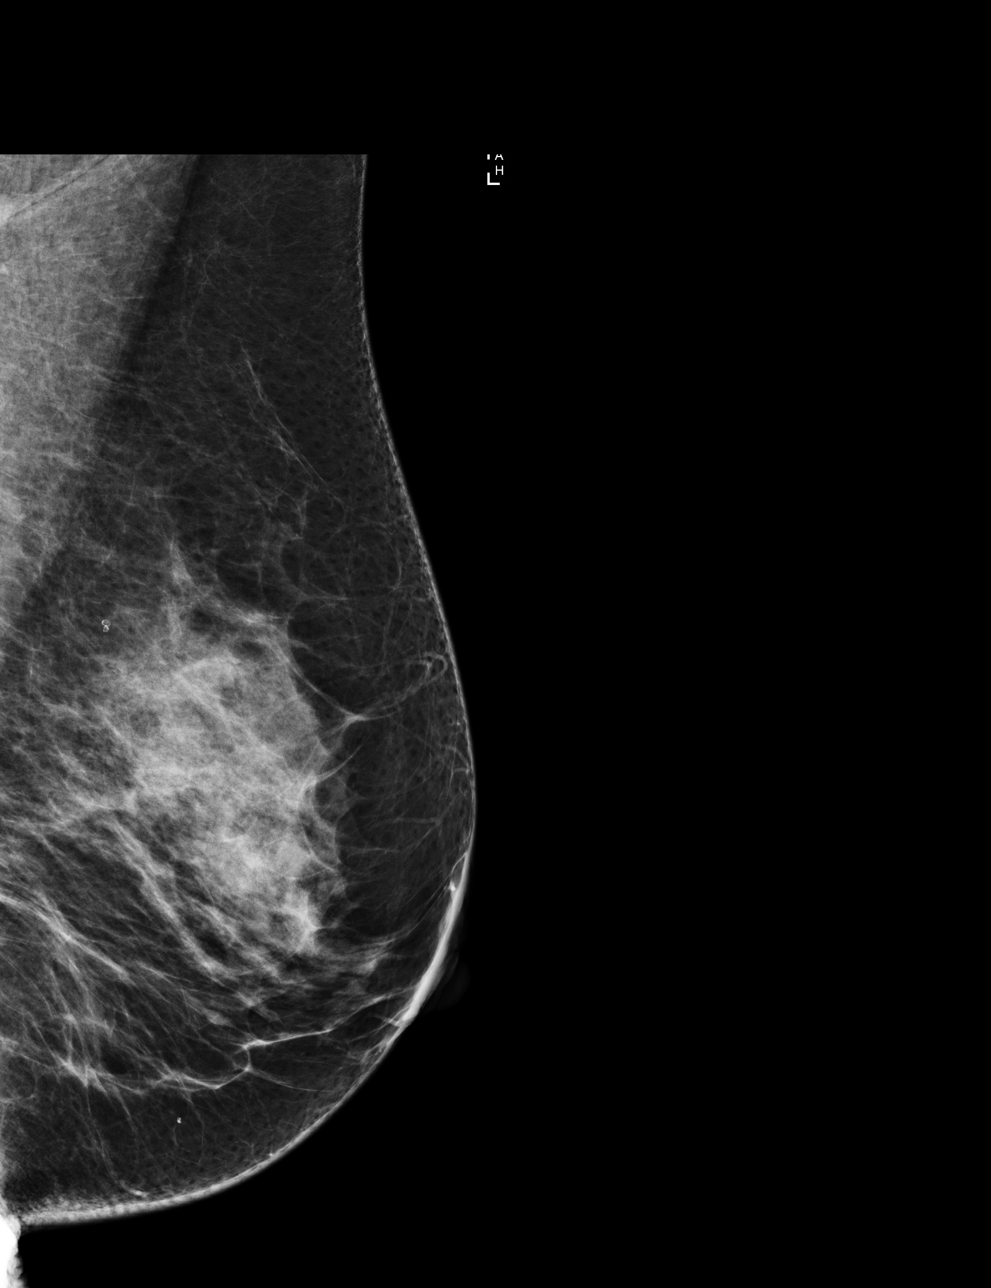

[4 of 4 positions shown; findings below may reference images not displayed]

ACR Breast Density Category c: The breast tissue is heterogeneously
dense, which may obscure small masses.
FINDINGS: There are no findings suspicious for malignancy. Images were
processed with CAD.
IMPRESSION: No mammographic evidence of malignancy. A result letter of this
screening mammogram will be mailed directly to the patient.

RECOMMENDATION:
Screening mammogram in one year. (Code:YJ-2-FEZ)

BI-RADS CATEGORY  1: Negative.

## 2020-10-24 ENCOUNTER — Other Ambulatory Visit: Payer: Self-pay | Admitting: Internal Medicine

## 2020-10-31 ENCOUNTER — Other Ambulatory Visit: Payer: Self-pay | Admitting: Internal Medicine

## 2020-12-07 ENCOUNTER — Ambulatory Visit (INDEPENDENT_AMBULATORY_CARE_PROVIDER_SITE_OTHER): Payer: Managed Care, Other (non HMO) | Admitting: Internal Medicine

## 2020-12-07 ENCOUNTER — Other Ambulatory Visit (INDEPENDENT_AMBULATORY_CARE_PROVIDER_SITE_OTHER): Payer: Managed Care, Other (non HMO)

## 2020-12-07 ENCOUNTER — Other Ambulatory Visit: Payer: Self-pay

## 2020-12-07 ENCOUNTER — Encounter: Payer: Self-pay | Admitting: Internal Medicine

## 2020-12-07 VITALS — BP 120/82 | HR 103 | Temp 98.3°F | Ht 68.0 in | Wt 182.4 lb

## 2020-12-07 DIAGNOSIS — R7989 Other specified abnormal findings of blood chemistry: Secondary | ICD-10-CM

## 2020-12-07 DIAGNOSIS — E119 Type 2 diabetes mellitus without complications: Secondary | ICD-10-CM

## 2020-12-07 DIAGNOSIS — Z23 Encounter for immunization: Secondary | ICD-10-CM | POA: Diagnosis not present

## 2020-12-07 DIAGNOSIS — Z0000 Encounter for general adult medical examination without abnormal findings: Secondary | ICD-10-CM | POA: Diagnosis not present

## 2020-12-07 DIAGNOSIS — E1140 Type 2 diabetes mellitus with diabetic neuropathy, unspecified: Secondary | ICD-10-CM | POA: Diagnosis not present

## 2020-12-07 DIAGNOSIS — E538 Deficiency of other specified B group vitamins: Secondary | ICD-10-CM | POA: Diagnosis not present

## 2020-12-07 DIAGNOSIS — E785 Hyperlipidemia, unspecified: Secondary | ICD-10-CM | POA: Diagnosis not present

## 2020-12-07 DIAGNOSIS — I10 Essential (primary) hypertension: Secondary | ICD-10-CM

## 2020-12-07 LAB — CBC WITH DIFFERENTIAL/PLATELET
Basophils Absolute: 0 10*3/uL (ref 0.0–0.1)
Basophils Relative: 0.4 % (ref 0.0–3.0)
Eosinophils Absolute: 0.1 10*3/uL (ref 0.0–0.7)
Eosinophils Relative: 1.2 % (ref 0.0–5.0)
HCT: 39.8 % (ref 36.0–46.0)
Hemoglobin: 13 g/dL (ref 12.0–15.0)
Lymphocytes Relative: 46 % (ref 12.0–46.0)
Lymphs Abs: 2.8 10*3/uL (ref 0.7–4.0)
MCHC: 32.6 g/dL (ref 30.0–36.0)
MCV: 96.9 fl (ref 78.0–100.0)
Monocytes Absolute: 0.5 10*3/uL (ref 0.1–1.0)
Monocytes Relative: 7.9 % (ref 3.0–12.0)
Neutro Abs: 2.8 10*3/uL (ref 1.4–7.7)
Neutrophils Relative %: 44.5 % (ref 43.0–77.0)
Platelets: 368 10*3/uL (ref 150.0–400.0)
RBC: 4.11 Mil/uL (ref 3.87–5.11)
RDW: 14.3 % (ref 11.5–15.5)
WBC: 6.2 10*3/uL (ref 4.0–10.5)

## 2020-12-07 LAB — COMPREHENSIVE METABOLIC PANEL
ALT: 17 U/L (ref 0–35)
AST: 19 U/L (ref 0–37)
Albumin: 4.3 g/dL (ref 3.5–5.2)
Alkaline Phosphatase: 85 U/L (ref 39–117)
BUN: 17 mg/dL (ref 6–23)
CO2: 32 mEq/L (ref 19–32)
Calcium: 9.9 mg/dL (ref 8.4–10.5)
Chloride: 99 mEq/L (ref 96–112)
Creatinine, Ser: 0.81 mg/dL (ref 0.40–1.20)
GFR: 82.14 mL/min (ref >60.00)
Glucose, Bld: 127 mg/dL — ABNORMAL HIGH (ref 70–99)
Potassium: 3.8 mEq/L (ref 3.5–5.1)
Sodium: 141 mEq/L (ref 135–145)
Total Bilirubin: 0.3 mg/dL (ref 0.2–1.2)
Total Protein: 7.9 g/dL (ref 6.0–8.3)

## 2020-12-07 LAB — LIPID PANEL
Cholesterol: 193 mg/dL (ref 0–200)
HDL: 55.1 mg/dL
LDL Cholesterol: 116 mg/dL — ABNORMAL HIGH (ref 0–99)
NonHDL: 137.67
Total CHOL/HDL Ratio: 3
Triglycerides: 107 mg/dL (ref 0.0–149.0)
VLDL: 21.4 mg/dL (ref 0.0–40.0)

## 2020-12-07 LAB — TSH: TSH: 7.08 u[IU]/mL — ABNORMAL HIGH (ref 0.35–5.50)

## 2020-12-07 LAB — HEMOGLOBIN A1C: Hgb A1c MFr Bld: 7.3 % — ABNORMAL HIGH (ref 4.6–6.5)

## 2020-12-07 MED ORDER — FLUTICASONE FUROATE-VILANTEROL 100-25 MCG/INH IN AEPB: 1.0000 | INHALATION_SPRAY | Freq: Two times a day (BID) | RESPIRATORY_TRACT | 3 refills | Status: DC

## 2020-12-07 MED ORDER — CYCLOBENZAPRINE HCL 10 MG PO TABS: 10.0000 mg | ORAL_TABLET | Freq: Two times a day (BID) | ORAL | 1 refills | Status: DC | PRN

## 2020-12-07 MED ORDER — METFORMIN HCL 1000 MG PO TABS: 1000.0000 mg | ORAL_TABLET | Freq: Two times a day (BID) | ORAL | 3 refills | Status: DC

## 2020-12-07 MED ORDER — PIOGLITAZONE HCL 45 MG PO TABS: 45.0000 mg | ORAL_TABLET | Freq: Every day | ORAL | 3 refills | Status: DC

## 2020-12-07 MED ORDER — FAMOTIDINE 40 MG PO TABS: 40.0000 mg | ORAL_TABLET | Freq: Every day | ORAL | 3 refills | Status: DC

## 2020-12-07 MED ORDER — ALBUTEROL SULFATE HFA 108 (90 BASE) MCG/ACT IN AERS: 1.0000 | INHALATION_SPRAY | RESPIRATORY_TRACT | 1 refills | Status: DC | PRN

## 2020-12-07 MED ORDER — HYDROCHLOROTHIAZIDE 12.5 MG PO CAPS: 12.5000 mg | ORAL_CAPSULE | Freq: Every morning | ORAL | 3 refills | Status: DC

## 2020-12-07 NOTE — Progress Notes (Signed)
Subjective:  Patient ID: Alexandra [name], female    DOB: [birth year]  Age: 55 y.o. MRN: [id number]  CC: Annual Exam (Flu shot) and Medication Refill (On all meds.Marland Kitchen)   HPI Alexandra [name] presents for a well exam  Outpatient Medications Prior to Visit  Medication Sig Dispense Refill   bromocriptine (PARLODEL) 2.5 MG tablet Take 1 tablet (2.5 mg total) by mouth daily. 90 tablet 3   Cholecalciferol 1000 UNITS tablet Take 1 tablet (1,000 Units total) by mouth daily. 100 tablet 3   gabapentin (NEURONTIN) 300 MG capsule TAKE 1 CAPSULE BY MOUTH THREE TIMES DAILY 90 capsule 5   Ginkgo Biloba 60 MG CAPS Take 2 capsules by mouth daily.     glucose blood (ONETOUCH VERIO) test strip And lancets 1/day 100 each 3   ibuprofen (ADVIL) 600 MG tablet Take 1 tablet (600 mg total) by mouth every 6 (six) hours as needed. 90 tablet 3   ketoconazole (NIZORAL) 2 % cream Apply 1 application topically daily. 60 g 1   loratadine (CLARITIN) 10 MG tablet TAKE 1 TABLET BY MOUTH ONCE DAILY AS NEEDED FOR ALLERGIES 100 tablet 3   triamcinolone ointment (KENALOG) 0.5 % Apply 1 application topically 3 (three) times daily. Apply on a blister, skin crack 45 g 1   albuterol (VENTOLIN HFA) 108 (90 Base) MCG/ACT inhaler Inhale 1 puff into the lungs every 4 (four) hours as needed. 18 g 3   cyclobenzaprine (FLEXERIL) 10 MG tablet Take 1 tablet (10 mg total) by mouth 2 (two) times daily as needed. 90 tablet 1   famotidine (PEPCID) 40 MG tablet Take 1 tablet (40 mg total) by mouth daily. 90 tablet 3   fluticasone furoate-vilanterol (BREO ELLIPTA) 100-25 MCG/INH AEPB Inhale 1 puff into the lungs 2 (two) times daily. 3 each 3   hydrochlorothiazide (MICROZIDE) 12.5 MG capsule Take 1 capsule by mouth once daily in the morning 90 capsule 3   ketoconazole (NIZORAL) 200 MG tablet Take 1 tablet (200 mg total) by mouth in the morning and at bedtime. 28 tablet 0   metFORMIN (GLUCOPHAGE) 1000 MG tablet Take 1 tablet (1,000 mg total) by  mouth 2 (two) times daily with a meal. 180 tablet 3   pioglitazone (ACTOS) 45 MG tablet Take 1 tablet (45 mg total) by mouth daily. 90 tablet 3   No facility-administered medications prior to visit.    ROS: Review of Systems  Constitutional:  Negative for activity change, appetite change, chills, fatigue and unexpected weight change.  HENT:  Negative for congestion, mouth sores and sinus pressure.   Eyes:  Negative for visual disturbance.  Respiratory:  Negative for cough and chest tightness.   Gastrointestinal:  Negative for abdominal pain and nausea.  Genitourinary:  Negative for difficulty urinating, frequency and vaginal pain.  Musculoskeletal:  Negative for back pain and gait problem.  Skin:  Positive for rash. Negative for pallor.  Neurological:  Negative for dizziness, tremors, weakness, numbness and headaches.  Psychiatric/Behavioral:  Negative for confusion and sleep disturbance.    Objective:  BP 120/82 (BP Location: Left Arm)   Pulse (!) 103   Temp 98.3 F (36.8 C) (Oral)   Ht '5\' 8"'$  (1.727 m)   Wt 182 lb 6.4 oz (82.7 kg)   SpO2 94%   BMI 27.73 kg/m   BP Readings from Last 3 Encounters:  12/07/20 120/82  06/08/20 122/84  12/10/19 (!) 144/88    Wt Readings from Last 3 Encounters:  12/07/20 182 lb 6.4  oz (82.7 kg)  06/08/20 183 lb 6.4 oz (83.2 kg)  12/10/19 186 lb (84.4 kg)    Physical Exam Constitutional:      General: She is not in acute distress.    Appearance: She is well-developed.  HENT:     Head: Normocephalic.     Right Ear: External ear normal.     Left Ear: External ear normal.     Nose: Nose normal.  Eyes:     General:        Right eye: No discharge.        Left eye: No discharge.     Conjunctiva/sclera: Conjunctivae normal.     Pupils: Pupils are equal, round, and reactive to light.  Neck:     Thyroid: No thyromegaly.     Vascular: No JVD.     Trachea: No tracheal deviation.  Cardiovascular:     Rate and Rhythm: Normal rate and regular  rhythm.     Heart sounds: Normal heart sounds.  Pulmonary:     Effort: No respiratory distress.     Breath sounds: No stridor. No wheezing.  Abdominal:     General: Bowel sounds are normal. There is no distension.     Palpations: Abdomen is soft. There is no mass.     Tenderness: There is no abdominal tenderness. There is no guarding or rebound.  Musculoskeletal:        General: No tenderness.     Cervical back: Normal range of motion and neck supple. No rigidity.  Lymphadenopathy:     Cervical: No cervical adenopathy.  Skin:    Findings: No erythema or rash.  Neurological:     Cranial Nerves: No cranial nerve deficit.     Motor: No abnormal muscle tone.     Coordination: Coordination normal.     Deep Tendon Reflexes: Reflexes normal.  Psychiatric:        Behavior: Behavior normal.        Thought Content: Thought content normal.        Judgment: Judgment normal.    Lab Results  Component Value Date   WBC 6.2 12/07/2020   HGB 13.0 12/07/2020   HCT 39.8 12/07/2020   PLT 368.0 12/07/2020   GLUCOSE 127 (H) 12/07/2020   CHOL 193 12/07/2020   TRIG 107.0 12/07/2020   HDL 55.10 12/07/2020   LDLDIRECT 122.0 03/03/2008   LDLCALC 116 (H) 12/07/2020   ALT 17 12/07/2020   AST 19 12/07/2020   NA 141 12/07/2020   K 3.8 12/07/2020   CL 99 12/07/2020   CREATININE 0.81 12/07/2020   BUN 17 12/07/2020   CO2 32 12/07/2020   TSH 7.08 (H) 12/07/2020   HGBA1C 7.3 (H) 12/07/2020   MICROALBUR <0.7 12/07/2020    MM DIGITAL SCREENING BILATERAL  Result Date: 03/11/2020 CLINICAL DATA:  Screening. EXAM: DIGITAL SCREENING BILATERAL MAMMOGRAM WITH CAD COMPARISON:  Previous exam(s). ACR Breast Density Category c: The breast tissue is heterogeneously dense, which may obscure small masses. FINDINGS: There are no findings suspicious for malignancy. Images were processed with CAD. IMPRESSION: No mammographic evidence of malignancy. A result letter of this screening mammogram will be mailed directly  to the patient. RECOMMENDATION: Screening mammogram in one year. (Code:SM-B-01Y) BI-RADS CATEGORY  1: Negative. Electronically Signed   By: Marin Olp M.D.   On: 03/11/2020 08:22    Assessment & Plan:   Problem List Items Addressed This Visit     Abnormal TSH    Check TSH, FT4, FT3  in 3 months      Relevant Orders   T3, free   T4, free   TSH   Comprehensive metabolic panel   DM2 (diabetes mellitus, type 2) (HCC)    Actos, Parlodel, Metformin CT ca scoring info given      Relevant Medications   metFORMIN (GLUCOPHAGE) 1000 MG tablet   pioglitazone (ACTOS) 45 MG tablet   Other Relevant Orders   Hemoglobin A1c   Comprehensive metabolic panel   CT CARDIAC SCORING (SELF PAY ONLY)   Essential hypertension    On HCTZ      Relevant Medications   hydrochlorothiazide (MICROZIDE) 12.5 MG capsule   Other Relevant Orders   CT CARDIAC SCORING (SELF PAY ONLY)   Vitamin B12 deficiency    Check B12 level On Vit B12      Well adult exam - Primary     We discussed age appropriate health related issues, including available/recomended screening tests and vaccinations. Labs were ordered to be later reviewed . All questions were answered. We discussed one or more of the following - seat belt use, use of sunscreen/sun exposure exercise, fall risk reduction, second hand smoke exposure, firearm use and storage, seat belt use, a need for adhering to healthy diet and exercise. Labs were ordered.  All questions were answered.  Colon 2019 - Dr Carlean Purl. Repeat in 2024 due to polyps CT calc score offered            Follow-up: Return in about 3 months (around 03/08/2021) for a follow-up visit.  Walker Kehr, MD

## 2020-12-07 NOTE — Assessment & Plan Note (Signed)
On HCTZ 

## 2020-12-07 NOTE — Assessment & Plan Note (Signed)
Check TSH, FT4, FT3 in 3 months

## 2020-12-07 NOTE — Assessment & Plan Note (Addendum)
  We discussed age appropriate health related issues, including available/recomended screening tests and vaccinations. Labs were ordered to be later reviewed . All questions were answered. We discussed one or more of the following - seat belt use, use of sunscreen/sun exposure exercise, fall risk reduction, second hand smoke exposure, firearm use and storage, seat belt use, a need for adhering to healthy diet and exercise. Labs were ordered.  All questions were answered.  Colon 2019 - Dr Carlean Purl. Repeat in 2024 due to polyps CT calc score offered

## 2020-12-07 NOTE — Assessment & Plan Note (Signed)
Actos, Parlodel, Metformin CT ca scoring info given

## 2020-12-07 NOTE — Assessment & Plan Note (Signed)
Check B12 level On Vit B12

## 2020-12-07 NOTE — Patient Instructions (Signed)

## 2020-12-08 LAB — URINALYSIS
Bilirubin Urine: NEGATIVE
Hgb urine dipstick: NEGATIVE
Ketones, ur: NEGATIVE
Leukocytes,Ua: NEGATIVE
Nitrite: NEGATIVE
Specific Gravity, Urine: <=1.005 — AB (ref 1.000–1.030)
Total Protein, Urine: NEGATIVE
Urine Glucose: NEGATIVE
Urobilinogen, UA: 0.2 (ref 0.0–1.0)
pH: 6.5 (ref 5.0–8.0)

## 2020-12-08 LAB — MICROALBUMIN / CREATININE URINE RATIO
Creatinine,U: 11.9 mg/dL
Microalb Creat Ratio: 5.9 mg/g (ref 0.0–30.0)
Microalb, Ur: <0.7 mg/dL (ref 0.0–1.9)

## 2021-03-08 ENCOUNTER — Other Ambulatory Visit (INDEPENDENT_AMBULATORY_CARE_PROVIDER_SITE_OTHER): Payer: Managed Care, Other (non HMO)

## 2021-03-08 ENCOUNTER — Ambulatory Visit: Payer: Managed Care, Other (non HMO) | Admitting: Internal Medicine

## 2021-03-08 ENCOUNTER — Encounter: Payer: Self-pay | Admitting: Internal Medicine

## 2021-03-08 ENCOUNTER — Other Ambulatory Visit: Payer: Self-pay

## 2021-03-08 DIAGNOSIS — J4520 Mild intermittent asthma, uncomplicated: Secondary | ICD-10-CM

## 2021-03-08 DIAGNOSIS — I10 Essential (primary) hypertension: Secondary | ICD-10-CM

## 2021-03-08 DIAGNOSIS — E1140 Type 2 diabetes mellitus with diabetic neuropathy, unspecified: Secondary | ICD-10-CM

## 2021-03-08 DIAGNOSIS — R7989 Other specified abnormal findings of blood chemistry: Secondary | ICD-10-CM | POA: Diagnosis not present

## 2021-03-08 DIAGNOSIS — M5416 Radiculopathy, lumbar region: Secondary | ICD-10-CM

## 2021-03-08 DIAGNOSIS — E538 Deficiency of other specified B group vitamins: Secondary | ICD-10-CM

## 2021-03-08 DIAGNOSIS — E119 Type 2 diabetes mellitus without complications: Secondary | ICD-10-CM

## 2021-03-08 LAB — COMPREHENSIVE METABOLIC PANEL
ALT: 22 U/L (ref 0–35)
AST: 23 U/L (ref 0–37)
Albumin: 4.2 g/dL (ref 3.5–5.2)
Alkaline Phosphatase: 72 U/L (ref 39–117)
BUN: 14 mg/dL (ref 6–23)
CO2: 29 mEq/L (ref 19–32)
Calcium: 9.9 mg/dL (ref 8.4–10.5)
Chloride: 101 mEq/L (ref 96–112)
Creatinine, Ser: 0.63 mg/dL (ref 0.40–1.20)
GFR: 100.2 mL/min (ref >60.00)
Glucose, Bld: 130 mg/dL — ABNORMAL HIGH (ref 70–99)
Potassium: 4.1 mEq/L (ref 3.5–5.1)
Sodium: 140 mEq/L (ref 135–145)
Total Bilirubin: 0.3 mg/dL (ref 0.2–1.2)
Total Protein: 7.3 g/dL (ref 6.0–8.3)

## 2021-03-08 LAB — HEMOGLOBIN A1C: Hgb A1c MFr Bld: 7.4 % — ABNORMAL HIGH (ref 4.6–6.5)

## 2021-03-08 LAB — TSH: TSH: 5.62 u[IU]/mL — ABNORMAL HIGH (ref 0.35–5.50)

## 2021-03-08 LAB — T4, FREE: Free T4: 1.04 ng/dL (ref 0.60–1.60)

## 2021-03-08 LAB — T3, FREE: T3, Free: 4 pg/mL (ref 2.3–4.2)

## 2021-03-08 MED ORDER — TRIAMCINOLONE ACETONIDE 0.5 % EX OINT: 1.0000 "application " | TOPICAL_OINTMENT | Freq: Three times a day (TID) | CUTANEOUS | 1 refills | Status: DC

## 2021-03-08 MED ORDER — BROMOCRIPTINE MESYLATE 2.5 MG PO TABS: 2.5000 mg | ORAL_TABLET | Freq: Every day | ORAL | 3 refills | Status: DC

## 2021-03-08 MED ORDER — FLUTICASONE-SALMETEROL 100-50 MCG/ACT IN AEPB: 1.0000 | INHALATION_SPRAY | Freq: Two times a day (BID) | RESPIRATORY_TRACT | 11 refills | Status: DC

## 2021-03-08 MED ORDER — GABAPENTIN 300 MG PO CAPS: 300.0000 mg | ORAL_CAPSULE | Freq: Three times a day (TID) | ORAL | 5 refills | Status: DC

## 2021-03-08 NOTE — Assessment & Plan Note (Signed)
Cont on Actos, Parlodel, Metformin

## 2021-03-08 NOTE — Assessment & Plan Note (Signed)
On B12 

## 2021-03-08 NOTE — Assessment & Plan Note (Signed)
Memory Dance is not covered. Rx  Advair if covered

## 2021-03-08 NOTE — Progress Notes (Signed)
Subjective:  Patient ID: Alexandra [name], female    DOB: [birth year]  Age: 55 y.o. MRN: [id number]  CC: Follow-up (3 month f/u)   HPI Alexandra [name] presents for LBP, DM, HTN, asthma: Memory Dance is not covered  Outpatient Medications Prior to Visit  Medication Sig Dispense Refill   albuterol (VENTOLIN HFA) 108 (90 Base) MCG/ACT inhaler Inhale 1 puff into the lungs every 4 (four) hours as needed. 18 g 1   Cholecalciferol 1000 UNITS tablet Take 1 tablet (1,000 Units total) by mouth daily. 100 tablet 3   cyclobenzaprine (FLEXERIL) 10 MG tablet Take 1 tablet (10 mg total) by mouth 2 (two) times daily as needed. 90 tablet 1   famotidine (PEPCID) 40 MG tablet Take 1 tablet (40 mg total) by mouth daily. 90 tablet 3   Ginkgo Biloba 60 MG CAPS Take 2 capsules by mouth daily.     glucose blood (ONETOUCH VERIO) test strip And lancets 1/day 100 each 3   hydrochlorothiazide (MICROZIDE) 12.5 MG capsule Take 1 capsule (12.5 mg total) by mouth every morning. 90 capsule 3   ibuprofen (ADVIL) 600 MG tablet Take 1 tablet (600 mg total) by mouth every 6 (six) hours as needed. 90 tablet 3   loratadine (CLARITIN) 10 MG tablet TAKE 1 TABLET BY MOUTH ONCE DAILY AS NEEDED FOR ALLERGIES 100 tablet 3   metFORMIN (GLUCOPHAGE) 1000 MG tablet Take 1 tablet (1,000 mg total) by mouth 2 (two) times daily with a meal. 180 tablet 3   pioglitazone (ACTOS) 45 MG tablet Take 1 tablet (45 mg total) by mouth daily. 90 tablet 3   bromocriptine (PARLODEL) 2.5 MG tablet Take 1 tablet (2.5 mg total) by mouth daily. 90 tablet 3   gabapentin (NEURONTIN) 300 MG capsule TAKE 1 CAPSULE BY MOUTH THREE TIMES DAILY 90 capsule 5   triamcinolone ointment (KENALOG) 0.5 % Apply 1 application topically 3 (three) times daily. Apply on a blister, skin crack 45 g 1   fluticasone furoate-vilanterol (BREO ELLIPTA) 100-25 MCG/INH AEPB Inhale 1 puff into the lungs 2 (two) times daily. (Patient not taking: Reported on 03/08/2021) 3 each 3   No  facility-administered medications prior to visit.    ROS: Review of Systems  Constitutional:  Negative for activity change, appetite change, chills, fatigue and unexpected weight change.  HENT:  Negative for congestion, mouth sores and sinus pressure.   Eyes:  Negative for visual disturbance.  Respiratory:  Negative for cough and chest tightness.   Gastrointestinal:  Negative for abdominal pain and nausea.  Genitourinary:  Negative for difficulty urinating, frequency and vaginal pain.  Musculoskeletal:  Positive for back pain. Negative for gait problem.  Skin:  Negative for pallor and rash.  Neurological:  Negative for dizziness, tremors, weakness, numbness and headaches.  Psychiatric/Behavioral:  Negative for confusion and sleep disturbance.    Objective:  BP 128/82 (BP Location: Left Arm)    Pulse 88    Temp 98.3 F (36.8 C) (Oral)    Ht 5\' 8"  (1.727 m)    Wt 182 lb 9.6 oz (82.8 kg)    SpO2 97%    BMI 27.76 kg/m   BP Readings from Last 3 Encounters:  03/08/21 128/82  12/07/20 120/82  06/08/20 122/84    Wt Readings from Last 3 Encounters:  03/08/21 182 lb 9.6 oz (82.8 kg)  12/07/20 182 lb 6.4 oz (82.7 kg)  06/08/20 183 lb 6.4 oz (83.2 kg)    Physical Exam Constitutional:  General: She is not in acute distress.    Appearance: She is well-developed.  HENT:     Head: Normocephalic.     Right Ear: External ear normal.     Left Ear: External ear normal.     Nose: Nose normal.  Eyes:     General:        Right eye: No discharge.        Left eye: No discharge.     Conjunctiva/sclera: Conjunctivae normal.     Pupils: Pupils are equal, round, and reactive to light.  Neck:     Thyroid: No thyromegaly.     Vascular: No JVD.     Trachea: No tracheal deviation.  Cardiovascular:     Rate and Rhythm: Normal rate and regular rhythm.     Heart sounds: Normal heart sounds.  Pulmonary:     Effort: No respiratory distress.     Breath sounds: No stridor. No wheezing.   Abdominal:     General: Bowel sounds are normal. There is no distension.     Palpations: Abdomen is soft. There is no mass.     Tenderness: There is no abdominal tenderness. There is no guarding or rebound.  Musculoskeletal:        General: No tenderness.     Cervical back: Normal range of motion and neck supple. No rigidity.  Lymphadenopathy:     Cervical: No cervical adenopathy.  Skin:    Findings: No erythema or rash.  Neurological:     Cranial Nerves: No cranial nerve deficit.     Motor: No abnormal muscle tone.     Coordination: Coordination normal.     Deep Tendon Reflexes: Reflexes normal.  Psychiatric:        Behavior: Behavior normal.        Thought Content: Thought content normal.        Judgment: Judgment normal.   LS sensitive Lab Results  Component Value Date   WBC 6.2 12/07/2020   HGB 13.0 12/07/2020   HCT 39.8 12/07/2020   PLT 368.0 12/07/2020   GLUCOSE 127 (H) 12/07/2020   CHOL 193 12/07/2020   TRIG 107.0 12/07/2020   HDL 55.10 12/07/2020   LDLDIRECT 122.0 03/03/2008   LDLCALC 116 (H) 12/07/2020   ALT 17 12/07/2020   AST 19 12/07/2020   NA 141 12/07/2020   K 3.8 12/07/2020   CL 99 12/07/2020   CREATININE 0.81 12/07/2020   BUN 17 12/07/2020   CO2 32 12/07/2020   TSH 7.08 (H) 12/07/2020   HGBA1C 7.3 (H) 12/07/2020   MICROALBUR <0.7 12/07/2020    MM DIGITAL SCREENING BILATERAL  Result Date: 03/11/2020 CLINICAL DATA:  Screening. EXAM: DIGITAL SCREENING BILATERAL MAMMOGRAM WITH CAD COMPARISON:  Previous exam(s). ACR Breast Density Category c: The breast tissue is heterogeneously dense, which may obscure small masses. FINDINGS: There are no findings suspicious for malignancy. Images were processed with CAD. IMPRESSION: No mammographic evidence of malignancy. A result letter of this screening mammogram will be mailed directly to the patient. RECOMMENDATION: Screening mammogram in one year. (Code:SM-B-01Y) BI-RADS CATEGORY  1: Negative. Electronically  Signed   By: Marin Olp M.D.   On: 03/11/2020 08:22    Assessment & Plan:   Problem List Items Addressed This Visit     Asthma    Memory Dance is not covered. Rx  Advair if covered      Relevant Medications   fluticasone-salmeterol (ADVAIR) 100-50 MCG/ACT AEPB   DM2 (diabetes mellitus, type 2) ([facility])  Cont on Actos, Parlodel, Metformin      Lumbar radiculopathy    Gabapentin at HS      Relevant Medications   gabapentin (NEURONTIN) 300 MG capsule   bromocriptine (PARLODEL) 2.5 MG tablet   Vitamin B12 deficiency    On B12         Meds ordered this encounter  Medications   gabapentin (NEURONTIN) 300 MG capsule    Sig: Take 1 capsule (300 mg total) by mouth 3 (three) times daily.    Dispense:  90 capsule    Refill:  5   bromocriptine (PARLODEL) 2.5 MG tablet    Sig: Take 1 tablet (2.5 mg total) by mouth daily.    Dispense:  90 tablet    Refill:  3   fluticasone-salmeterol (ADVAIR) 100-50 MCG/ACT AEPB    Sig: Inhale 1 puff into the lungs 2 (two) times daily.    Dispense:  1 each    Refill:  11   triamcinolone ointment (KENALOG) 0.5 %    Sig: Apply 1 application topically 3 (three) times daily. Apply on a blister, skin crack    Dispense:  45 g    Refill:  1       Follow-up: Return in about 3 months (around 06/06/2021) for a follow-up visit.  Walker Kehr, MD

## 2021-03-08 NOTE — Assessment & Plan Note (Signed)
Gabapentin at HS

## 2021-04-02 ENCOUNTER — Other Ambulatory Visit: Payer: Self-pay

## 2021-04-02 ENCOUNTER — Ambulatory Visit (INDEPENDENT_AMBULATORY_CARE_PROVIDER_SITE_OTHER)
Admission: RE | Admit: 2021-04-02 | Discharge: 2021-04-02 | Disposition: A | Discharge status: Home / Self Care | Payer: Self-pay | Source: Ambulatory Visit | Attending: Internal Medicine | Admitting: Internal Medicine

## 2021-04-02 DIAGNOSIS — E1140 Type 2 diabetes mellitus with diabetic neuropathy, unspecified: Secondary | ICD-10-CM

## 2021-04-02 DIAGNOSIS — I10 Essential (primary) hypertension: Secondary | ICD-10-CM

## 2021-04-04 ENCOUNTER — Encounter: Payer: Self-pay | Admitting: Internal Medicine

## 2021-04-04 DIAGNOSIS — I700 Atherosclerosis of aorta: Secondary | ICD-10-CM | POA: Insufficient documentation

## 2021-05-14 ENCOUNTER — Ambulatory Visit (AMBULATORY_SURGERY_CENTER): Payer: Managed Care, Other (non HMO) | Admitting: *Deleted

## 2021-05-14 ENCOUNTER — Other Ambulatory Visit: Payer: Self-pay

## 2021-05-14 VITALS — Ht 68.0 in | Wt 184.0 lb

## 2021-05-14 DIAGNOSIS — Z86010 Personal history of colonic polyps: Secondary | ICD-10-CM

## 2021-05-14 MED ORDER — NA SULFATE-K SULFATE-MG SULF 17.5-3.13-1.6 GM/177ML PO SOLN: 1.0000 | Freq: Once | ORAL | 0 refills | Status: AC

## 2021-05-14 NOTE — Progress Notes (Signed)
No egg or soy allergy known to patient  No issues known to pt with past sedation with any surgeries or procedures Patient denies ever being told they had issues or difficulty with intubation  No FH of Malignant Hyperthermia Pt is not on diet pills Pt is not on  home 02  Pt is not on blood thinners  Pt denies issues with constipation  No A fib or A flutter  Pt is  vaccinated  for Covid   Due to the COVID-19 pandemic we are asking patients to follow certain guidelines in PV and the [facility]   Pt aware of COVID protocols and [facility] guidelines   PV completed over the phone. Pt verified name, DOB, address and insurance during PV today.  Pt mailed instruction packet with copy of consent form to read and not return, and instructions.  Pt encouraged to call with questions or issues.  If pt has My chart, procedure instructions sent via My Chart    Colonoscopy classification sheet mailed with packet.

## 2021-05-25 ENCOUNTER — Encounter: Payer: Self-pay | Admitting: Internal Medicine

## 2021-05-28 ENCOUNTER — Encounter: Payer: Managed Care, Other (non HMO) | Admitting: Internal Medicine

## 2021-06-04 ENCOUNTER — Ambulatory Visit (AMBULATORY_SURGERY_CENTER): Payer: Managed Care, Other (non HMO) | Admitting: Internal Medicine

## 2021-06-04 ENCOUNTER — Encounter: Payer: Self-pay | Admitting: Internal Medicine

## 2021-06-04 VITALS — BP 149/88 | HR 62 | Temp 96.4°F | Resp 16 | Ht 68.0 in | Wt 184.0 lb

## 2021-06-04 DIAGNOSIS — Z86010 Personal history of colonic polyps: Secondary | ICD-10-CM

## 2021-06-04 MED ORDER — SODIUM CHLORIDE 0.9 % IV SOLN: 500.0000 mL | Freq: Once | INTRAVENOUS | Status: DC

## 2021-06-04 NOTE — Op Note (Signed)
[facility] ?Patient Name: Alexandra [name] ?Procedure Date: 06/04/2021 2:10 PM ?MRN: [id number] ?Endoscopist: Gatha Mayer , MD ?Age: 56 ?Referring MD:  ?Date of Birth: 03/11/1966 ?Gender: Female ?Account #: [id number] ?Procedure:                Colonoscopy ?Indications:              Surveillance: Personal history of adenomatous  ?                          polyps on last colonoscopy > 3 years ago, Last  ?                          colonoscopy: 2019 ?Medicines:                Propofol per Anesthesia, Monitored Anesthesia Care ?Procedure:                Pre-Anesthesia Assessment: ?                          - Prior to the procedure, a History and Physical  ?                          was performed, and patient medications and  ?                          allergies were reviewed. The patient's tolerance of  ?                          previous anesthesia was also reviewed. The risks  ?                          and benefits of the procedure and the sedation  ?                          options and risks were discussed with the patient.  ?                          All questions were answered, and informed consent  ?                          was obtained. Prior Anticoagulants: The patient has  ?                          taken no previous anticoagulant or antiplatelet  ?                          agents. ASA Grade Assessment: II - A patient with  ?                          mild systemic disease. After reviewing the risks  ?                          and benefits, the patient was deemed in  ?  satisfactory condition to undergo the procedure. ?                          After obtaining informed consent, the colonoscope  ?                          was passed under direct vision. Throughout the  ?                          procedure, the patient's blood pressure, pulse, and  ?                          oxygen saturations were monitored continuously. The  ?                          Olympus CF-[id number]  (#[id number]) Colonoscope was  ?                          introduced through the anus and advanced to the the  ?                          terminal ileum, with identification of the  ?                          appendiceal orifice and IC valve. The colonoscopy  ?                          was performed without difficulty. The patient  ?                          tolerated the procedure well. The quality of the  ?                          bowel preparation was excellent. The ileocecal  ?                          valve, appendiceal orifice, and rectum were  ?                          photographed. The bowel preparation used was SUPREP  ?                          via split dose instruction. ?Scope In: 2:21:28 PM ?Scope Out: 2:32:03 PM ?Scope Withdrawal Time: 0 hours 8 minutes 4 seconds  ?Total Procedure Duration: 0 hours 10 minutes 35 seconds  ?Findings:                 The perianal and digital rectal examinations were  ?                          normal. ?                          The entire examined colon appeared normal on direct  ?  and retroflexion views. ?Complications:            No immediate complications. ?Estimated Blood Loss:     Estimated blood loss: none. ?Impression:               - The entire examined colon is normal on direct and  ?                          retroflexion views. ?                          - No specimens collected. ?                          - Personal history of colonic polyps. 2 adenomas  ?                          max 10 mm 2019 ?Recommendation:           - Patient has a contact number available for  ?                          emergencies. The signs and symptoms of potential  ?                          delayed complications were discussed with the  ?                          patient. Return to normal activities tomorrow.  ?                          Written discharge instructions were provided to the  ?                          patient. ?                          - Resume  previous diet. ?                          - Continue present medications. ?                          - Repeat colonoscopy in 5 years for surveillance. ?Gatha Mayer, MD ?06/04/2021 2:38:51 PM ?This report has been signed electronically. ?

## 2021-06-04 NOTE — Progress Notes (Signed)
[facility] History and Physical ? ? ?Primary Care Physician:  Cassandria Anger, MD ? ? ?Reason for Procedure:   Hx colon polyps ? ?Plan:    colonoscopy ? ? ? ? ?HPI: Alexandra [name] is a 56 y.o. female here for surveillance colonoscopy ? ? ?Past Medical History:  ?Diagnosis Date  ? ALLERGIC RHINITIS   ? Allergy   ? SEASONAL  ? Asthma   ? CTS (carpal tunnel syndrome)   ? Diabetes mellitus   ? Fibroids   ? uterine  ? Glaucoma   ? ?  DENIES 05/14/21  ? Hx of adenomatous colonic polyps 12/15/2017  ? Hypertension   ? Low back pain   ? Osteoarthritis   ? Polyarthralgia   ? Vitamin B12 deficiency   ? ? ?Past Surgical History:  ?Procedure Laterality Date  ? BACK SURGERY    ? 2011  ? cervical bx    ? COLONOSCOPY    ? POLYPECTOMY    ? ? ?Prior to Admission medications   ?Medication Sig Start Date End Date Taking? Authorizing Provider  ?bromocriptine (PARLODEL) 2.5 MG tablet Take 1 tablet (2.5 mg total) by mouth daily. 03/08/21  Yes Plotnikov, Evie Lacks, MD  ?Cholecalciferol 1000 UNITS tablet Take 1 tablet (1,000 Units total) by mouth daily. 05/13/11  Yes Plotnikov, Evie Lacks, MD  ?famotidine (PEPCID) 40 MG tablet Take 1 tablet (40 mg total) by mouth daily. 12/07/20  Yes Plotnikov, Evie Lacks, MD  ?gabapentin (NEURONTIN) 300 MG capsule Take 1 capsule (300 mg total) by mouth 3 (three) times daily. 03/08/21  Yes Plotnikov, Evie Lacks, MD  ?glucose blood (ONETOUCH VERIO) test strip And lancets 1/day 11/15/16  Yes Renato Shin, MD  ?hydrochlorothiazide (MICROZIDE) 12.5 MG capsule Take 1 capsule (12.5 mg total) by mouth every morning. 12/07/20  Yes Plotnikov, Evie Lacks, MD  ?loratadine (CLARITIN) 10 MG tablet TAKE 1 TABLET BY MOUTH ONCE DAILY AS NEEDED FOR ALLERGIES 11/01/20  Yes Plotnikov, Evie Lacks, MD  ?metFORMIN (GLUCOPHAGE) 1000 MG tablet Take 1 tablet (1,000 mg total) by mouth 2 (two) times daily with a meal. 12/07/20  Yes Plotnikov, Evie Lacks, MD  ?pioglitazone (ACTOS) 45 MG tablet Take 1 tablet (45 mg total)  by mouth daily. 12/07/20  Yes Plotnikov, Evie Lacks, MD  ?triamcinolone ointment (KENALOG) 0.5 % Apply 1 application topically 3 (three) times daily. Apply on a blister, skin crack 03/08/21 03/08/22 Yes Plotnikov, Evie Lacks, MD  ?albuterol (VENTOLIN HFA) 108 (90 Base) MCG/ACT inhaler Inhale 1 puff into the lungs every 4 (four) hours as needed. 12/07/20   Plotnikov, Evie Lacks, MD  ?cyclobenzaprine (FLEXERIL) 10 MG tablet Take 1 tablet (10 mg total) by mouth 2 (two) times daily as needed. 12/07/20   Plotnikov, Evie Lacks, MD  ?fluticasone-salmeterol (ADVAIR) 100-50 MCG/ACT AEPB Inhale 1 puff into the lungs 2 (two) times daily. 03/08/21   Plotnikov, Evie Lacks, MD  ?Ginkgo Biloba 60 MG CAPS Take 2 capsules by mouth daily.    [provider]  ?ibuprofen (ADVIL) 600 MG tablet Take 1 tablet (600 mg total) by mouth every 6 (six) hours as needed. 06/08/20   Plotnikov, Evie Lacks, MD  ? ? ?Current Outpatient Medications  ?Medication Sig Dispense Refill  ? bromocriptine (PARLODEL) 2.5 MG tablet Take 1 tablet (2.5 mg total) by mouth daily. 90 tablet 3  ? Cholecalciferol 1000 UNITS tablet Take 1 tablet (1,000 Units total) by mouth daily. 100 tablet 3  ? famotidine (PEPCID) 40 MG tablet Take 1 tablet (40 mg  total) by mouth daily. 90 tablet 3  ? gabapentin (NEURONTIN) 300 MG capsule Take 1 capsule (300 mg total) by mouth 3 (three) times daily. 90 capsule 5  ? glucose blood (ONETOUCH VERIO) test strip And lancets 1/day 100 each 3  ? hydrochlorothiazide (MICROZIDE) 12.5 MG capsule Take 1 capsule (12.5 mg total) by mouth every morning. 90 capsule 3  ? loratadine (CLARITIN) 10 MG tablet TAKE 1 TABLET BY MOUTH ONCE DAILY AS NEEDED FOR ALLERGIES 100 tablet 3  ? metFORMIN (GLUCOPHAGE) 1000 MG tablet Take 1 tablet (1,000 mg total) by mouth 2 (two) times daily with a meal. 180 tablet 3  ? pioglitazone (ACTOS) 45 MG tablet Take 1 tablet (45 mg total) by mouth daily. 90 tablet 3  ? triamcinolone ointment (KENALOG) 0.5 % Apply 1 application  topically 3 (three) times daily. Apply on a blister, skin crack 45 g 1  ? albuterol (VENTOLIN HFA) 108 (90 Base) MCG/ACT inhaler Inhale 1 puff into the lungs every 4 (four) hours as needed. 18 g 1  ? cyclobenzaprine (FLEXERIL) 10 MG tablet Take 1 tablet (10 mg total) by mouth 2 (two) times daily as needed. 90 tablet 1  ? fluticasone-salmeterol (ADVAIR) 100-50 MCG/ACT AEPB Inhale 1 puff into the lungs 2 (two) times daily. 1 each 11  ? Ginkgo Biloba 60 MG CAPS Take 2 capsules by mouth daily.    ? ibuprofen (ADVIL) 600 MG tablet Take 1 tablet (600 mg total) by mouth every 6 (six) hours as needed. 90 tablet 3  ? ?Current Facility-Administered Medications  ?Medication Dose Route Frequency Provider Last Rate Last Admin  ? 0.9 %  sodium chloride infusion  500 mL Intravenous Once Gatha Mayer, MD      ? ? ?Allergies as of 06/04/2021 - Review Complete 06/04/2021  ?Allergen Reaction Noted  ? Adhesive [tape] Other (See Comments) 01/24/2018  ? Benazepril hcl  03/08/2007  ? Tramadol hcl  10/28/2008  ? ? ?Family History  ?Problem Relation Age of Onset  ? Diabetes Mother   ? Kidney disease Mother 47  ?     ESRD on HD  ? Heart disease Mother   ? Colon polyps Mother   ? Cancer Father   ?     head and neck  ? Hypertension Other   ? Stroke Brother 63  ?     CVAx2 on crack  ? Breast cancer Neg Hx   ? Colitis Neg Hx   ? Esophageal cancer Neg Hx   ? Rectal cancer Neg Hx   ? Stomach cancer Neg Hx   ? ? ?Social History  ? ?Socioeconomic History  ? Marital status: Single  ?  Spouse name: Not on file  ? Number of children: Not on file  ? Years of education: Not on file  ? Highest education level: Not on file  ?Occupational History  ? Occupation: WORKS SECOND SHIFT  ?  Employer: [employer]  ?  Comment: plant worker  ?Tobacco Use  ? Smoking status: Never  ?  Passive exposure: Never  ? Smokeless tobacco: Never  ?Vaping Use  ? Vaping Use: Never used  ?Substance and Sexual Activity  ? Alcohol use: Yes  ?  Comment: occasional wine  ?  Drug use: No  ? Sexual activity: Not Currently  ?  Birth control/protection: None  ?Other Topics Concern  ? ?Review of Systems: ? ?All other review of systems negative except as mentioned in the HPI. ? ?Physical Exam: ?Vital signs ?BP Marland Kitchen)  150/87   Pulse 86   Temp (!) 96.4 ?F (35.8 ?C) (Temporal)   Ht '5\' 8"'$  (1.727 m)   Wt 184 lb (83.5 kg)   SpO2 98%   BMI 27.98 kg/m?  ? ?General:   Alert,  Well-developed, well-nourished, pleasant and cooperative in NAD ?Lungs:  Clear throughout to auscultation.   ?Heart:  Regular rate and rhythm; no murmurs, clicks, rubs,  or gallops. ?Abdomen:  Soft, nontender and nondistended. Normal bowel sounds.   ?Neuro/Psych:  Alert and cooperative. Normal mood and affect. A and O x 3 ? ? ?'@Memorie [place]'$  Simonne Maffucci, MD, [facility] ?[facility] ?[phone] (pager) ?06/04/2021 2:12 PM@ ? ?

## 2021-06-04 NOTE — Progress Notes (Signed)
Pt's states no medical or surgical changes since previsit or office visit. 

## 2021-06-04 NOTE — Patient Instructions (Addendum)
Please read handouts provided. ?Continue present medications. ?Repeat colonoscopy in 5 years for screening. ? ? ?YOU HAD AN ENDOSCOPIC PROCEDURE TODAY AT [facility]:   Refer to the procedure report that was given to you for any specific questions about what was found during the examination.  If the procedure report does not answer your questions, please call your gastroenterologist to clarify.  If you requested that your care partner not be given the details of your procedure findings, then the procedure report has been included in a sealed envelope for you to review at your convenience later. ? ?YOU SHOULD EXPECT: Some feelings of bloating in the abdomen. Passage of more gas than usual.  Walking can help get rid of the air that was put into your GI tract during the procedure and reduce the bloating. If you had a lower endoscopy (such as a colonoscopy or flexible sigmoidoscopy) you may notice spotting of blood in your stool or on the toilet paper. If you underwent a bowel prep for your procedure, you may not have a normal bowel movement for a few days. ? ?Please Note:  You might notice some irritation and congestion in your nose or some drainage.  This is from the oxygen used during your procedure.  There is no need for concern and it should clear up in a day or so. ? ?SYMPTOMS TO REPORT IMMEDIATELY: ? ?Following lower endoscopy (colonoscopy or flexible sigmoidoscopy): ? Excessive amounts of blood in the stool ? Significant tenderness or worsening of abdominal pains ? Swelling of the abdomen that is new, acute ? Fever of 100?F or higher ? ? ?For urgent or emergent issues, a gastroenterologist can be reached at any hour by calling [phone]. ?Do not use MyChart messaging for urgent concerns.  ? ? ?DIET:  We do recommend a small meal at first, but then you may proceed to your regular diet.  Drink plenty of fluids but you should avoid alcoholic beverages for 24 hours. ? ?ACTIVITY:  You should  plan to take it easy for the rest of today and you should NOT DRIVE or use heavy machinery until tomorrow (because of the sedation medicines used during the test).   ? ?FOLLOW UP: ?Our staff will call the number listed on your records 48-72 hours following your procedure to check on you and address any questions or concerns that you may have regarding the information given to you following your procedure. If we do not reach you, we will leave a message.  We will attempt to reach you two times.  During this call, we will ask if you have developed any symptoms of COVID 19. If you develop any symptoms (ie: fever, flu-like symptoms, shortness of breath, cough etc.) before then, please call [phone].  If you test positive for Covid 19 in the 2 weeks post procedure, please call and report this information to [place].   ? ?If any biopsies were taken you will be contacted by phone or by letter within the next 1-3 weeks.  Please call us at [phone] if you have not heard about the biopsies in 3 weeks.  ? ? ?SIGNATURES/CONFIDENTIALITY: ?You and/or your care partner have signed paperwork which will be entered into your electronic medical record.  These signatures attest to the fact that that the information above on your After Visit Summary has been reviewed and is understood.  Full responsibility of the confidentiality of this discharge information lies with you and/or your care-partner. No polyps today! ? ?  Your next routine colonoscopy should be in 5 years - 2028. ? ?I appreciate the opportunity to care for you. ?Gatha Mayer, MD, [facility] ? ?

## 2021-06-04 NOTE — Progress Notes (Signed)
To pacu, VSS.report to Rn.tb 

## 2021-06-07 ENCOUNTER — Other Ambulatory Visit: Payer: Self-pay

## 2021-06-07 ENCOUNTER — Ambulatory Visit: Payer: Managed Care, Other (non HMO) | Admitting: Internal Medicine

## 2021-06-07 ENCOUNTER — Encounter: Payer: Self-pay | Admitting: Internal Medicine

## 2021-06-07 DIAGNOSIS — M5416 Radiculopathy, lumbar region: Secondary | ICD-10-CM

## 2021-06-07 DIAGNOSIS — D259 Leiomyoma of uterus, unspecified: Secondary | ICD-10-CM | POA: Insufficient documentation

## 2021-06-07 DIAGNOSIS — E538 Deficiency of other specified B group vitamins: Secondary | ICD-10-CM | POA: Diagnosis not present

## 2021-06-07 DIAGNOSIS — IMO0002 Reserved for concepts with insufficient information to code with codable children: Secondary | ICD-10-CM | POA: Insufficient documentation

## 2021-06-07 DIAGNOSIS — J4520 Mild intermittent asthma, uncomplicated: Secondary | ICD-10-CM

## 2021-06-07 DIAGNOSIS — E1140 Type 2 diabetes mellitus with diabetic neuropathy, unspecified: Secondary | ICD-10-CM | POA: Diagnosis not present

## 2021-06-07 DIAGNOSIS — N854 Malposition of uterus: Secondary | ICD-10-CM | POA: Insufficient documentation

## 2021-06-07 DIAGNOSIS — Z86010 Personal history of colonic polyps: Secondary | ICD-10-CM

## 2021-06-07 DIAGNOSIS — Z860101 Personal history of adenomatous and serrated colon polyps: Secondary | ICD-10-CM

## 2021-06-07 DIAGNOSIS — M5440 Lumbago with sciatica, unspecified side: Secondary | ICD-10-CM | POA: Diagnosis not present

## 2021-06-07 MED ORDER — ONETOUCH VERIO VI STRP: ORAL_STRIP | 11 refills | Status: DC

## 2021-06-07 MED ORDER — ONETOUCH ULTRASOFT LANCETS MISC: 12 refills | Status: DC

## 2021-06-07 MED ORDER — ONETOUCH VERIO W/DEVICE KIT: 1.0000 [IU] | PACK | Freq: Every day | 1 refills | Status: DC | PRN

## 2021-06-07 NOTE — Assessment & Plan Note (Signed)
Chronic - severe spinal stenosis ?R leg is weaker ? ? ? ?

## 2021-06-07 NOTE — Assessment & Plan Note (Signed)
Gabapentin at HS ?On Gabapentin ?

## 2021-06-07 NOTE — Assessment & Plan Note (Signed)
Chronic. Cont on Symbicort ?

## 2021-06-07 NOTE — Assessment & Plan Note (Signed)
Stable ?Invokana, Janumet - stopped ? Actos, Parlodel ?

## 2021-06-07 NOTE — Progress Notes (Signed)
? ?Subjective:  ?Patient ID: Alexandra [name], female    DOB: 02-22-1966  Age: 56 y.o. MRN: [id number] ? ?CC: No chief complaint on file. ? ? ?HPI ?[name] presents for DM, HTN, LBP f/u ? ?Outpatient Medications Prior to Visit  ?Medication Sig Dispense Refill  ? albuterol (VENTOLIN HFA) 108 (90 Base) MCG/ACT inhaler Inhale 1 puff into the lungs every 4 (four) hours as needed. 18 g 1  ? bromocriptine (PARLODEL) 2.5 MG tablet Take 1 tablet (2.5 mg total) by mouth daily. 90 tablet 3  ? Cholecalciferol 1000 UNITS tablet Take 1 tablet (1,000 Units total) by mouth daily. 100 tablet 3  ? cyclobenzaprine (FLEXERIL) 10 MG tablet Take 1 tablet (10 mg total) by mouth 2 (two) times daily as needed. 90 tablet 1  ? famotidine (PEPCID) 40 MG tablet Take 1 tablet (40 mg total) by mouth daily. 90 tablet 3  ? fluticasone-salmeterol (ADVAIR) 100-50 MCG/ACT AEPB Inhale 1 puff into the lungs 2 (two) times daily. 1 each 11  ? gabapentin (NEURONTIN) 300 MG capsule Take 1 capsule (300 mg total) by mouth 3 (three) times daily. 90 capsule 5  ? Ginkgo Biloba 60 MG CAPS Take 2 capsules by mouth daily.    ? glucose blood (ONETOUCH VERIO) test strip And lancets 1/day 100 each 3  ? hydrochlorothiazide (MICROZIDE) 12.5 MG capsule Take 1 capsule (12.5 mg total) by mouth every morning. 90 capsule 3  ? ibuprofen (ADVIL) 600 MG tablet Take 1 tablet (600 mg total) by mouth every 6 (six) hours as needed. 90 tablet 3  ? loratadine (CLARITIN) 10 MG tablet TAKE 1 TABLET BY MOUTH ONCE DAILY AS NEEDED FOR ALLERGIES 100 tablet 3  ? metFORMIN (GLUCOPHAGE) 1000 MG tablet Take 1 tablet (1,000 mg total) by mouth 2 (two) times daily with a meal. 180 tablet 3  ? pioglitazone (ACTOS) 45 MG tablet Take 1 tablet (45 mg total) by mouth daily. 90 tablet 3  ? triamcinolone ointment (KENALOG) 0.5 % Apply 1 application topically 3 (three) times daily. Apply on a blister, skin crack 45 g 1  ? ?No facility-administered medications prior to visit.   ? ? ?ROS: ?Review of Systems  ?Constitutional:  Negative for activity change, appetite change, chills, fatigue and unexpected weight change.  ?HENT:  Negative for congestion, mouth sores and sinus pressure.   ?Eyes:  Negative for visual disturbance.  ?Respiratory:  Negative for cough and chest tightness.   ?Gastrointestinal:  Negative for abdominal pain and nausea.  ?Genitourinary:  Negative for difficulty urinating, frequency and vaginal pain.  ?Musculoskeletal:  Negative for back pain and gait problem.  ?Skin:  Negative for pallor and rash.  ?Neurological:  Negative for dizziness, tremors, weakness, numbness and headaches.  ?Psychiatric/Behavioral:  Negative for confusion and sleep disturbance.   ? ?Objective:  ?BP 130/78 (BP Location: Left Arm, Patient Position: Sitting, Cuff Size: Large)   Pulse 90   Temp 98.7 ?F (37.1 ?C) (Oral)   Ht '5\' 8"'$  (1.727 m)   Wt 186 lb (84.4 kg)   SpO2 98%   BMI 28.28 kg/m?  ? ?BP Readings from Last 3 Encounters:  ?06/07/21 130/78  ?06/04/21 (!) 149/88  ?03/08/21 128/82  ? ? ?Wt Readings from Last 3 Encounters:  ?06/07/21 186 lb (84.4 kg)  ?06/04/21 184 lb (83.5 kg)  ?05/14/21 184 lb (83.5 kg)  ? ? ?Physical Exam ?Constitutional:   ?   General: She is not in acute distress. ?   Appearance: She is well-developed.  ?  HENT:  ?   Head: Normocephalic.  ?   Right Ear: External ear normal.  ?   Left Ear: External ear normal.  ?   Nose: Nose normal.  ?Eyes:  ?   General:     ?   Right eye: No discharge.     ?   Left eye: No discharge.  ?   Conjunctiva/sclera: Conjunctivae normal.  ?   Pupils: Pupils are equal, round, and reactive to light.  ?Neck:  ?   Thyroid: No thyromegaly.  ?   Vascular: No JVD.  ?   Trachea: No tracheal deviation.  ?Cardiovascular:  ?   Rate and Rhythm: Normal rate and regular rhythm.  ?   Heart sounds: Normal heart sounds.  ?Pulmonary:  ?   Effort: No respiratory distress.  ?   Breath sounds: No stridor. No wheezing.  ?Abdominal:  ?   General: Bowel sounds are  normal. There is no distension.  ?   Palpations: Abdomen is soft. There is no mass.  ?   Tenderness: There is no abdominal tenderness. There is no guarding or rebound.  ?Musculoskeletal:     ?   General: No tenderness.  ?   Cervical back: Normal range of motion and neck supple. No rigidity.  ?Lymphadenopathy:  ?   Cervical: No cervical adenopathy.  ?Skin: ?   Findings: No erythema or rash.  ?Neurological:  ?   Cranial Nerves: No cranial nerve deficit.  ?   Motor: No abnormal muscle tone.  ?   Coordination: Coordination normal.  ?   Deep Tendon Reflexes: Reflexes normal.  ?Psychiatric:     ?   Behavior: Behavior normal.     ?   Thought Content: Thought content normal.     ?   Judgment: Judgment normal.  ? ? ?Lab Results  ?Component Value Date  ? WBC 6.2 12/07/2020  ? HGB 13.0 12/07/2020  ? HCT 39.8 12/07/2020  ? PLT 368.0 12/07/2020  ? GLUCOSE 130 (H) 03/08/2021  ? CHOL 193 12/07/2020  ? TRIG 107.0 12/07/2020  ? HDL 55.10 12/07/2020  ? LDLDIRECT 122.0 03/03/2008  ? LDLCALC 116 (H) 12/07/2020  ? ALT 22 03/08/2021  ? AST 23 03/08/2021  ? NA 140 03/08/2021  ? K 4.1 03/08/2021  ? CL 101 03/08/2021  ? CREATININE 0.63 03/08/2021  ? BUN 14 03/08/2021  ? CO2 29 03/08/2021  ? TSH 5.62 (H) 03/08/2021  ? HGBA1C 7.4 (H) 03/08/2021  ? MICROALBUR <0.7 12/07/2020  ? ? ?CT CARDIAC SCORING (SELF PAY ONLY) ? ?Addendum Date: 04/02/2021   ?ADDENDUM REPORT: 04/02/2021 09:27 CLINICAL DATA:  Cardiovascular Disease Risk stratification EXAM: Coronary Calcium Score TECHNIQUE: A gated, non-contrast computed tomography scan of the heart was performed using 34m slice thickness. Axial images were analyzed on a dedicated workstation. Calcium scoring of the coronary arteries was performed using the Agatston method. FINDINGS: Coronary Calcium Score: Left main: 0 Left anterior descending artery: 0 Left circumflex artery: 0 Right coronary artery: 0 Total: 0 Percentile: 0 Pericardium: Normal. Ascending Aorta: Normal caliber.  Mild aortic atherosclerosis  Non-cardiac: [facility] IMPRESSION: Coronary calcium score of 0. This was 0 percentile for age-, race-, and sex-matched controls. Mild aortic atherosclerosis. RECOMMENDATIONS: Coronary artery calcium (CAC) score is a strong predictor of incident coronary heart disease (CHD) and provides predictive information beyond traditional risk factors. CAC scoring is reasonable to use in the decision to withhold, postpone, or initiate statin therapy in intermediate-risk or selected borderline-risk  asymptomatic adults (age 91-75 years and LDL-C >=70 to <190 mg/dL) who do not have diabetes or established atherosclerotic cardiovascular disease (ASCVD).* In intermediate-risk (10-year ASCVD risk >=7.5% to <20%) adults or selected borderline-risk (10-year ASCVD risk >=5% to <7.5%) adults in whom a CAC score is measured for the purpose of making a treatment decision the following recommendations have been made: If CAC=0, it is reasonable to withhold statin therapy and reassess in 5 to 10 years, as long as higher risk conditions are absent (diabetes mellitus, family history of premature CHD in first degree relatives (males <55 years; females <65 years), cigarette smoking, or LDL >=190 mg/dL). If CAC is 1 to 99, it is reasonable to initiate statin therapy for patients >=67 years of age. If CAC is >=100 or >=75th percentile, it is reasonable to initiate statin therapy at any age. Cardiology referral should be considered for patients with CAC scores >=400 or >=75th percentile. *2018 AHA/ACC/AACVPR/AAPA/ABC/ACPM/ADA/AGS/APhA/ASPC/NLA/PCNA Guideline on the Management of Blood Cholesterol: A Report of the American College of Cardiology/American Heart Association Task Force on Clinical Practice Guidelines. J Am Coll Cardiol. 2019;73(24):3168-3209. Kirk Ruths, MD Electronically Signed   By: Kirk Ruths M.D.   On: 04/02/2021 09:27  ? ?Result Date: 04/02/2021 ?EXAM: OVER-READ INTERPRETATION  CT CHEST  The following report is an over-read performed by radiologist Dr. Vinnie Langton of [facility], [place] on 04/02/2021. This over-read does not include interpretation of cardiac or coronary anatomy or pathology. The coro

## 2021-06-07 NOTE — Assessment & Plan Note (Addendum)
Chronic ?On Vit B12 ?

## 2021-06-07 NOTE — Assessment & Plan Note (Signed)
recall 2028 ?

## 2021-06-08 ENCOUNTER — Telehealth: Payer: Self-pay

## 2021-06-08 NOTE — Telephone Encounter (Signed)
?  Follow up Call- ? ?Call back number 06/04/2021  ?Post procedure Call Back phone  # [phone]  ?Some recent data might be hidden  ?  ? ?Patient questions: ? ?Do you have a fever, pain , or abdominal swelling? No. ?Pain Score  0 * ? ?Have you tolerated food without any problems? Yes.   ? ?Have you been able to return to your normal activities? Yes.   ? ?Do you have any questions about your discharge instructions: ?Diet   No. ?Medications  No. ?Follow up visit  No. ? ?Do you have questions or concerns about your Care? No. ? ?Actions: ?* If pain score is 4 or above: ?No action needed, pain <4. ? ? ?

## 2021-07-31 ENCOUNTER — Other Ambulatory Visit: Payer: Self-pay | Admitting: Internal Medicine

## 2021-08-30 ENCOUNTER — Other Ambulatory Visit: Payer: Self-pay | Admitting: Internal Medicine

## 2021-11-11 ENCOUNTER — Other Ambulatory Visit: Payer: Self-pay | Admitting: Internal Medicine

## 2021-12-02 ENCOUNTER — Encounter: Payer: Self-pay | Admitting: Internal Medicine

## 2021-12-02 ENCOUNTER — Telehealth: Payer: Self-pay | Admitting: Internal Medicine

## 2021-12-02 ENCOUNTER — Ambulatory Visit: Payer: Managed Care, Other (non HMO) | Admitting: Internal Medicine

## 2021-12-02 DIAGNOSIS — M654 Radial styloid tenosynovitis [de Quervain]: Secondary | ICD-10-CM | POA: Diagnosis not present

## 2021-12-02 DIAGNOSIS — E538 Deficiency of other specified B group vitamins: Secondary | ICD-10-CM

## 2021-12-02 DIAGNOSIS — Z23 Encounter for immunization: Secondary | ICD-10-CM | POA: Diagnosis not present

## 2021-12-02 DIAGNOSIS — M5440 Lumbago with sciatica, unspecified side: Secondary | ICD-10-CM | POA: Diagnosis not present

## 2021-12-02 DIAGNOSIS — J4520 Mild intermittent asthma, uncomplicated: Secondary | ICD-10-CM | POA: Diagnosis not present

## 2021-12-02 DIAGNOSIS — E1140 Type 2 diabetes mellitus with diabetic neuropathy, unspecified: Secondary | ICD-10-CM

## 2021-12-02 LAB — COMPREHENSIVE METABOLIC PANEL
ALT: 23 U/L (ref 0–35)
AST: 21 U/L (ref 0–37)
Albumin: 4.3 g/dL (ref 3.5–5.2)
Alkaline Phosphatase: 92 U/L (ref 39–117)
BUN: 13 mg/dL (ref 6–23)
CO2: 28 mEq/L (ref 19–32)
Calcium: 10 mg/dL (ref 8.4–10.5)
Chloride: 102 mEq/L (ref 96–112)
Creatinine, Ser: 0.67 mg/dL (ref 0.40–1.20)
GFR: 98.22 mL/min (ref >60.00)
Glucose, Bld: 107 mg/dL — ABNORMAL HIGH (ref 70–99)
Potassium: 3.6 mEq/L (ref 3.5–5.1)
Sodium: 141 mEq/L (ref 135–145)
Total Bilirubin: 0.4 mg/dL (ref 0.2–1.2)
Total Protein: 8 g/dL (ref 6.0–8.3)

## 2021-12-02 LAB — HEMOGLOBIN A1C: Hgb A1c MFr Bld: 7.5 % — ABNORMAL HIGH (ref 4.6–6.5)

## 2021-12-02 MED ORDER — BROMOCRIPTINE MESYLATE 2.5 MG PO TABS: 2.5000 mg | ORAL_TABLET | Freq: Every day | ORAL | 3 refills | Status: DC

## 2021-12-02 MED ORDER — METFORMIN HCL 1000 MG PO TABS: 1000.0000 mg | ORAL_TABLET | Freq: Two times a day (BID) | ORAL | 3 refills | Status: DC

## 2021-12-02 MED ORDER — PIOGLITAZONE HCL 45 MG PO TABS: 45.0000 mg | ORAL_TABLET | Freq: Every day | ORAL | 3 refills | Status: DC

## 2021-12-02 MED ORDER — TRIAMCINOLONE ACETONIDE 0.5 % EX OINT: 1.0000 | TOPICAL_OINTMENT | Freq: Three times a day (TID) | CUTANEOUS | 1 refills | Status: DC

## 2021-12-02 MED ORDER — FLUTICASONE-SALMETEROL 100-50 MCG/ACT IN AEPB: 1.0000 | INHALATION_SPRAY | Freq: Two times a day (BID) | RESPIRATORY_TRACT | 11 refills | Status: DC

## 2021-12-02 MED ORDER — GABAPENTIN 300 MG PO CAPS: 300.0000 mg | ORAL_CAPSULE | Freq: Three times a day (TID) | ORAL | 5 refills | Status: DC

## 2021-12-02 MED ORDER — HYDROCHLOROTHIAZIDE 12.5 MG PO CAPS: 12.5000 mg | ORAL_CAPSULE | Freq: Every morning | ORAL | 3 refills | Status: DC

## 2021-12-02 MED ORDER — IBUPROFEN 600 MG PO TABS: 600.0000 mg | ORAL_TABLET | Freq: Three times a day (TID) | ORAL | 1 refills | Status: DC | PRN

## 2021-12-02 MED ORDER — ALBUTEROL SULFATE HFA 108 (90 BASE) MCG/ACT IN AERS: 1.0000 | INHALATION_SPRAY | RESPIRATORY_TRACT | 1 refills | Status: DC | PRN

## 2021-12-02 NOTE — Assessment & Plan Note (Addendum)
New Thumb spica splint - De quervain's tenosynovitis splint Blue-Emu cream -- use 2-3 times a day

## 2021-12-02 NOTE — Assessment & Plan Note (Signed)
Cont on Advair

## 2021-12-02 NOTE — Assessment & Plan Note (Signed)
F/ w/Dr Ronnald Ramp

## 2021-12-02 NOTE — Assessment & Plan Note (Signed)
On Vit B12 

## 2021-12-02 NOTE — Progress Notes (Signed)
Subjective:  Patient ID: Alexandra [name], female    DOB: [birth year]  Age: 56 y.o. MRN: [id number]  CC: Follow-up (6 MONTH F/U- Flu shot)   HPI Alexandra [name] presents for DM, HTN, GERD C/o R thumb pain - bad, x2 months  Outpatient Medications Prior to Visit  Medication Sig Dispense Refill   Blood Glucose Monitoring Suppl (ONETOUCH VERIO) w/Device KIT 1 Units by Does not apply route daily as needed. 1 kit 1   Cholecalciferol 1000 UNITS tablet Take 1 tablet (1,000 Units total) by mouth daily. 100 tablet 3   cyclobenzaprine (FLEXERIL) 10 MG tablet Take 1 tablet (10 mg total) by mouth 2 (two) times daily as needed. 90 tablet 1   famotidine (PEPCID) 40 MG tablet Take 1 tablet (40 mg total) by mouth daily. 90 tablet 3   Ginkgo Biloba 60 MG CAPS Take 2 capsules by mouth daily.     glucose blood (ONETOUCH VERIO) test strip And lancets 1/day 100 each 3   glucose blood (ONETOUCH VERIO) test strip Use as instructed 50 each 11   ketoconazole (NIZORAL) 2 % cream APPLY 1 CREAM TOPICALLY ONCE DAILY 60 g 0   Lancets (ONETOUCH ULTRASOFT) lancets Use as instructed 100 each 12   loratadine (CLARITIN) 10 MG tablet TAKE 1 TABLET BY MOUTH ONCE DAILY AS NEEDED FOR ALLERGIES 100 tablet 0   albuterol (VENTOLIN HFA) 108 (90 Base) MCG/ACT inhaler Inhale 1 puff into the lungs every 4 (four) hours as needed. 18 g 1   bromocriptine (PARLODEL) 2.5 MG tablet Take 1 tablet (2.5 mg total) by mouth daily. 90 tablet 3   fluticasone-salmeterol (ADVAIR) 100-50 MCG/ACT AEPB Inhale 1 puff into the lungs 2 (two) times daily. 1 each 11   gabapentin (NEURONTIN) 300 MG capsule Take 1 capsule (300 mg total) by mouth 3 (three) times daily. 90 capsule 5   hydrochlorothiazide (MICROZIDE) 12.5 MG capsule Take 1 capsule (12.5 mg total) by mouth every morning. 90 capsule 3   ibuprofen (ADVIL) 600 MG tablet TAKE 1 TABLET BY MOUTH EVERY 6 HOURS AS NEEDED 90 tablet 1   metFORMIN (GLUCOPHAGE) 1000 MG tablet Take 1 tablet (1,000 mg  total) by mouth 2 (two) times daily with a meal. 180 tablet 3   pioglitazone (ACTOS) 45 MG tablet Take 1 tablet (45 mg total) by mouth daily. 90 tablet 3   triamcinolone ointment (KENALOG) 0.5 % Apply 1 application topically 3 (three) times daily. Apply on a blister, skin crack 45 g 1   No facility-administered medications prior to visit.    ROS: Review of Systems  Constitutional:  Positive for unexpected weight change. Negative for activity change, appetite change, chills and fatigue.  HENT:  Negative for congestion, mouth sores and sinus pressure.   Eyes:  Negative for visual disturbance.  Respiratory:  Negative for cough and chest tightness.   Gastrointestinal:  Negative for abdominal pain and nausea.  Genitourinary:  Negative for difficulty urinating, frequency and vaginal pain.  Musculoskeletal:  Positive for arthralgias and back pain. Negative for gait problem.  Skin:  Negative for pallor and rash.  Neurological:  Negative for dizziness, tremors, weakness, numbness and headaches.  Psychiatric/Behavioral:  Negative for confusion, sleep disturbance and suicidal ideas.     Objective:  BP 120/84 (BP Location: Left Arm)   Pulse 84   Temp 98 F (36.7 C) (Oral)   Ht 5' 8" (1.727 m)   Wt 179 lb 9.6 oz (81.5 kg)   LMP  (LMP Unknown)  SpO2 98%   BMI 27.31 kg/m   BP Readings from Last 3 Encounters:  12/02/21 120/84  06/07/21 130/78  06/04/21 (!) 149/88    Wt Readings from Last 3 Encounters:  12/02/21 179 lb 9.6 oz (81.5 kg)  06/07/21 186 lb (84.4 kg)  06/04/21 184 lb (83.5 kg)    Physical Exam Constitutional:      General: She is not in acute distress.    Appearance: Normal appearance. She is well-developed.  HENT:     Head: Normocephalic.     Right Ear: External ear normal.     Left Ear: External ear normal.     Nose: Nose normal.  Eyes:     General:        Right eye: No discharge.        Left eye: No discharge.     Conjunctiva/sclera: Conjunctivae normal.      Pupils: Pupils are equal, round, and reactive to light.  Neck:     Thyroid: No thyromegaly.     Vascular: No JVD.     Trachea: No tracheal deviation.  Cardiovascular:     Rate and Rhythm: Normal rate and regular rhythm.     Heart sounds: Normal heart sounds.  Pulmonary:     Effort: No respiratory distress.     Breath sounds: No stridor. No wheezing.  Abdominal:     General: Bowel sounds are normal. There is no distension.     Palpations: Abdomen is soft. There is no mass.     Tenderness: There is no abdominal tenderness. There is no guarding or rebound.  Musculoskeletal:        General: Tenderness present.     Cervical back: Normal range of motion and neck supple. No rigidity.  Lymphadenopathy:     Cervical: No cervical adenopathy.  Skin:    Findings: No erythema or rash.  Neurological:     Cranial Nerves: No cranial nerve deficit.     Motor: No abnormal muscle tone.     Coordination: Coordination normal.     Deep Tendon Reflexes: Reflexes normal.  Psychiatric:        Behavior: Behavior normal.        Thought Content: Thought content normal.        Judgment: Judgment normal.   R thumb w/abd poll longus pain  Lab Results  Component Value Date   WBC 6.2 12/07/2020   HGB 13.0 12/07/2020   HCT 39.8 12/07/2020   PLT 368.0 12/07/2020   GLUCOSE 130 (H) 03/08/2021   CHOL 193 12/07/2020   TRIG 107.0 12/07/2020   HDL 55.10 12/07/2020   LDLDIRECT 122.0 03/03/2008   LDLCALC 116 (H) 12/07/2020   ALT 22 03/08/2021   AST 23 03/08/2021   NA 140 03/08/2021   K 4.1 03/08/2021   CL 101 03/08/2021   CREATININE 0.63 03/08/2021   BUN 14 03/08/2021   CO2 29 03/08/2021   TSH 5.62 (H) 03/08/2021   HGBA1C 7.4 (H) 03/08/2021   MICROALBUR <0.7 12/07/2020    CT CARDIAC SCORING (SELF PAY ONLY)  Addendum Date: 04/02/2021   ADDENDUM REPORT: 04/02/2021 09:27 CLINICAL DATA:  Cardiovascular Disease Risk stratification EXAM: Coronary Calcium Score TECHNIQUE: A gated, non-contrast computed  tomography scan of the heart was performed using 23m slice thickness. Axial images were analyzed on a dedicated workstation. Calcium scoring of the coronary arteries was performed using the Agatston method. FINDINGS: Coronary Calcium Score: Left main: 0 Left anterior descending artery: 0 Left circumflex artery: 0 Right  coronary artery: 0 Total: 0 Percentile: 0 Pericardium: Normal. Ascending Aorta: Normal caliber.  Mild aortic atherosclerosis Non-cardiac: See separate report from [facility]. IMPRESSION: Coronary calcium score of 0. This was 0 percentile for age-, race-, and sex-matched controls. Mild aortic atherosclerosis. RECOMMENDATIONS: Coronary artery calcium (CAC) score is a strong predictor of incident coronary heart disease (CHD) and provides predictive information beyond traditional risk factors. CAC scoring is reasonable to use in the decision to withhold, postpone, or initiate statin therapy in intermediate-risk or selected borderline-risk asymptomatic adults (age 18-75 years and LDL-C >=70 to <190 mg/dL) who do not have diabetes or established atherosclerotic cardiovascular disease (ASCVD).* In intermediate-risk (10-year ASCVD risk >=7.5% to <20%) adults or selected borderline-risk (10-year ASCVD risk >=5% to <7.5%) adults in whom a CAC score is measured for the purpose of making a treatment decision the following recommendations have been made: If CAC=0, it is reasonable to withhold statin therapy and reassess in 5 to 10 years, as long as higher risk conditions are absent (diabetes mellitus, family history of premature CHD in first degree relatives (males <55 years; females <65 years), cigarette smoking, or LDL >=190 mg/dL). If CAC is 1 to 99, it is reasonable to initiate statin therapy for patients >=70 years of age. If CAC is >=100 or >=75th percentile, it is reasonable to initiate statin therapy at any age. Cardiology referral should be considered for patients with CAC scores >=400 or >=75th  percentile. *2018 AHA/ACC/AACVPR/AAPA/ABC/ACPM/ADA/AGS/APhA/ASPC/NLA/PCNA Guideline on the Management of Blood Cholesterol: A Report of the American College of Cardiology/American Heart Association Task Force on Clinical Practice Guidelines. J Am Coll Cardiol. 2019;73(24):3168-3209. Kirk Ruths, MD Electronically Signed   By: Kirk Ruths M.D.   On: 04/02/2021 09:27   Result Date: 04/02/2021 EXAM: OVER-READ INTERPRETATION  CT CHEST The following report is an over-read performed by radiologist Dr. Vinnie Langton of [facility], [place] on 04/02/2021. This over-read does not include interpretation of cardiac or coronary anatomy or pathology. The coronary calcium score interpretation by the cardiologist is attached. COMPARISON:  None. FINDINGS: Atherosclerotic calcifications in the proximal ascending thoracic aorta. Within the visualized portions of the thorax there are no suspicious appearing pulmonary nodules or masses, there is no acute consolidative airspace disease, no pleural effusions, no pneumothorax and no lymphadenopathy. Visualized portions of the upper abdomen are unremarkable. There are no aggressive appearing lytic or blastic lesions noted in the visualized portions of the skeleton. IMPRESSION: 1.  Aortic Atherosclerosis (ICD10-I70.0). Electronically Signed: By: Vinnie Langton M.D. On: 04/02/2021 07:58    Assessment & Plan:   Problem List Items Addressed This Visit     Asthma    Cont on Advair      Relevant Medications   albuterol (VENTOLIN HFA) 108 (90 Base) MCG/ACT inhaler   fluticasone-salmeterol (ADVAIR) 100-50 MCG/ACT AEPB   De Quervain's tenosynovitis, right    [place] spica splint - De quervain's tenosynovitis splint Blue-Emu cream -- use 2-3 times a day         DM2 (diabetes mellitus, type 2) (HCC)     Cont on Actos, Parlodel, Metformin Check A1c      Relevant Medications   metFORMIN (GLUCOPHAGE) 1000 MG tablet   pioglitazone (ACTOS) 45 MG tablet    Other Relevant Orders   Hemoglobin A1c   Comprehensive metabolic panel   LOW BACK PAIN    F/ w/Dr Ronnald Ramp      Relevant Medications   ibuprofen (ADVIL) 600 MG tablet   Vitamin B12 deficiency  On Vit B12         Meds ordered this encounter  Medications   albuterol (VENTOLIN HFA) 108 (90 Base) MCG/ACT inhaler    Sig: Inhale 1 puff into the lungs every 4 (four) hours as needed.    Dispense:  18 g    Refill:  1   bromocriptine (PARLODEL) 2.5 MG tablet    Sig: Take 1 tablet (2.5 mg total) by mouth daily.    Dispense:  90 tablet    Refill:  3   fluticasone-salmeterol (ADVAIR) 100-50 MCG/ACT AEPB    Sig: Inhale 1 puff into the lungs 2 (two) times daily.    Dispense:  1 each    Refill:  11   gabapentin (NEURONTIN) 300 MG capsule    Sig: Take 1 capsule (300 mg total) by mouth 3 (three) times daily.    Dispense:  90 capsule    Refill:  5   hydrochlorothiazide (MICROZIDE) 12.5 MG capsule    Sig: Take 1 capsule (12.5 mg total) by mouth every morning.    Dispense:  90 capsule    Refill:  3   ibuprofen (ADVIL) 600 MG tablet    Sig: Take 1 tablet (600 mg total) by mouth every 8 (eight) hours as needed for moderate pain.    Dispense:  90 tablet    Refill:  1   metFORMIN (GLUCOPHAGE) 1000 MG tablet    Sig: Take 1 tablet (1,000 mg total) by mouth 2 (two) times daily with a meal.    Dispense:  180 tablet    Refill:  3   pioglitazone (ACTOS) 45 MG tablet    Sig: Take 1 tablet (45 mg total) by mouth daily.    Dispense:  90 tablet    Refill:  3   triamcinolone ointment (KENALOG) 0.5 %    Sig: Apply 1 Application topically 3 (three) times daily. Apply on a blister, skin crack    Dispense:  45 g    Refill:  1      Follow-up: Return in about 6 months (around 06/02/2022) for Wellness Exam.  Walker Kehr, MD

## 2021-12-02 NOTE — Assessment & Plan Note (Signed)
Cont on Actos, Parlodel, Metformin Check A1c

## 2021-12-02 NOTE — Patient Instructions (Signed)
Thumb spica splint - De quervain's tenosynovitis splint  Blue-Emu cream -- use 2-3 times a day

## 2021-12-02 NOTE — Telephone Encounter (Signed)
[facility] called to let us know we'd faxed them a record request for eye exam pw that was supposed to go to [facility].   Please re-fax the request to [facility].

## 2021-12-03 NOTE — Telephone Encounter (Signed)
Refaxed to [facility].Marland KitchenJohny [name]

## 2021-12-06 ENCOUNTER — Other Ambulatory Visit: Payer: Self-pay | Admitting: Internal Medicine

## 2021-12-06 DIAGNOSIS — E1140 Type 2 diabetes mellitus with diabetic neuropathy, unspecified: Secondary | ICD-10-CM

## 2021-12-09 ENCOUNTER — Ambulatory Visit: Payer: Managed Care, Other (non HMO) | Admitting: Internal Medicine

## 2022-01-20 ENCOUNTER — Other Ambulatory Visit: Payer: Self-pay | Admitting: Internal Medicine

## 2022-02-03 NOTE — Therapy (Addendum)
OUTPATIENT PHYSICAL THERAPY THORACOLUMBAR EVALUATION/DC SUMMARY   Patient Name: Alexandra [name] MRN: [id number] DOB:1965/03/22, 56 y.o., female Today's Date: 02/04/2022 PHYSICAL THERAPY DISCHARGE SUMMARY  Visits from Start of Care: 1  Current functional level related to goals / functional outcomes: UTA   Remaining deficits: UTA   Education / Equipment: HEP   Patient agrees to discharge. Patient goals were not met. Patient is being discharged due to not returning since the last visit.  END OF SESSION:  PT End of Session - 02/04/22 0827     Visit Number 1    Number of Visits 12    Date for PT Re-Evaluation 04/01/22    Authorization Type [employer]    PT Start Time 0750    PT Stop Time 0830    PT Time Calculation (min) 40 min    Activity Tolerance Patient tolerated treatment well    Behavior During Therapy WFL for tasks assessed/performed             Past Medical History:  Diagnosis Date   ALLERGIC RHINITIS    Allergy    SEASONAL   Asthma    CTS (carpal tunnel syndrome)    Diabetes mellitus    Fibroids    uterine   Glaucoma    ?  DENIES 05/14/21   Hx of adenomatous colonic polyps 12/15/2017   Hypertension    Low back pain    Osteoarthritis    Polyarthralgia    Vitamin B12 deficiency    Past Surgical History:  Procedure Laterality Date   BACK SURGERY     2011   cervical bx     COLONOSCOPY     POLYPECTOMY     Patient Active Problem List   Diagnosis Date Noted   De Quervain's tenosynovitis, right 12/02/2021   Body mass index (BMI) of 25.0 to 29.9 06/07/2021   Retroverted uterus 06/07/2021   Uterine leiomyoma 06/07/2021   Atherosclerosis of aorta ([place]) 04/04/2021   Abnormal TSH 12/07/2020   Tinea pedis 12/10/2019   Diabetic neuropathy ([place]) 09/04/2018   Hx of adenomatous colonic polyps 12/15/2017   Left foot pain 10/07/2016   Well adult exam 01/18/2016   DM2 (diabetes mellitus, type 2) ([place]) 05/01/2014   Leg cramps 12/27/2013   Wound, open,  foot 09/28/2012   Cramps, extremity 08/27/2012   Insomnia 05/21/2012   CTS (carpal tunnel syndrome)    Unspecified glaucoma(365.9)    Polyarthralgia    Vitamin B12 deficiency    CHEST PAIN 05/28/2009   Lumbar radiculopathy 09/25/2008   URI 03/18/2008   BRONCHITIS, ACUTE 10/05/2007   HYPOKALEMIA 07/06/2007   Essential hypertension 03/11/2007   ALLERGIC RHINITIS 03/11/2007   Asthma 03/11/2007   OSTEOARTHRITIS 03/08/2007   LOW BACK PAIN 03/08/2007    PCP: Cassandria Anger, MD   REFERRING PROVIDER: Eustace Moore, MD   REFERRING DIAG: M54.16 (ICD-10-CM) - Radiculopathy, lumbar region  Rationale for Evaluation and Treatment: Rehabilitation  THERAPY DIAG: M54.16 (ICD-10-CM) - Radiculopathy, lumbar region  ONSET DATE: chronic  SUBJECTIVE:  SUBJECTIVE STATEMENT: Patient is a 56 year old female who is status post right L5-S1 microdiskectomy in 2014 comes in with a 3 year history of low back pain. It radiates into the right leg in an L5 type distribution. There is numbness in the top of the foot and ankle. There is occasional pain in the left leg. The pain radiates up the right side of the back to the thoracic region but this is rare.  Symptoms worse with prolonged standing tasks  PERTINENT HISTORY:   Most recent encounter only, dated '01/27/2022 08:30'.  back pain (chief complaint). Description: Pleasant 56 year old female who is status post right L5-S1 microdiskectomy in 2014 comes in with a 3 year history of low back pain. It radiates into the right leg in an L5 type distribution. There is numbness in the top of the foot and ankle. There is occasional pain in the left leg. The pain radiates up the right side of the back to the thoracic region. She has been on gabapentin and Flexeril and ibuprofen.  She takes BC powders. Her last MRI was in 2020. Her pain is an 8/10. It is fairly constant.   PAIN:  Are you having pain? Yes: NPRS scale: 8/10 Pain location: Low back and RLE Pain description: shooting Aggravating factors: prolonged standing Relieving factors: undetemined  PRECAUTIONS: None  WEIGHT BEARING RESTRICTIONS: No  FALLS:  Has patient fallen in last 6 months? No  LIVING ENVIRONMENT: Lives with: lives with their family  OCCUPATION: sanitation  PLOF: Independent  PATIENT GOALS: To reduce and manage my pain levels  NEXT MD VISIT:   OBJECTIVE:   DIAGNOSTIC FINDINGS:  Narrative & Impression CLINICAL DATA:  Chronic low back pain with right-sided radiculopathy   EXAM: MRI LUMBAR SPINE WITHOUT AND WITH CONTRAST   TECHNIQUE: Multiplanar and multiecho pulse sequences of the lumbar spine were obtained without and with intravenous contrast.   CONTRAST:  77m MULTIHANCE GADOBENATE DIMEGLUMINE 529 MG/ML IV SOLN   COMPARISON:  MRI 03/03/2016.  X-ray 09/27/2018   FINDINGS: Segmentation:  Standard.   Alignment:  Trace retrolisthesis L5 on S1.   Vertebrae: No fracture, evidence of discitis, or bone lesion. Chronic discogenic endplate marrow changes at L5-S1. Mild diffuse intrinsic canal narrowing on the basis of congenitally short pedicles.   Conus medullaris and cauda equina: Conus extends to the L1 level. Conus and cauda equina appear normal.   Paraspinal and other soft tissues: Duplicated IVC. No acute findings within the paraspinal soft tissues.   Disc levels:   T12-L1: No significant disc protrusion, foraminal stenosis, or canal stenosis.   L1-L2: No significant disc protrusion, foraminal stenosis, or canal stenosis.   L2-L3: Mild diffuse disc bulge, bilateral facet arthrosis, and ligamentum flavum buckling result in mild canal stenosis. No foraminal stenosis. No significant interval change.   L3-L4: Diffuse disc bulge including left extraforaminal  component, bilateral facet arthrosis, and ligamentum flavum buckling result in moderate to severe canal stenosis, effacement of the subarticular recesses bilaterally, and moderate bilateral foraminal stenosis. Findings do not appear significantly progressed from prior.   L4-L5: Diffuse disc bulge, right worse than left facet arthrosis and ligamentum flavum buckling contribute to severe canal stenosis, severe right foraminal stenosis, and severe left foraminal stenosis. There is effacement of the subarticular recesses bilaterally. Findings have slightly progressed from prior.   L5-S1: Prior right laminectomy. Diffuse disc bulge and bilateral facet arthrosis resulting in severe bilateral foraminal stenosis and moderate to severe canal stenosis with effacement of the subarticular recesses bilaterally. Findings have  progressed from prior. Postcontrast images reveal of mild enhancing scar around the right S1 nerve root within the subarticular recess.   IMPRESSION: 1. Progressive lower lumbar spondylosis, most severe at the L4-5 level where there is severe bilateral foraminal stenosis and severe canal stenosis. 2. Mild scar surrounds the right S1 nerve root.     Electronically Signed   By: Davina Poke M.D.   On: 11/12/2018 14:13  PATIENT SURVEYS:  FOTO (not set up)  SCREENING FOR RED FLAGS:  Negative  COGNITION: Overall cognitive status: Within functional limits for tasks assessed     SENSATION: Not tested  MUSCLE LENGTH: Hamstrings: Right 80 deg; Left 80 deg Thomas test: unremarkable   POSTURE:  R lateral shift, R rib hump,   PALPATION: Not assessed  LUMBAR ROM:   AROM eval  Flexion 90%  Extension 75%  Right lateral flexion 75%  Left lateral flexion 75%  Right rotation   Left rotation    (Blank rows = not tested)  LOWER EXTREMITY ROM:   WFL throughout  Active  Right eval Left eval  Hip flexion    Hip extension    Hip abduction    Hip adduction     Hip internal rotation    Hip external rotation    Knee flexion    Knee extension    Ankle dorsiflexion    Ankle plantarflexion    Ankle inversion    Ankle eversion     (Blank rows = not tested)  LOWER EXTREMITY MMT:    MMT Right eval Left eval  Hip flexion    Hip extension    Hip abduction    Hip adduction    Hip internal rotation    Hip external rotation    Knee flexion    Knee extension    Ankle dorsiflexion    Ankle plantarflexion    Ankle inversion    Ankle eversion    core 3 3   (Blank rows = not tested)  LUMBAR SPECIAL TESTS:  Straight leg raise test: Negative, Slump test: Positive, Single leg stance test: Negative, FABER test: Negative, and Thomas test: Negative  FUNCTIONAL TESTS:  5 times sit to stand: 15s arms crossed  GAIT: Distance walked: 21f x2 Assistive device utilized: None Level of assistance: Complete Independence Comments: antalgic  TODAY'S TREATMENT:                                                                                                                              DATE: 02/04/22  Eval and HEP   PATIENT EDUCATION:  Education details: Discussed eval findings, rehab rationale and POC and patient is in agreement  Person educated: Patient Education method: Explanation Education comprehension: verbalized understanding and needs further education  HOME EXERCISE PROGRAM: Access Code: [email or web address]: [email or web address] Date: 02/04/2022 Prepared by: JSharlynn Oliphant Exercises - Curl Up with Arms Crossed  - 2 x daily - 5 x weekly - 1 sets - 10  reps - Supine Bridge  - 2 x daily - 5 x weekly - 1 sets - 10 reps - Supine Sciatic Nerve Glide  - 2 x daily - 5 x weekly - 1 sets - 10 reps  ASSESSMENT:  CLINICAL IMPRESSION: Patient is a 56 y.o. female who was seen today for physical therapy evaluation and treatment for chronic low back pain. Patient presents with ROM limitations in trunk, postural deficits including R rib  hump and R lateral sift in standing.  Weakness noted in core musculature and abdominal wall.  5 times sit to stand score shows functional mobility and strength.  Active range of motion in lumbar spine only slightly limited in extension and lateral sidebending bilaterally.  Straight leg raise test negative bilaterally however some symptoms were reproduced with sciatic nerve tension test on the right.  Patient is a good candidate for outpatient physical therapy with goals of increasing core strength active range of motion of lumbar spine and relieving sciatic nerve tension on the right side.  OBJECTIVE IMPAIRMENTS: Abnormal gait, decreased activity tolerance, decreased knowledge of condition, decreased mobility, difficulty walking, decreased ROM, decreased strength, impaired flexibility, postural dysfunction, and pain.   ACTIVITY LIMITATIONS: carrying, lifting, sitting, standing, and stairs  PERSONAL FACTORS: Age, Past/current experiences, and prior surgeries  are also affecting patient's functional outcome.   REHAB POTENTIAL: Good  CLINICAL DECISION MAKING: Evolving/moderate complexity  EVALUATION COMPLEXITY: Moderate   GOALS: Goals reviewed with patient? No  SHORT TERM GOALS: Target date: 02/18/22  Patient to demonstrate independence in HEP  Baseline:[id number] Goal status: INITIAL  2.  Increase core strength to 3+/5 Baseline: 3/5 Goal status: INITIAL   LONG TERM GOALS: Target date:   Increase ODI score to Baseline: TBD Goal status: INITIAL  2.  6/10 worst pain Baseline: 8/10 worst pain Goal status: INITIAL  3.  Increase core strength to 4-/5 Baseline: 3/5 Goal status: INITIAL  4.  Negative R sciatic nerve tension test Baseline: Positive R sciatic nerve glide test Goal status: INITIAL      PLAN:  PT FREQUENCY: 2x/week  PT DURATION: 6 weeks  PLANNED INTERVENTIONS: Therapeutic exercises, Therapeutic activity, Neuromuscular re-education, Balance training, Gait  training, Patient/Family education, Self Care, Joint mobilization, Stair training, Manual therapy, and Re-evaluation.  PLAN FOR NEXT SESSION: HEP and update, core strength, stretching, nerve root glides, flexibility tasks   Lanice Shirts, PT 02/04/2022, 10:14 AM

## 2022-02-04 ENCOUNTER — Ambulatory Visit: Payer: Managed Care, Other (non HMO) | Attending: Neurological Surgery

## 2022-02-04 ENCOUNTER — Other Ambulatory Visit: Payer: Self-pay

## 2022-02-04 DIAGNOSIS — R531 Weakness: Secondary | ICD-10-CM | POA: Insufficient documentation

## 2022-02-04 DIAGNOSIS — M5459 Other low back pain: Secondary | ICD-10-CM | POA: Diagnosis not present

## 2022-02-04 DIAGNOSIS — M5431 Sciatica, right side: Secondary | ICD-10-CM | POA: Insufficient documentation

## 2022-02-25 ENCOUNTER — Encounter: Payer: Managed Care, Other (non HMO) | Admitting: Physical Therapy

## 2022-03-05 ENCOUNTER — Encounter: Payer: Managed Care, Other (non HMO) | Admitting: Physical Therapy

## 2022-03-10 ENCOUNTER — Encounter: Payer: Managed Care, Other (non HMO) | Admitting: Physical Therapy

## 2022-03-16 ENCOUNTER — Other Ambulatory Visit: Payer: Self-pay | Admitting: Internal Medicine

## 2022-03-25 ENCOUNTER — Encounter: Payer: Managed Care, Other (non HMO) | Admitting: Physical Therapy

## 2022-06-06 ENCOUNTER — Ambulatory Visit: Payer: Managed Care, Other (non HMO) | Admitting: Internal Medicine

## 2022-06-06 ENCOUNTER — Encounter: Payer: Self-pay | Admitting: Internal Medicine

## 2022-06-06 VITALS — BP 122/80 | HR 102 | Temp 98.6°F | Ht 68.0 in | Wt 180.0 lb

## 2022-06-06 DIAGNOSIS — I700 Atherosclerosis of aorta: Secondary | ICD-10-CM

## 2022-06-06 DIAGNOSIS — Z0000 Encounter for general adult medical examination without abnormal findings: Secondary | ICD-10-CM

## 2022-06-06 DIAGNOSIS — I10 Essential (primary) hypertension: Secondary | ICD-10-CM | POA: Diagnosis not present

## 2022-06-06 DIAGNOSIS — E1140 Type 2 diabetes mellitus with diabetic neuropathy, unspecified: Secondary | ICD-10-CM | POA: Diagnosis not present

## 2022-06-06 DIAGNOSIS — M5440 Lumbago with sciatica, unspecified side: Secondary | ICD-10-CM

## 2022-06-06 DIAGNOSIS — E538 Deficiency of other specified B group vitamins: Secondary | ICD-10-CM

## 2022-06-06 LAB — URINALYSIS
Bilirubin Urine: NEGATIVE
Hgb urine dipstick: NEGATIVE
Ketones, ur: NEGATIVE
Leukocytes,Ua: NEGATIVE
Nitrite: NEGATIVE
Specific Gravity, Urine: 1.015 (ref 1.000–1.030)
Total Protein, Urine: NEGATIVE
Urine Glucose: NEGATIVE
Urobilinogen, UA: 0.2 (ref 0.0–1.0)
pH: 6 (ref 5.0–8.0)

## 2022-06-06 LAB — COMPREHENSIVE METABOLIC PANEL
ALT: 39 U/L — ABNORMAL HIGH (ref 0–35)
AST: 27 U/L (ref 0–37)
Albumin: 4.3 g/dL (ref 3.5–5.2)
Alkaline Phosphatase: 91 U/L (ref 39–117)
BUN: 15 mg/dL (ref 6–23)
CO2: 29 mEq/L (ref 19–32)
Calcium: 10.2 mg/dL (ref 8.4–10.5)
Chloride: 102 mEq/L (ref 96–112)
Creatinine, Ser: 0.75 mg/dL (ref 0.40–1.20)
GFR: 89.15 mL/min (ref >60.00)
Glucose, Bld: 142 mg/dL — ABNORMAL HIGH (ref 70–99)
Potassium: 4.2 mEq/L (ref 3.5–5.1)
Sodium: 142 mEq/L (ref 135–145)
Total Bilirubin: 0.2 mg/dL (ref 0.2–1.2)
Total Protein: 7.7 g/dL (ref 6.0–8.3)

## 2022-06-06 LAB — MICROALBUMIN / CREATININE URINE RATIO
Creatinine,U: 39.6 mg/dL
Microalb Creat Ratio: 3.7 mg/g (ref 0.0–30.0)
Microalb, Ur: 1.5 mg/dL (ref 0.0–1.9)

## 2022-06-06 LAB — CBC WITH DIFFERENTIAL/PLATELET
Basophils Absolute: 0 10*3/uL (ref 0.0–0.1)
Basophils Relative: 0.3 % (ref 0.0–3.0)
Eosinophils Absolute: 0.1 10*3/uL (ref 0.0–0.7)
Eosinophils Relative: 1.6 % (ref 0.0–5.0)
HCT: 40.8 % (ref 36.0–46.0)
Hemoglobin: 13.4 g/dL (ref 12.0–15.0)
Lymphocytes Relative: 33.2 % (ref 12.0–46.0)
Lymphs Abs: 1.9 10*3/uL (ref 0.7–4.0)
MCHC: 32.9 g/dL (ref 30.0–36.0)
MCV: 96.7 fl (ref 78.0–100.0)
Monocytes Absolute: 0.4 10*3/uL (ref 0.1–1.0)
Monocytes Relative: 6.3 % (ref 3.0–12.0)
Neutro Abs: 3.3 10*3/uL (ref 1.4–7.7)
Neutrophils Relative %: 58.6 % (ref 43.0–77.0)
Platelets: 399 10*3/uL (ref 150.0–400.0)
RBC: 4.22 Mil/uL (ref 3.87–5.11)
RDW: 15.3 % (ref 11.5–15.5)
WBC: 5.6 10*3/uL (ref 4.0–10.5)

## 2022-06-06 LAB — LIPID PANEL
Cholesterol: 211 mg/dL — ABNORMAL HIGH (ref 0–200)
HDL: 59.5 mg/dL (ref >39.00)
LDL Cholesterol: 127 mg/dL — ABNORMAL HIGH (ref 0–99)
NonHDL: 151.1
Total CHOL/HDL Ratio: 4
Triglycerides: 119 mg/dL (ref 0.0–149.0)
VLDL: 23.8 mg/dL (ref 0.0–40.0)

## 2022-06-06 LAB — TSH: TSH: 5.04 u[IU]/mL (ref 0.35–5.50)

## 2022-06-06 LAB — HEMOGLOBIN A1C: Hgb A1c MFr Bld: 7.5 % — ABNORMAL HIGH (ref 4.6–6.5)

## 2022-06-06 MED ORDER — LORATADINE 10 MG PO TABS: ORAL_TABLET | ORAL | 2 refills | Status: DC

## 2022-06-06 MED ORDER — TRIAMCINOLONE ACETONIDE 0.5 % EX OINT: 1.0000 | TOPICAL_OINTMENT | Freq: Three times a day (TID) | CUTANEOUS | 1 refills | Status: DC

## 2022-06-06 MED ORDER — FLUTICASONE-SALMETEROL 100-50 MCG/ACT IN AEPB: 1.0000 | INHALATION_SPRAY | Freq: Two times a day (BID) | RESPIRATORY_TRACT | 11 refills | Status: DC

## 2022-06-06 MED ORDER — ONETOUCH VERIO VI STRP: ORAL_STRIP | 11 refills | Status: DC

## 2022-06-06 MED ORDER — BROMOCRIPTINE MESYLATE 2.5 MG PO TABS: 2.5000 mg | ORAL_TABLET | Freq: Every day | ORAL | 3 refills | Status: DC

## 2022-06-06 MED ORDER — ONETOUCH ULTRASOFT LANCETS MISC: 12 refills | Status: DC

## 2022-06-06 MED ORDER — ONETOUCH VERIO W/DEVICE KIT: 1.0000 [IU] | PACK | Freq: Every day | 1 refills | Status: AC | PRN

## 2022-06-06 MED ORDER — ALBUTEROL SULFATE HFA 108 (90 BASE) MCG/ACT IN AERS: 1.0000 | INHALATION_SPRAY | RESPIRATORY_TRACT | 1 refills | Status: DC | PRN

## 2022-06-06 MED ORDER — METFORMIN HCL 1000 MG PO TABS: 1000.0000 mg | ORAL_TABLET | Freq: Two times a day (BID) | ORAL | 3 refills | Status: DC

## 2022-06-06 MED ORDER — FAMOTIDINE 40 MG PO TABS: 40.0000 mg | ORAL_TABLET | Freq: Every day | ORAL | 3 refills | Status: DC

## 2022-06-06 MED ORDER — CYCLOBENZAPRINE HCL 10 MG PO TABS: 10.0000 mg | ORAL_TABLET | Freq: Two times a day (BID) | ORAL | 1 refills | Status: DC | PRN

## 2022-06-06 MED ORDER — HYDROCHLOROTHIAZIDE 12.5 MG PO CAPS: 12.5000 mg | ORAL_CAPSULE | Freq: Every morning | ORAL | 3 refills | Status: DC

## 2022-06-06 MED ORDER — PIOGLITAZONE HCL 45 MG PO TABS: 45.0000 mg | ORAL_TABLET | Freq: Every day | ORAL | 3 refills | Status: DC

## 2022-06-06 MED ORDER — KETOCONAZOLE 2 % EX CREA: TOPICAL_CREAM | CUTANEOUS | 0 refills | Status: DC

## 2022-06-06 MED ORDER — GABAPENTIN 300 MG PO CAPS: 300.0000 mg | ORAL_CAPSULE | Freq: Three times a day (TID) | ORAL | 5 refills | Status: DC

## 2022-06-06 NOTE — Assessment & Plan Note (Signed)
On Vit B12 

## 2022-06-06 NOTE — Progress Notes (Signed)
Subjective:  Patient ID: Alexandra [name], female    DOB: [birth year]  Age: 57 y.o. MRN: [id number]  CC: Annual Exam   HPI Alexandra [name] presents for DM, HTN, GERD  Outpatient Medications Prior to Visit  Medication Sig Dispense Refill   Cholecalciferol 1000 UNITS tablet Take 1 tablet (1,000 Units total) by mouth daily. 100 tablet 3   Ginkgo Biloba 60 MG CAPS Take 2 capsules by mouth daily.     glucose blood (ONETOUCH VERIO) test strip And lancets 1/day 100 each 3   ibuprofen (ADVIL) 600 MG tablet TAKE 1 TABLET BY MOUTH EVERY 6 HOURS AS NEEDED 90 tablet 0   albuterol (VENTOLIN HFA) 108 (90 Base) MCG/ACT inhaler Inhale 1 puff into the lungs every 4 (four) hours as needed. 18 g 1   Blood Glucose Monitoring Suppl (ONETOUCH VERIO) w/Device KIT 1 Units by Does not apply route daily as needed. 1 kit 1   bromocriptine (PARLODEL) 2.5 MG tablet Take 1 tablet (2.5 mg total) by mouth daily. 90 tablet 3   cyclobenzaprine (FLEXERIL) 10 MG tablet Take 1 tablet (10 mg total) by mouth 2 (two) times daily as needed. 90 tablet 1   famotidine (PEPCID) 40 MG tablet Take 1 tablet (40 mg total) by mouth daily. 90 tablet 3   fluticasone-salmeterol (ADVAIR) 100-50 MCG/ACT AEPB Inhale 1 puff into the lungs 2 (two) times daily. 1 each 11   gabapentin (NEURONTIN) 300 MG capsule Take 1 capsule (300 mg total) by mouth 3 (three) times daily. 90 capsule 5   glucose blood (ONETOUCH VERIO) test strip Use as instructed 50 each 11   hydrochlorothiazide (MICROZIDE) 12.5 MG capsule Take 1 capsule (12.5 mg total) by mouth every morning. 90 capsule 3   ketoconazole (NIZORAL) 2 % cream APPLY 1 CREAM TOPICALLY ONCE DAILY 60 g 0   Lancets (ONETOUCH ULTRASOFT) lancets Use as instructed 100 each 12   loratadine (CLARITIN) 10 MG tablet TAKE 1 TABLET BY MOUTH ONCE DAILY AS NEEDED FOR ALLERGIES 100 tablet 2   metFORMIN (GLUCOPHAGE) 1000 MG tablet Take 1 tablet (1,000 mg total) by mouth 2 (two) times daily with a meal. 180  tablet 3   pioglitazone (ACTOS) 45 MG tablet Take 1 tablet (45 mg total) by mouth daily. 90 tablet 3   triamcinolone ointment (KENALOG) 0.5 % Apply 1 Application topically 3 (three) times daily. Apply on a blister, skin crack 45 g 1   No facility-administered medications prior to visit.    ROS: Review of Systems  Constitutional:  Negative for activity change, appetite change, chills, fatigue and unexpected weight change.  HENT:  Negative for congestion, mouth sores and sinus pressure.   Eyes:  Negative for visual disturbance.  Respiratory:  Negative for cough and chest tightness.   Gastrointestinal:  Negative for abdominal pain and nausea.  Genitourinary:  Negative for difficulty urinating, frequency and vaginal pain.  Musculoskeletal:  Positive for back pain. Negative for gait problem.  Skin:  Negative for pallor and rash.  Neurological:  Negative for dizziness, tremors, weakness, numbness and headaches.  Psychiatric/Behavioral:  Negative for confusion, sleep disturbance and suicidal ideas.     Objective:  BP 122/80 (BP Location: Right Arm, Patient Position: Sitting, Cuff Size: Normal)   Pulse (!) 102   Temp 98.6 F (37 C) (Oral)   Ht 5\' 8"  (1.727 m)   Wt 180 lb (81.6 kg)   LMP  (LMP Unknown)   SpO2 96%   BMI 27.37 kg/m   BP  Readings from Last 3 Encounters:  06/06/22 122/80  12/02/21 120/84  06/07/21 130/78    Wt Readings from Last 3 Encounters:  06/06/22 180 lb (81.6 kg)  12/02/21 179 lb 9.6 oz (81.5 kg)  06/07/21 186 lb (84.4 kg)    Physical Exam Constitutional:      General: She is not in acute distress.    Appearance: She is well-developed. She is obese.  HENT:     Head: Normocephalic.     Right Ear: External ear normal.     Left Ear: External ear normal.     Nose: Nose normal.  Eyes:     General:        Right eye: No discharge.        Left eye: No discharge.     Conjunctiva/sclera: Conjunctivae normal.     Pupils: Pupils are equal, round, and reactive  to light.  Neck:     Thyroid: No thyromegaly.     Vascular: No JVD.     Trachea: No tracheal deviation.  Cardiovascular:     Rate and Rhythm: Normal rate and regular rhythm.     Heart sounds: Normal heart sounds.  Pulmonary:     Effort: No respiratory distress.     Breath sounds: No stridor. No wheezing.  Abdominal:     General: Bowel sounds are normal. There is no distension.     Palpations: Abdomen is soft. There is no mass.     Tenderness: There is no abdominal tenderness. There is no guarding or rebound.  Musculoskeletal:        General: Tenderness present.     Cervical back: Normal range of motion and neck supple. No rigidity.  Lymphadenopathy:     Cervical: No cervical adenopathy.  Skin:    Findings: No erythema or rash.  Neurological:     Mental Status: She is oriented to person, place, and time.     Cranial Nerves: No cranial nerve deficit.     Motor: No abnormal muscle tone.     Coordination: Coordination normal.     Deep Tendon Reflexes: Reflexes normal.  Psychiatric:        Behavior: Behavior normal.        Thought Content: Thought content normal.        Judgment: Judgment normal.     Lab Results  Component Value Date   WBC 6.2 12/07/2020   HGB 13.0 12/07/2020   HCT 39.8 12/07/2020   PLT 368.0 12/07/2020   GLUCOSE 107 (H) 12/02/2021   CHOL 193 12/07/2020   TRIG 107.0 12/07/2020   HDL 55.10 12/07/2020   LDLDIRECT 122.0 03/03/2008   LDLCALC 116 (H) 12/07/2020   ALT 23 12/02/2021   AST 21 12/02/2021   NA 141 12/02/2021   K 3.6 12/02/2021   CL 102 12/02/2021   CREATININE 0.67 12/02/2021   BUN 13 12/02/2021   CO2 28 12/02/2021   TSH 5.62 (H) 03/08/2021   HGBA1C 7.5 (H) 12/02/2021   MICROALBUR <0.7 12/07/2020    CT CARDIAC SCORING (SELF PAY ONLY)  Addendum Date: 04/02/2021   ADDENDUM REPORT: 04/02/2021 09:27 CLINICAL DATA:  Cardiovascular Disease Risk stratification EXAM: Coronary Calcium Score TECHNIQUE: A gated, non-contrast computed tomography  scan of the heart was performed using 18mm slice thickness. Axial images were analyzed on a dedicated workstation. Calcium scoring of the coronary arteries was performed using the Agatston method. FINDINGS: Coronary Calcium Score: Left main: 0 Left anterior descending artery: 0 Left circumflex artery: 0 Right coronary  artery: 0 Total: 0 Percentile: 0 Pericardium: Normal. Ascending Aorta: Normal caliber.  Mild aortic atherosclerosis Non-cardiac: See separate report from [facility]. IMPRESSION: Coronary calcium score of 0. This was 0 percentile for age-, race-, and sex-matched controls. Mild aortic atherosclerosis. RECOMMENDATIONS: Coronary artery calcium (CAC) score is a strong predictor of incident coronary heart disease (CHD) and provides predictive information beyond traditional risk factors. CAC scoring is reasonable to use in the decision to withhold, postpone, or initiate statin therapy in intermediate-risk or selected borderline-risk asymptomatic adults (age 56-75 years and LDL-C >=70 to <190 mg/dL) who do not have diabetes or established atherosclerotic cardiovascular disease (ASCVD).* In intermediate-risk (10-year ASCVD risk >=7.5% to <20%) adults or selected borderline-risk (10-year ASCVD risk >=5% to <7.5%) adults in whom a CAC score is measured for the purpose of making a treatment decision the following recommendations have been made: If CAC=0, it is reasonable to withhold statin therapy and reassess in 5 to 10 years, as long as higher risk conditions are absent (diabetes mellitus, family history of premature CHD in first degree relatives (males <55 years; females <65 years), cigarette smoking, or LDL >=190 mg/dL). If CAC is 1 to 99, it is reasonable to initiate statin therapy for patients >=46 years of age. If CAC is >=100 or >=75th percentile, it is reasonable to initiate statin therapy at any age. Cardiology referral should be considered for patients with CAC scores >=400 or >=75th  percentile. *2018 AHA/ACC/AACVPR/AAPA/ABC/ACPM/ADA/AGS/APhA/ASPC/NLA/PCNA Guideline on the Management of Blood Cholesterol: A Report of the American College of Cardiology/American Heart Association Task Force on Clinical Practice Guidelines. J Am Coll Cardiol. 2019;73(24):3168-3209. Kirk Ruths, MD Electronically Signed   By: Kirk Ruths M.D.   On: 04/02/2021 09:27   Result Date: 04/02/2021 EXAM: OVER-READ INTERPRETATION  CT CHEST The following report is an over-read performed by radiologist Dr. Vinnie Langton of [facility], [place] on 04/02/2021. This over-read does not include interpretation of cardiac or coronary anatomy or pathology. The coronary calcium score interpretation by the cardiologist is attached. COMPARISON:  None. FINDINGS: Atherosclerotic calcifications in the proximal ascending thoracic aorta. Within the visualized portions of the thorax there are no suspicious appearing pulmonary nodules or masses, there is no acute consolidative airspace disease, no pleural effusions, no pneumothorax and no lymphadenopathy. Visualized portions of the upper abdomen are unremarkable. There are no aggressive appearing lytic or blastic lesions noted in the visualized portions of the skeleton. IMPRESSION: 1.  Aortic Atherosclerosis (ICD10-I70.0). Electronically Signed: By: Vinnie Langton M.D. On: 04/02/2021 07:58    Assessment & Plan:   Problem List Items Addressed This Visit       Cardiovascular and Mediastinum   Essential hypertension - Primary    On HCTZ      Relevant Medications   hydrochlorothiazide (MICROZIDE) 12.5 MG capsule   Other Relevant Orders   TSH   Urinalysis   CBC with Differential/Platelet   Comprehensive metabolic panel   Lipid panel   Hemoglobin A1c   Microalbumin / creatinine urine ratio   Atherosclerosis of aorta (HCC)    Statins were discussed Krill oil      Relevant Medications   hydrochlorothiazide (MICROZIDE) 12.5 MG capsule     Endocrine   DM2  (diabetes mellitus, type 2) (HCC)    Cont on Actos, Parlodel, Metformin      Relevant Medications   metFORMIN (GLUCOPHAGE) 1000 MG tablet   pioglitazone (ACTOS) 45 MG tablet   Other Relevant Orders   TSH   Urinalysis   CBC with  Differential/Platelet   Comprehensive metabolic panel   Lipid panel   Hemoglobin A1c   Microalbumin / creatinine urine ratio     Other   Well adult exam   Relevant Orders   TSH   Urinalysis   CBC with Differential/Platelet   Comprehensive metabolic panel   Lipid panel   Vitamin B12 deficiency    On Vit B12         Meds ordered this encounter  Medications   albuterol (VENTOLIN HFA) 108 (90 Base) MCG/ACT inhaler    Sig: Inhale 1 puff into the lungs every 4 (four) hours as needed.    Dispense:  18 g    Refill:  1   Blood Glucose Monitoring Suppl (ONETOUCH VERIO) w/Device KIT    Sig: 1 Units by Does not apply route daily as needed.    Dispense:  1 kit    Refill:  1   bromocriptine (PARLODEL) 2.5 MG tablet    Sig: Take 1 tablet (2.5 mg total) by mouth daily.    Dispense:  90 tablet    Refill:  3   cyclobenzaprine (FLEXERIL) 10 MG tablet    Sig: Take 1 tablet (10 mg total) by mouth 2 (two) times daily as needed.    Dispense:  90 tablet    Refill:  1   famotidine (PEPCID) 40 MG tablet    Sig: Take 1 tablet (40 mg total) by mouth daily.    Dispense:  90 tablet    Refill:  3   fluticasone-salmeterol (ADVAIR) 100-50 MCG/ACT AEPB    Sig: Inhale 1 puff into the lungs 2 (two) times daily.    Dispense:  1 each    Refill:  11   gabapentin (NEURONTIN) 300 MG capsule    Sig: Take 1 capsule (300 mg total) by mouth 3 (three) times daily.    Dispense:  90 capsule    Refill:  5   glucose blood (ONETOUCH VERIO) test strip    Sig: Use as instructed    Dispense:  50 each    Refill:  11   hydrochlorothiazide (MICROZIDE) 12.5 MG capsule    Sig: Take 1 capsule (12.5 mg total) by mouth every morning.    Dispense:  90 capsule    Refill:  3   Lancets  (ONETOUCH ULTRASOFT) lancets    Sig: Use as instructed    Dispense:  100 each    Refill:  12   loratadine (CLARITIN) 10 MG tablet    Sig: TAKE 1 TABLET BY MOUTH ONCE DAILY AS NEEDED FOR ALLERGIES    Dispense:  100 tablet    Refill:  2   metFORMIN (GLUCOPHAGE) 1000 MG tablet    Sig: Take 1 tablet (1,000 mg total) by mouth 2 (two) times daily with a meal.    Dispense:  180 tablet    Refill:  3   pioglitazone (ACTOS) 45 MG tablet    Sig: Take 1 tablet (45 mg total) by mouth daily.    Dispense:  90 tablet    Refill:  3   triamcinolone ointment (KENALOG) 0.5 %    Sig: Apply 1 Application topically 3 (three) times daily. Apply on a blister, skin crack    Dispense:  45 g    Refill:  1   ketoconazole (NIZORAL) 2 % cream    Sig: APPLY 1 CREAM TOPICALLY ONCE DAILY    Dispense:  60 g    Refill:  0      Follow-up:  Return in about 6 months (around 12/07/2022) for Wellness Exam.  Walker Kehr, MD

## 2022-06-06 NOTE — Assessment & Plan Note (Signed)
On HCTZ 

## 2022-06-06 NOTE — Assessment & Plan Note (Signed)
Statins were discussed Krill oil

## 2022-06-06 NOTE — Assessment & Plan Note (Signed)
Cont on Actos, Parlodel, Metformin °

## 2022-06-06 NOTE — Assessment & Plan Note (Signed)
Chronic Pt cancelled PT Ibuprofen prn pc

## 2022-06-08 ENCOUNTER — Telehealth: Payer: Self-pay | Admitting: Internal Medicine

## 2022-06-08 NOTE — Telephone Encounter (Signed)
Called pt no answer LMOM RTC.../lmb 

## 2022-06-08 NOTE — Telephone Encounter (Signed)
Patient called and asked for clarification on Dr. Judeen Hammans notes left on her lab results. She would like a call back at [phone].

## 2022-06-08 NOTE — Telephone Encounter (Signed)
Pt return call back. She states she would rather go on the Rebelsus. She also asked if she could stop taking the metformin & Bromocriptine.Marland KitchenJohny Chess

## 2022-06-11 MED ORDER — RYBELSUS 3 MG PO TABS: 3.0000 mg | ORAL_TABLET | Freq: Every day | ORAL | 2 refills | Status: DC

## 2022-06-11 NOTE — Telephone Encounter (Signed)
We can start Rybelsus 3 mg daily.  In 1 month we can increase to 7 mg daily if tolerated Alexandra [name] can call us).  Stop Actos. Continue to take metformin and bromocriptine Thanks

## 2022-06-13 NOTE — Telephone Encounter (Signed)
Notified pt w/MD response.../lmb 

## 2022-06-17 ENCOUNTER — Other Ambulatory Visit: Payer: Self-pay | Admitting: Internal Medicine

## 2022-07-06 ENCOUNTER — Telehealth: Payer: Self-pay | Admitting: Internal Medicine

## 2022-07-06 NOTE — Telephone Encounter (Signed)
Patient states that per her conversation with Dr. Posey Rea her Rybelsius needs to be increased to 7 mg.  She is presently on 3 mg.  Please send to [facility] on [address], [place].  Please call patient and let her know when this has been done.  [phone].  Patient states that she just got the 3 mg. Refilled - should she double this or what should she do.  Please advise.

## 2022-07-08 MED ORDER — RYBELSUS 7 MG PO TABS: 1.0000 | ORAL_TABLET | Freq: Every day | ORAL | 3 refills | Status: DC

## 2022-07-08 NOTE — Telephone Encounter (Signed)
Called pt and notified her of MD response, pt verbalized understanding

## 2022-07-08 NOTE — Telephone Encounter (Signed)
Will do.  We can go to the next step does in 1 month.  Thank you

## 2022-09-14 ENCOUNTER — Other Ambulatory Visit: Payer: Self-pay | Admitting: Internal Medicine

## 2022-11-03 ENCOUNTER — Encounter (INDEPENDENT_AMBULATORY_CARE_PROVIDER_SITE_OTHER): Payer: Self-pay

## 2022-11-04 ENCOUNTER — Other Ambulatory Visit: Payer: Self-pay | Admitting: Internal Medicine

## 2022-12-05 ENCOUNTER — Ambulatory Visit: Payer: Managed Care, Other (non HMO) | Admitting: Internal Medicine

## 2022-12-05 ENCOUNTER — Encounter: Payer: Self-pay | Admitting: Internal Medicine

## 2022-12-05 VITALS — BP 120/78 | HR 110 | Temp 98.6°F | Wt 170.0 lb

## 2022-12-05 DIAGNOSIS — M5416 Radiculopathy, lumbar region: Secondary | ICD-10-CM | POA: Diagnosis not present

## 2022-12-05 DIAGNOSIS — M5440 Lumbago with sciatica, unspecified side: Secondary | ICD-10-CM | POA: Diagnosis not present

## 2022-12-05 DIAGNOSIS — R21 Rash and other nonspecific skin eruption: Secondary | ICD-10-CM | POA: Insufficient documentation

## 2022-12-05 DIAGNOSIS — E538 Deficiency of other specified B group vitamins: Secondary | ICD-10-CM

## 2022-12-05 DIAGNOSIS — I10 Essential (primary) hypertension: Secondary | ICD-10-CM | POA: Diagnosis not present

## 2022-12-05 DIAGNOSIS — Z7984 Long term (current) use of oral hypoglycemic drugs: Secondary | ICD-10-CM | POA: Diagnosis not present

## 2022-12-05 DIAGNOSIS — Z23 Encounter for immunization: Secondary | ICD-10-CM

## 2022-12-05 DIAGNOSIS — J4520 Mild intermittent asthma, uncomplicated: Secondary | ICD-10-CM

## 2022-12-05 DIAGNOSIS — R112 Nausea with vomiting, unspecified: Secondary | ICD-10-CM | POA: Diagnosis not present

## 2022-12-05 DIAGNOSIS — E1140 Type 2 diabetes mellitus with diabetic neuropathy, unspecified: Secondary | ICD-10-CM | POA: Diagnosis not present

## 2022-12-05 LAB — COMPREHENSIVE METABOLIC PANEL
ALT: 21 U/L (ref 0–35)
AST: 19 U/L (ref 0–37)
Albumin: 4.3 g/dL (ref 3.5–5.2)
Alkaline Phosphatase: 79 U/L (ref 39–117)
BUN: 12 mg/dL (ref 6–23)
CO2: 31 meq/L (ref 19–32)
Calcium: 9.9 mg/dL (ref 8.4–10.5)
Chloride: 101 meq/L (ref 96–112)
Creatinine, Ser: 0.73 mg/dL (ref 0.40–1.20)
GFR: 91.76 mL/min (ref >60.00)
Glucose, Bld: 128 mg/dL — ABNORMAL HIGH (ref 70–99)
Potassium: 4.8 meq/L (ref 3.5–5.1)
Sodium: 143 meq/L (ref 135–145)
Total Bilirubin: 0.3 mg/dL (ref 0.2–1.2)
Total Protein: 7.8 g/dL (ref 6.0–8.3)

## 2022-12-05 LAB — HEMOGLOBIN A1C: Hgb A1c MFr Bld: 7.3 % — ABNORMAL HIGH (ref 4.6–6.5)

## 2022-12-05 LAB — LIPASE: Lipase: 40 U/L (ref 11.0–59.0)

## 2022-12-05 MED ORDER — ONETOUCH VERIO VI STRP: ORAL_STRIP | 3 refills | Status: DC

## 2022-12-05 MED ORDER — ONETOUCH ULTRASOFT LANCETS MISC: 12 refills | Status: AC

## 2022-12-05 MED ORDER — LORATADINE 10 MG PO TABS: ORAL_TABLET | ORAL | 2 refills | Status: DC

## 2022-12-05 MED ORDER — IBUPROFEN 600 MG PO TABS: 600.0000 mg | ORAL_TABLET | Freq: Four times a day (QID) | ORAL | 0 refills | Status: DC | PRN

## 2022-12-05 MED ORDER — HYDROCHLOROTHIAZIDE 12.5 MG PO CAPS: 12.5000 mg | ORAL_CAPSULE | Freq: Every morning | ORAL | 3 refills | Status: DC

## 2022-12-05 MED ORDER — FAMOTIDINE 40 MG PO TABS: 40.0000 mg | ORAL_TABLET | Freq: Every day | ORAL | 3 refills | Status: AC

## 2022-12-05 MED ORDER — FLUTICASONE-SALMETEROL 100-50 MCG/ACT IN AEPB: 1.0000 | INHALATION_SPRAY | Freq: Two times a day (BID) | RESPIRATORY_TRACT | 3 refills | Status: DC

## 2022-12-05 MED ORDER — BROMOCRIPTINE MESYLATE 2.5 MG PO TABS: 2.5000 mg | ORAL_TABLET | Freq: Every day | ORAL | 3 refills | Status: DC

## 2022-12-05 MED ORDER — CYCLOBENZAPRINE HCL 10 MG PO TABS: 10.0000 mg | ORAL_TABLET | Freq: Two times a day (BID) | ORAL | 1 refills | Status: DC | PRN

## 2022-12-05 MED ORDER — ALBUTEROL SULFATE HFA 108 (90 BASE) MCG/ACT IN AERS: 1.0000 | INHALATION_SPRAY | RESPIRATORY_TRACT | 1 refills | Status: DC | PRN

## 2022-12-05 MED ORDER — ONETOUCH VERIO VI STRP: ORAL_STRIP | 11 refills | Status: AC

## 2022-12-05 MED ORDER — METFORMIN HCL 1000 MG PO TABS: 1000.0000 mg | ORAL_TABLET | Freq: Two times a day (BID) | ORAL | 3 refills | Status: DC

## 2022-12-05 MED ORDER — KETOCONAZOLE 2 % EX CREA: TOPICAL_CREAM | CUTANEOUS | 0 refills | Status: DC

## 2022-12-05 MED ORDER — GABAPENTIN 300 MG PO CAPS: 300.0000 mg | ORAL_CAPSULE | Freq: Three times a day (TID) | ORAL | 1 refills | Status: DC

## 2022-12-05 MED ORDER — RYBELSUS 7 MG PO TABS: 7.0000 mg | ORAL_TABLET | Freq: Every day | ORAL | 3 refills | Status: DC

## 2022-12-05 NOTE — Assessment & Plan Note (Signed)
Hold Rybelsus, see if n/v resolved

## 2022-12-05 NOTE — Assessment & Plan Note (Signed)
CT coronary calcium score is 0.  Aortic atherosclerosis, mild.

## 2022-12-05 NOTE — Assessment & Plan Note (Signed)
On Vit B12

## 2022-12-05 NOTE — Assessment & Plan Note (Signed)
Pain is worse. Work less advised. Chiropractor ref was offered

## 2022-12-05 NOTE — Assessment & Plan Note (Signed)
Rash on B legs/feet Derm ref

## 2022-12-05 NOTE — Assessment & Plan Note (Signed)
Advair is not covered - d/c Start [employer]

## 2022-12-05 NOTE — Progress Notes (Signed)
Subjective:  Patient ID: Alexandra [name], female    DOB: [birth year]  Age: 57 y.o. MRN: [id number]  CC: Follow-up (6 MNTH F/U)   HPI Alexandra [name] presents for DM, HTN C/o nausea in AMs C/o LBP - worse  Outpatient Medications Prior to Visit  Medication Sig Dispense Refill   Blood Glucose Monitoring Suppl (ONETOUCH VERIO) w/Device KIT 1 Units by Does not apply route daily as needed. 1 kit 1   Cholecalciferol 1000 UNITS tablet Take 1 tablet (1,000 Units total) by mouth daily. 100 tablet 3   Ginkgo Biloba 60 MG CAPS Take 2 capsules by mouth daily.     pioglitazone (ACTOS) 45 MG tablet Take 1 tablet (45 mg total) by mouth daily. 90 tablet 3   triamcinolone ointment (KENALOG) 0.5 % Apply 1 Application topically 3 (three) times daily. Apply on a blister, skin crack 45 g 1   albuterol (VENTOLIN HFA) 108 (90 Base) MCG/ACT inhaler Inhale 1 puff into the lungs every 4 (four) hours as needed. 18 g 1   bromocriptine (PARLODEL) 2.5 MG tablet Take 1 tablet (2.5 mg total) by mouth daily. 90 tablet 3   cyclobenzaprine (FLEXERIL) 10 MG tablet Take 1 tablet (10 mg total) by mouth 2 (two) times daily as needed. 90 tablet 1   famotidine (PEPCID) 40 MG tablet Take 1 tablet (40 mg total) by mouth daily. 90 tablet 3   fluticasone-salmeterol (ADVAIR) 100-50 MCG/ACT AEPB Inhale 1 puff into the lungs 2 (two) times daily. 1 each 11   gabapentin (NEURONTIN) 300 MG capsule Take 1 capsule (300 mg total) by mouth 3 (three) times daily. 90 capsule 5   glucose blood (ONETOUCH VERIO) test strip And lancets 1/day 100 each 3   glucose blood (ONETOUCH VERIO) test strip Use as instructed 50 each 11   hydrochlorothiazide (MICROZIDE) 12.5 MG capsule Take 1 capsule (12.5 mg total) by mouth every morning. 90 capsule 3   ibuprofen (ADVIL) 600 MG tablet TAKE 1 TABLET BY MOUTH EVERY 6 HOURS AS NEEDED 90 tablet 0   ketoconazole (NIZORAL) 2 % cream APPLY 1 CREAM TOPICALLY ONCE DAILY 60 g 0   Lancets (ONETOUCH ULTRASOFT)  lancets Use as instructed 100 each 12   loratadine (CLARITIN) 10 MG tablet TAKE 1 TABLET BY MOUTH ONCE DAILY AS NEEDED FOR ALLERGIES 100 tablet 2   metFORMIN (GLUCOPHAGE) 1000 MG tablet Take 1 tablet (1,000 mg total) by mouth 2 (two) times daily with a meal. 180 tablet 3   Semaglutide (RYBELSUS) 7 MG TABS TAKE 1 TABLET BY MOUTH ONCE DAILY 30 MINUTES BEFORE MEAL(S) 30 tablet 3   No facility-administered medications prior to visit.    ROS: Review of Systems  Constitutional:  Positive for fatigue and unexpected weight change. Negative for activity change, appetite change and chills.  HENT:  Negative for congestion, mouth sores and sinus pressure.   Eyes:  Negative for visual disturbance.  Respiratory:  Negative for cough and chest tightness.   Gastrointestinal:  Positive for nausea. Negative for abdominal distention, abdominal pain and diarrhea.  Genitourinary:  Negative for difficulty urinating, frequency and vaginal pain.  Musculoskeletal:  Positive for back pain. Negative for gait problem.  Skin:  Positive for rash. Negative for pallor.  Neurological:  Negative for dizziness, tremors, weakness, numbness and headaches.  Psychiatric/Behavioral:  Negative for confusion and sleep disturbance.     Objective:  BP 120/78 (BP Location: Right Arm, Patient Position: Sitting, Cuff Size: Large)   Pulse (!) 110   Temp  98.6 F (37 C) (Oral)   Wt 170 lb (77.1 kg)   LMP  (LMP Unknown)   SpO2 98%   BMI 25.85 kg/m   BP Readings from Last 3 Encounters:  12/05/22 120/78  06/06/22 122/80  12/02/21 120/84    Wt Readings from Last 3 Encounters:  12/05/22 170 lb (77.1 kg)  06/06/22 180 lb (81.6 kg)  12/02/21 179 lb 9.6 oz (81.5 kg)    Physical Exam Constitutional:      General: She is not in acute distress.    Appearance: Normal appearance. She is well-developed.  HENT:     Head: Normocephalic.     Right Ear: External ear normal.     Left Ear: External ear normal.     Nose: Nose normal.   Eyes:     General:        Right eye: No discharge.        Left eye: No discharge.     Conjunctiva/sclera: Conjunctivae normal.     Pupils: Pupils are equal, round, and reactive to light.  Neck:     Thyroid: No thyromegaly.     Vascular: No JVD.     Trachea: No tracheal deviation.  Cardiovascular:     Rate and Rhythm: Normal rate and regular rhythm.     Heart sounds: Normal heart sounds.  Pulmonary:     Effort: No respiratory distress.     Breath sounds: No stridor. No wheezing.  Abdominal:     General: Bowel sounds are normal. There is no distension.     Palpations: Abdomen is soft. There is no mass.     Tenderness: There is no abdominal tenderness. There is no guarding or rebound.     Hernia: No hernia is present.  Musculoskeletal:        General: Tenderness present.     Cervical back: Normal range of motion and neck supple. No rigidity.  Lymphadenopathy:     Cervical: No cervical adenopathy.  Skin:    Findings: Rash present. No erythema.  Neurological:     Cranial Nerves: No cranial nerve deficit.     Motor: No abnormal muscle tone.     Coordination: Coordination normal.     Deep Tendon Reflexes: Reflexes normal.  Psychiatric:        Behavior: Behavior normal.        Thought Content: Thought content normal.        Judgment: Judgment normal.   LS w/pain, stiff, poor posture Rash on B legs/feet Limping, antalgic gait    A total time of 45 minutes was spent preparing to see the patient, reviewing tests, x-rays, operative reports and other medical records.  Also, obtaining history and performing comprehensive physical exam.  Additionally, counseling the patient regarding the above listed issues - LBP, DM. HTN.   Finally, documenting clinical information in the health records, coordination of care, educating the patient.  Lab Results  Component Value Date   WBC 5.6 06/06/2022   HGB 13.4 06/06/2022   HCT 40.8 06/06/2022   PLT 399.0 06/06/2022   GLUCOSE 142 (H)  06/06/2022   CHOL 211 (H) 06/06/2022   TRIG 119.0 06/06/2022   HDL 59.50 06/06/2022   LDLDIRECT 122.0 03/03/2008   LDLCALC 127 (H) 06/06/2022   ALT 39 (H) 06/06/2022   AST 27 06/06/2022   NA 142 06/06/2022   K 4.2 06/06/2022   CL 102 06/06/2022   CREATININE 0.75 06/06/2022   BUN 15 06/06/2022   CO2 29 06/06/2022  TSH 5.04 06/06/2022   HGBA1C 7.5 (H) 06/06/2022   MICROALBUR 1.5 06/06/2022    CT CARDIAC SCORING (SELF PAY ONLY)  Addendum Date: 04/02/2021   ADDENDUM REPORT: 04/02/2021 09:27 CLINICAL DATA:  Cardiovascular Disease Risk stratification EXAM: Coronary Calcium Score TECHNIQUE: A gated, non-contrast computed tomography scan of the heart was performed using 3mm slice thickness. Axial images were analyzed on a dedicated workstation. Calcium scoring of the coronary arteries was performed using the Agatston method. FINDINGS: Coronary Calcium Score: Left main: 0 Left anterior descending artery: 0 Left circumflex artery: 0 Right coronary artery: 0 Total: 0 Percentile: 0 Pericardium: Normal. Ascending Aorta: Normal caliber.  Mild aortic atherosclerosis Non-cardiac: See separate report from [facility]. IMPRESSION: Coronary calcium score of 0. This was 0 percentile for age-, race-, and sex-matched controls. Mild aortic atherosclerosis. RECOMMENDATIONS: Coronary artery calcium (CAC) score is a strong predictor of incident coronary heart disease (CHD) and provides predictive information beyond traditional risk factors. CAC scoring is reasonable to use in the decision to withhold, postpone, or initiate statin therapy in intermediate-risk or selected borderline-risk asymptomatic adults (age 4-75 years and LDL-C >=70 to <190 mg/dL) who do not have diabetes or established atherosclerotic cardiovascular disease (ASCVD).* In intermediate-risk (10-year ASCVD risk >=7.5% to <20%) adults or selected borderline-risk (10-year ASCVD risk >=5% to <7.5%) adults in whom a CAC score is measured for the  purpose of making a treatment decision the following recommendations have been made: If CAC=0, it is reasonable to withhold statin therapy and reassess in 5 to 10 years, as long as higher risk conditions are absent (diabetes mellitus, family history of premature CHD in first degree relatives (males <55 years; females <65 years), cigarette smoking, or LDL >=190 mg/dL). If CAC is 1 to 99, it is reasonable to initiate statin therapy for patients >=76 years of age. If CAC is >=100 or >=75th percentile, it is reasonable to initiate statin therapy at any age. Cardiology referral should be considered for patients with CAC scores >=400 or >=75th percentile. *2018 AHA/ACC/AACVPR/AAPA/ABC/ACPM/ADA/AGS/APhA/ASPC/NLA/PCNA Guideline on the Management of Blood Cholesterol: A Report of the American College of Cardiology/American Heart Association Task Force on Clinical Practice Guidelines. J Am Coll Cardiol. 2019;73(24):3168-3209. Olga Millers, MD Electronically Signed   By: Olga Millers M.D.   On: 04/02/2021 09:27   Result Date: 04/02/2021 EXAM: OVER-READ INTERPRETATION  CT CHEST The following report is an over-read performed by radiologist Dr. Trudie Reed of [facility] on 04/02/2021. This over-read does not include interpretation of cardiac or coronary anatomy or pathology. The coronary calcium score interpretation by the cardiologist is attached. COMPARISON:  None. FINDINGS: Atherosclerotic calcifications in the proximal ascending thoracic aorta. Within the visualized portions of the thorax there are no suspicious appearing pulmonary nodules or masses, there is no acute consolidative airspace disease, no pleural effusions, no pneumothorax and no lymphadenopathy. Visualized portions of the upper abdomen are unremarkable. There are no aggressive appearing lytic or blastic lesions noted in the visualized portions of the skeleton. IMPRESSION: 1.  Aortic Atherosclerosis (ICD10-I70.0). Electronically Signed:  By: Trudie Reed M.D. On: 04/02/2021 07:58    Assessment & Plan:   Problem List Items Addressed This Visit     Essential hypertension    CT coronary calcium score is 0.  Aortic atherosclerosis, mild.      Relevant Medications   hydrochlorothiazide (MICROZIDE) 12.5 MG capsule   Other Relevant Orders   Comprehensive metabolic panel   Hemoglobin A1c   Lipase   Asthma    Advair  is not covered - d/c Start Wixela       Relevant Medications   albuterol (VENTOLIN HFA) 108 (90 Base) MCG/ACT inhaler   fluticasone-salmeterol (WIXELA INHUB) 100-50 MCG/ACT AEPB   LOW BACK PAIN    Chiropractor ref was offered Pain is worse. Work less advised. Chiropractor ref was offered      Relevant Medications   cyclobenzaprine (FLEXERIL) 10 MG tablet   ibuprofen (ADVIL) 600 MG tablet   Other Relevant Orders   Ambulatory referral to Chiropractic   Lumbar radiculopathy - Primary    Pain is worse. Work less advised. Chiropractor ref was offered      Relevant Medications   bromocriptine (PARLODEL) 2.5 MG tablet   cyclobenzaprine (FLEXERIL) 10 MG tablet   gabapentin (NEURONTIN) 300 MG capsule   Other Relevant Orders   Ambulatory referral to Chiropractic   Vitamin B12 deficiency    On Vit B12      DM2 (diabetes mellitus, type 2) (HCC)   Relevant Medications   metFORMIN (GLUCOPHAGE) 1000 MG tablet   Semaglutide (RYBELSUS) 7 MG TABS   Other Relevant Orders   Hemoglobin A1c   Rash    Rash on B legs/feet Derm ref      Relevant Orders   Ambulatory referral to Dermatology   Nausea & vomiting    Hold Rybelsus, see if n/v resolved      Relevant Orders   Comprehensive metabolic panel   Lipase      Meds ordered this encounter  Medications   albuterol (VENTOLIN HFA) 108 (90 Base) MCG/ACT inhaler    Sig: Inhale 1 puff into the lungs every 4 (four) hours as needed.    Dispense:  18 g    Refill:  1   bromocriptine (PARLODEL) 2.5 MG tablet    Sig: Take 1 tablet (2.5 mg total) by  mouth daily.    Dispense:  90 tablet    Refill:  3   cyclobenzaprine (FLEXERIL) 10 MG tablet    Sig: Take 1 tablet (10 mg total) by mouth 2 (two) times daily as needed.    Dispense:  90 tablet    Refill:  1   famotidine (PEPCID) 40 MG tablet    Sig: Take 1 tablet (40 mg total) by mouth daily.    Dispense:  90 tablet    Refill:  3   gabapentin (NEURONTIN) 300 MG capsule    Sig: Take 1 capsule (300 mg total) by mouth 3 (three) times daily.    Dispense:  270 capsule    Refill:  1   glucose blood (ONETOUCH VERIO) test strip    Sig: And lancets 1/day    Dispense:  100 each    Refill:  3   glucose blood (ONETOUCH VERIO) test strip    Sig: Use as instructed    Dispense:  50 each    Refill:  11   hydrochlorothiazide (MICROZIDE) 12.5 MG capsule    Sig: Take 1 capsule (12.5 mg total) by mouth every morning.    Dispense:  90 capsule    Refill:  3   ibuprofen (ADVIL) 600 MG tablet    Sig: Take 1 tablet (600 mg total) by mouth every 6 (six) hours as needed.    Dispense:  90 tablet    Refill:  0   ketoconazole (NIZORAL) 2 % cream    Sig: APPLY 1 CREAM TOPICALLY ONCE DAILY    Dispense:  60 g    Refill:  0   loratadine (  CLARITIN) 10 MG tablet    Sig: TAKE 1 TABLET BY MOUTH ONCE DAILY AS NEEDED FOR ALLERGIES    Dispense:  100 tablet    Refill:  2   metFORMIN (GLUCOPHAGE) 1000 MG tablet    Sig: Take 1 tablet (1,000 mg total) by mouth 2 (two) times daily with a meal.    Dispense:  180 tablet    Refill:  3   Lancets (ONETOUCH ULTRASOFT) lancets    Sig: Use as instructed    Dispense:  100 each    Refill:  12   Semaglutide (RYBELSUS) 7 MG TABS    Sig: Take 1 tablet (7 mg total) by mouth daily.    Dispense:  90 tablet    Refill:  3   fluticasone-salmeterol (WIXELA INHUB) 100-50 MCG/ACT AEPB    Sig: Inhale 1 puff into the lungs 2 (two) times daily.    Dispense:  3 each    Refill:  3      Follow-up: Return in about 3 months (around 03/06/2023) for a follow-up visit.  Sonda Primes, MD

## 2022-12-05 NOTE — Assessment & Plan Note (Addendum)
Chiropractor ref was offered Pain is worse. Work less advised. Chiropractor ref was offered

## 2022-12-20 ENCOUNTER — Other Ambulatory Visit: Payer: Self-pay | Admitting: Internal Medicine

## 2023-01-11 ENCOUNTER — Other Ambulatory Visit: Payer: Self-pay

## 2023-01-11 ENCOUNTER — Ambulatory Visit: Payer: Managed Care, Other (non HMO) | Attending: Neurological Surgery | Admitting: Physical Therapy

## 2023-01-11 ENCOUNTER — Encounter: Payer: Self-pay | Admitting: Physical Therapy

## 2023-01-11 DIAGNOSIS — M5459 Other low back pain: Secondary | ICD-10-CM | POA: Insufficient documentation

## 2023-01-11 DIAGNOSIS — M6281 Muscle weakness (generalized): Secondary | ICD-10-CM | POA: Diagnosis present

## 2023-01-11 NOTE — Therapy (Signed)
OUTPATIENT PHYSICAL THERAPY THORACOLUMBAR EVALUATION  Patient Name: Alexandra [name] MRN: [id number] DOB:10-Nov-1965, 57 y.o., female Today's Date: 01/11/2023   PT End of Session - 01/11/23 0949     Visit Number 1    Number of Visits --   1-2x/week   Date for PT Re-Evaluation 03/08/23    Authorization Type [employer] - FOTO    PT Start Time 0915    PT Stop Time 0955    PT Time Calculation (min) 40 min             Past Medical History:  Diagnosis Date   ALLERGIC RHINITIS    Allergy    SEASONAL   Asthma    CTS (carpal tunnel syndrome)    Diabetes mellitus    Fibroids    uterine   Glaucoma    ?  DENIES 05/14/21   Hx of adenomatous colonic polyps 12/15/2017   Hypertension    Low back pain    Osteoarthritis    Polyarthralgia    Vitamin B12 deficiency    Past Surgical History:  Procedure Laterality Date   BACK SURGERY     2011   cervical bx     COLONOSCOPY     POLYPECTOMY     Patient Active Problem List   Diagnosis Date Noted   Rash 12/05/2022   Nausea & vomiting 12/05/2022   De Quervain's tenosynovitis, right 12/02/2021   Body mass index (BMI) of 25.0 to 29.9 06/07/2021   Retroverted uterus 06/07/2021   Uterine leiomyoma 06/07/2021   Atherosclerosis of aorta (HCC) 04/04/2021   Abnormal TSH 12/07/2020   Tinea pedis 12/10/2019   Diabetic neuropathy (HCC) 09/04/2018   Hx of adenomatous colonic polyps 12/15/2017   Left foot pain 10/07/2016   Well adult exam 01/18/2016   DM2 (diabetes mellitus, type 2) (HCC) 05/01/2014   Leg cramps 12/27/2013   Lumbosacral radiculitis 02/12/2013   Wound, open, foot 09/28/2012   Cramps, extremity 08/27/2012   Insomnia 05/21/2012   CTS (carpal tunnel syndrome)    Unspecified glaucoma    Polyarthralgia    Vitamin B12 deficiency    CHEST PAIN 05/28/2009   Lumbar radiculopathy 09/25/2008   URI 03/18/2008   BRONCHITIS, ACUTE 10/05/2007   HYPOKALEMIA 07/06/2007   Essential hypertension 03/11/2007   ALLERGIC RHINITIS  03/11/2007   Asthma 03/11/2007   OSTEOARTHRITIS 03/08/2007   LOW BACK PAIN 03/08/2007    PCP: Tresa Garter, MD  REFERRING PROVIDER: Arman Bogus, MD  THERAPY DIAG:  Other low back pain - Plan: PT plan of care cert/re-cert  Muscle weakness - Plan: PT plan of care cert/re-cert  REFERRING DIAG: Radiculopathy  Rationale for Evaluation and Treatment:  Rehabilitation  SUBJECTIVE:  PERTINENT PAST HISTORY:  Microdiskectomy (10 years), DM with neuropathy       PRECAUTIONS: None  WEIGHT BEARING RESTRICTIONS No  FALLS:  Has patient fallen in last 6 months? No, Number of falls: But feels off balance  MOI/History of condition:  Onset date: Chronic with significant increase in pain ~3-4 months ago.  SUBJECTIVE STATEMENT  [name] is a 57 y.o. female who presents to clinic with chief complaint of chronic low back and leg pain which has been ongoing for more than 10 years.  In the last 3-4 months the pain has really increased.  She thinks the pain may be linked to doing a large amount of yardwork.  She endorses bil back and L>R LE pain.  She has some residual n/t in her R  toes from her surgery about 10 years ago.  She endorses mostly pain in the L LE vs n/t, she does endorse weakness in both legs.   Red flags:  denies BB changes and saddle anesthesia  Pain:  Are you having pain? Yes Pain location: low back and L>R LE pain NPRS scale:  5/10 to 10/10 Aggravating factors: walking, standing, working Relieving factors: no clear pattern Pain description: constant Stage: Chronic 24 hour pattern: worse with ativity   Occupation: Personal assistant Device: NA  Hand Dominance: NA  Patient Goals/Specific Activities: reduce pain   OBJECTIVE:   DIAGNOSTIC FINDINGS:  Mild degenerative changes lumbar spine MRI 3 years ago  GENERAL OBSERVATION/GAIT:  R lateral shift  SENSATION:  Light touch: Deficits R toes (residual from prior surgery), reported  intermittent n/t L foot (not present on exam)  LUMBAR AROM  AROM AROM  (Eval)  Flexion Fingertips to toes, w/ concordant pain  Extension limited by 50%, w/ concordant pain  Right lateral flexion WNL  Left lateral flexion limited by 25%, w/ concordant pain  Right rotation WNL  Left rotation limited by 50%, w/ concordant pain    (Blank rows = not tested)   LE MMT:  MMT Right (Eval) Left (Eval)  Hip flexion (L2, L3) 4 4  Knee extension (L3) 4 4  Knee flexion    Hip abduction    Hip extension    Hip external rotation    Hip internal rotation    Hip adduction    Ankle dorsiflexion (L4) c c  Ankle plantarflexion (S1) c c  Ankle inversion    Ankle eversion    Great Toe ext (L5) d c  Grossly     (Blank rows = not tested, score listed is out of 5 possible points.  N = WNL, D = diminished, C = clear for gross weakness with myotome testing, * = concordant pain with testing)   SPECIAL TESTS:  Straight leg raise: L (+), R (-) Slump: L (-), R (-)  MUSCLE LENGTH: Hamstrings: Right no restriction; Left no restriction Maisie Fus test: Right significant restriction; Left significant restriction   LE ROM:  ROM Right (Eval) Left (Eval)  Hip flexion    Hip extension    Hip abduction    Hip adduction    Hip internal rotation    Hip external rotation    Knee flexion    Knee extension    Ankle dorsiflexion    Ankle plantarflexion    Ankle inversion    Ankle eversion      (Blank rows = not tested, N = WNL, * = concordant pain with testing)  Functional Tests  Eval (01/11/2023)    10 m max gait speed: 14'', .71 m/s, AD: n    30'' STS: 7x  UE used? y                                                      PATIENT SURVEYS:  Engineer, water, reviewing, and completing below HEP    TODAY'S TREATMENT  Creating, reviewing, and completing below HEP  PATIENT EDUCATION:  POC, diagnosis, prognosis, HEP, and outcome measures.  Pt educated via explanation,  demonstration, and handout (HEP).  Pt confirms understanding verbally.   HOME EXERCISE PROGRAM: Access Code: [email or web address] URL: [email or web address] Date: 01/11/2023 Prepared by: Alphonzo Severance  Exercises - Supine Lower Trunk Rotation  - 1 x daily - 7 x weekly - 1 sets - 20 reps - 3 hold - Supine Hip Adduction Isometric with Ball  - 1 x daily - 7 x weekly - 2 sets - 10 reps - 10'' hold - Hooklying Isometric Clamshell  - 1 x daily - 7 x weekly - 3 sets - 10 reps - Right Standing Lateral Shift Correction at Wall - Repetitions  - 2-3 x daily - 7 x weekly - 1 sets - 15 reps  Treatment priorities   Eval        R lateral shift correction        Core and general LE strengthening in neutral and flexed positions         Traction, TDN?                          ASSESSMENT:  CLINICAL IMPRESSION: Caia is a 57 y.o. female who presents to clinic with signs and sxs consistent with low back pain with L>R sided radicular pain.  Consistent with radiculopathy.  She does have a significant R lateral shift which should be addressed.  She will benefit from skilled PT to address relevant deficits to improve quality of life while completing daily activities involving mobility, sleep, and work.    OBJECTIVE IMPAIRMENTS: Pain, lumbar ROM, LE and core strength, gait, balance  ACTIVITY LIMITATIONS: standing, sitting, transfers, walking, work  PERSONAL FACTORS: See medical history and pertinent history   REHAB POTENTIAL: Good  CLINICAL DECISION MAKING: Stable/uncomplicated  EVALUATION COMPLEXITY: Low   GOALS:   SHORT TERM GOALS: Target date: 02/08/2023  Dyasia will be >75% HEP compliant to improve carryover between sessions and facilitate independent management of condition  Evaluation: ongoing Goal status: INITIAL   LONG TERM GOALS: Target date: 03/08/2023   Shelby will improve FOTO score to 63 as a proxy for functional improvement  Evaluation/Baseline: 54 Goal status:  INITIAL    2.  Caytlyn will self report >/= 50% decrease in pain from evaluation to improve function in daily tasks  Evaluation/Baseline: 10/10 max pain Goal status: INITIAL   3.  Gionni will improve 10 meter max gait speed to .9 m/s (.1 m/s MCID) to show functional improvement in ambulation   Evaluation/Baseline: .71 m/s Goal status: INITIAL   Norms:     4.  Jennavive will improve 30'' STS (MCID 2) to >/= 8x (w/ UE?: N) to show improved LE strength and improved transfers   Evaluation/Baseline: 7x  w/ UE? Y Goal status: INITIAL   5.  Samaree will report confidence in self management of condition at time of discharge with advanced HEP  Evaluation/Baseline: unable to self manage Goal status: INITIAL    6.  Phiona will show significant improvement in R lateral shift  Evaluation/Baseline: significant R lateral shift Goal status: INITIAL   PLAN: PT FREQUENCY: 1-2x/week  PT DURATION: 8 weeks  PLANNED INTERVENTIONS:  [id number]- PT Re-evaluation, 97110-Therapeutic exercises, 97530- Therapeutic activity, [id number]- Neuromuscular re-education, 97535- Self Care, [id number]- Manual therapy, [id number]- Gait training, [id number]- Aquatic Therapy, [phone]- Electrical stimulation (manual), [id number]- Vasopneumatic device, [id number]- Traction (mechanical), [id number]- Ionotophoresis 4mg /ml Dexamethasone, Taping, Dry Needling, Joint manipulation, and Spinal manipulation.   Alphonzo Severance PT, DPT 01/11/2023, 11:56 AM

## 2023-01-20 ENCOUNTER — Ambulatory Visit: Payer: Managed Care, Other (non HMO) | Attending: Neurological Surgery

## 2023-01-20 DIAGNOSIS — R531 Weakness: Secondary | ICD-10-CM | POA: Insufficient documentation

## 2023-01-20 DIAGNOSIS — M6281 Muscle weakness (generalized): Secondary | ICD-10-CM

## 2023-01-20 DIAGNOSIS — M5459 Other low back pain: Secondary | ICD-10-CM | POA: Diagnosis present

## 2023-01-20 DIAGNOSIS — M5431 Sciatica, right side: Secondary | ICD-10-CM | POA: Diagnosis present

## 2023-01-20 NOTE — Therapy (Signed)
OUTPATIENT PHYSICAL THERAPY NOTE  Patient Name: Alexandra [name] MRN: [id number] DOB:1965-12-27, 57 y.o., female Today's Date: 01/20/2023   PT End of Session - 01/20/23 1007     Visit Number 2    Date for PT Re-Evaluation 03/08/23    Authorization Type [employer] - FOTO    PT Start Time 1002    PT Stop Time 1040    PT Time Calculation (min) 38 min    Activity Tolerance Patient limited by pain    Behavior During Therapy WFL for tasks assessed/performed              Past Medical History:  Diagnosis Date   ALLERGIC RHINITIS    Allergy    SEASONAL   Asthma    CTS (carpal tunnel syndrome)    Diabetes mellitus    Fibroids    uterine   Glaucoma    ?  DENIES 05/14/21   Hx of adenomatous colonic polyps 12/15/2017   Hypertension    Low back pain    Osteoarthritis    Polyarthralgia    Vitamin B12 deficiency    Past Surgical History:  Procedure Laterality Date   BACK SURGERY     2011   cervical bx     COLONOSCOPY     POLYPECTOMY     Patient Active Problem List   Diagnosis Date Noted   Rash 12/05/2022   Nausea & vomiting 12/05/2022   De Quervain's tenosynovitis, right 12/02/2021   Body mass index (BMI) of 25.0 to 29.9 06/07/2021   Retroverted uterus 06/07/2021   Uterine leiomyoma 06/07/2021   Atherosclerosis of aorta (HCC) 04/04/2021   Abnormal TSH 12/07/2020   Tinea pedis 12/10/2019   Diabetic neuropathy (HCC) 09/04/2018   Hx of adenomatous colonic polyps 12/15/2017   Left foot pain 10/07/2016   Well adult exam 01/18/2016   DM2 (diabetes mellitus, type 2) (HCC) 05/01/2014   Leg cramps 12/27/2013   Lumbosacral radiculitis 02/12/2013   Wound, open, foot 09/28/2012   Cramps, extremity 08/27/2012   Insomnia 05/21/2012   CTS (carpal tunnel syndrome)    Unspecified glaucoma    Polyarthralgia    Vitamin B12 deficiency    CHEST PAIN 05/28/2009   Lumbar radiculopathy 09/25/2008   URI 03/18/2008   BRONCHITIS, ACUTE 10/05/2007   HYPOKALEMIA 07/06/2007    Essential hypertension 03/11/2007   ALLERGIC RHINITIS 03/11/2007   Asthma 03/11/2007   OSTEOARTHRITIS 03/08/2007   LOW BACK PAIN 03/08/2007    PCP: Tresa Garter, MD  REFERRING PROVIDER: Tresa Garter, MD  THERAPY DIAG:  Other low back pain  Muscle weakness  REFERRING DIAG: Radiculopathy  Rationale for Evaluation and Treatment:  Rehabilitation  SUBJECTIVE:  PERTINENT PAST HISTORY:  Microdiskectomy (10 years), DM with neuropathy       PRECAUTIONS: None  WEIGHT BEARING RESTRICTIONS No  FALLS:  Has patient fallen in last 6 months? No, Number of falls: But feels off balance  MOI/History of condition:  Onset date: Chronic with significant increase in pain ~3-4 months ago.  SUBJECTIVE STATEMENT  01/20/2023: Patient reports 4/10 pain at start of session. She states that she did her exercises once since her  evaluation.   Eval: Alexandra [name] is a 57 y.o. female who presents to clinic with chief complaint of chronic low back and leg pain which has been ongoing for more than 10 years.  In the last 3-4 months the pain has really increased.  She thinks the pain may be linked to doing a large amount  of yardwork.  She endorses bil back and L>R LE pain.  She has some residual n/t in her R toes from her surgery about 10 years ago.  She endorses mostly pain in the L LE vs n/t, she does endorse weakness in both legs.   Red flags:  denies BB changes and saddle anesthesia  Pain:  Are you having pain? Yes Pain location: low back and L>R LE pain NPRS scale:  5/10 to 10/10 Aggravating factors: walking, standing, working Relieving factors: no clear pattern Pain description: constant Stage: Chronic 24 hour pattern: worse with ativity   Occupation: Personal assistant Device: NA  Hand Dominance: NA  Patient Goals/Specific Activities: reduce pain   OBJECTIVE:   DIAGNOSTIC FINDINGS:  Mild degenerative changes lumbar spine MRI 3 years ago  GENERAL  OBSERVATION/GAIT:  R lateral shift  SENSATION:  Light touch: Deficits R toes (residual from prior surgery), reported intermittent n/t L foot (not present on exam)  LUMBAR AROM  AROM AROM  (Eval)  Flexion Fingertips to toes, w/ concordant pain  Extension limited by 50%, w/ concordant pain  Right lateral flexion WNL  Left lateral flexion limited by 25%, w/ concordant pain  Right rotation WNL  Left rotation limited by 50%, w/ concordant pain    (Blank rows = not tested)   LE MMT:  MMT Right (Eval) Left (Eval)  Hip flexion (L2, L3) 4 4  Knee extension (L3) 4 4  Knee flexion    Hip abduction    Hip extension    Hip external rotation    Hip internal rotation    Hip adduction    Ankle dorsiflexion (L4) c c  Ankle plantarflexion (S1) c c  Ankle inversion    Ankle eversion    Great Toe ext (L5) d c  Grossly     (Blank rows = not tested, score listed is out of 5 possible points.  N = WNL, D = diminished, C = clear for gross weakness with myotome testing, * = concordant pain with testing)   SPECIAL TESTS:  Straight leg raise: L (+), R (-) Slump: L (-), R (-)  MUSCLE LENGTH: Hamstrings: Right no restriction; Left no restriction Maisie Fus test: Right significant restriction; Left significant restriction   LE ROM:  ROM Right (Eval) Left (Eval)  Hip flexion    Hip extension    Hip abduction    Hip adduction    Hip internal rotation    Hip external rotation    Knee flexion    Knee extension    Ankle dorsiflexion    Ankle plantarflexion    Ankle inversion    Ankle eversion      (Blank rows = not tested, N = WNL, * = concordant pain with testing)  Functional Tests  Eval     10 m max gait speed: 14'', .71 m/s, AD: n    30'' STS: 7x  UE used? y                                                      PATIENT SURVEYS:  Engineer, water, reviewing, and completing below HEP    TODAY'S TREATMENT   [facility] Adult PT Treatment:  DATE: 01/20/2023  Therapeutic Exercise: NuStep x 5 minutes (increased pain from 4/10 to 7/10)  BIL LTR on pball, 3 sec hold each side x 1 minute total  Supine DKTC with pball x 1 minute  Supine SLR x 15 each LE with cueing for TA activation  Left side plank, 2 x 5 (change to Rt side next visit)  Standing Pallof Press RTB, x 10 each direction  with cueing to decrease lateral shift Right Standing Lateral Shift Correction at Wall, 2 x 10  Reviewed HEP   PATIENT EDUCATION:  POC, diagnosis, prognosis, HEP, and outcome measures.  Pt educated via explanation, demonstration, and handout (HEP).  Pt confirms understanding verbally.   HOME EXERCISE PROGRAM: Access Code: [email or web address] URL: [email or web address] Date: 01/11/2023 Prepared by: Alphonzo Severance  Exercises - Supine Lower Trunk Rotation  - 1 x daily - 7 x weekly - 1 sets - 20 reps - 3 hold - Supine Hip Adduction Isometric with Ball  - 1 x daily - 7 x weekly - 2 sets - 10 reps - 10'' hold - Hooklying Isometric Clamshell  - 1 x daily - 7 x weekly - 3 sets - 10 reps - Right Standing Lateral Shift Correction at Wall - Repetitions  - 2-3 x daily - 7 x weekly - 1 sets - 15 reps  Treatment priorities   Eval        R lateral shift correction        Core and general LE strengthening in neutral and flexed positions         Traction, TDN?                          ASSESSMENT:  CLINICAL IMPRESSION: 01/20/2023: Patient had fair tolerance of initial treatment session today. She does report some pain with exercises, but primarily rates this as 4/10, with exception of NuStep, which briefly increased pain to 8/10. She requires verbal cueing and further interventions to address R lateral shift. Encouraged patient to increase HEP frequency.   EvalAnayansi Rundquist is a 57 y.o. female who presents to clinic with signs and sxs consistent with low back pain with L>R sided radicular pain.  Consistent with radiculopathy.  She does have a  significant R lateral shift which should be addressed.  She will benefit from skilled PT to address relevant deficits to improve quality of life while completing daily activities involving mobility, sleep, and work.    OBJECTIVE IMPAIRMENTS: Pain, lumbar ROM, LE and core strength, gait, balance  ACTIVITY LIMITATIONS: standing, sitting, transfers, walking, work  PERSONAL FACTORS: See medical history and pertinent history   REHAB POTENTIAL: Good  CLINICAL DECISION MAKING: Stable/uncomplicated  EVALUATION COMPLEXITY: Low   GOALS:   SHORT TERM GOALS: Target date: 02/08/2023  Lyndel will be >75% HEP compliant to improve carryover between sessions and facilitate independent management of condition  Evaluation: ongoing Goal status: INITIAL   LONG TERM GOALS: Target date: 03/08/2023   Audre will improve FOTO score to 63 as a proxy for functional improvement  Evaluation/Baseline: 54 Goal status: INITIAL    2.  Kimika will self report >/= 50% decrease in pain from evaluation to improve function in daily tasks  Evaluation/Baseline: 10/10 max pain Goal status: INITIAL   3.  Classie will improve 10 meter max gait speed to .9 m/s (.1 m/s MCID) to show functional improvement in ambulation   Evaluation/Baseline: .71 m/s Goal status: INITIAL   Norms:     4.  Rechel will improve 30'' STS (MCID 2) to >/= 8x (w/ UE?: N) to show improved LE strength and improved transfers   Evaluation/Baseline: 7x  w/ UE? Y Goal status: INITIAL   5.  Charla will report confidence in self management of condition at time of discharge with advanced HEP  Evaluation/Baseline: unable to self manage Goal status: INITIAL    6.  Macall will show significant improvement in R lateral shift  Evaluation/Baseline: significant R lateral shift Goal status: INITIAL   PLAN: PT FREQUENCY: 1-2x/week  PT DURATION: 8 weeks  PLANNED INTERVENTIONS:  97164- PT Re-evaluation, 97110-Therapeutic exercises,  97530- Therapeutic activity, [id number]- Neuromuscular re-education, 97535- Self Care, 53664- Manual therapy, [id number]- Gait training, [id number]- Aquatic Therapy, [phone]- Electrical stimulation (manual), [id number]- Vasopneumatic device, [id number]- Traction (mechanical), [id number]- Ionotophoresis 4mg /ml Dexamethasone, Taping, Dry Needling, Joint manipulation, and Spinal manipulation.   Mauri Reading, PT, DPT   01/20/2023, 10:52 AM

## 2023-01-27 NOTE — Therapy (Signed)
OUTPATIENT PHYSICAL THERAPY NOTE  Patient Name: Rynleigh Staal MRN: [id number] DOB:18-Apr-1965, 57 y.o., female Today's Date: 01/28/2023   PT End of Session - 01/28/23 0823     Visit Number 3    Date for PT Re-Evaluation 03/08/23    Authorization Type [employer] - FOTO    PT Start Time 0818    PT Stop Time 0858    PT Time Calculation (min) 40 min    Activity Tolerance Patient limited by pain    Behavior During Therapy [facility] for tasks assessed/performed               Past Medical History:  Diagnosis Date   ALLERGIC RHINITIS    Allergy    SEASONAL   Asthma    CTS (carpal tunnel syndrome)    Diabetes mellitus    Fibroids    uterine   Glaucoma    ?  DENIES 05/14/21   Hx of adenomatous colonic polyps 12/15/2017   Hypertension    Low back pain    Osteoarthritis    Polyarthralgia    Vitamin B12 deficiency    Past Surgical History:  Procedure Laterality Date   BACK SURGERY     2011   cervical bx     COLONOSCOPY     POLYPECTOMY     Patient Active Problem List   Diagnosis Date Noted   Rash 12/05/2022   Nausea & vomiting 12/05/2022   De Quervain's tenosynovitis, right 12/02/2021   Body mass index (BMI) of 25.0 to 29.9 06/07/2021   Retroverted uterus 06/07/2021   Uterine leiomyoma 06/07/2021   Atherosclerosis of aorta (HCC) 04/04/2021   Abnormal TSH 12/07/2020   Tinea pedis 12/10/2019   Diabetic neuropathy (HCC) 09/04/2018   Hx of adenomatous colonic polyps 12/15/2017   Left foot pain 10/07/2016   Well adult exam 01/18/2016   DM2 (diabetes mellitus, type 2) (HCC) 05/01/2014   Leg cramps 12/27/2013   Lumbosacral radiculitis 02/12/2013   Wound, open, foot 09/28/2012   Cramps, extremity 08/27/2012   Insomnia 05/21/2012   CTS (carpal tunnel syndrome)    Unspecified glaucoma    Polyarthralgia    Vitamin B12 deficiency    CHEST PAIN 05/28/2009   Lumbar radiculopathy 09/25/2008   URI 03/18/2008   BRONCHITIS, ACUTE 10/05/2007   HYPOKALEMIA 07/06/2007    Essential hypertension 03/11/2007   ALLERGIC RHINITIS 03/11/2007   Asthma 03/11/2007   OSTEOARTHRITIS 03/08/2007   LOW BACK PAIN 03/08/2007    PCP: Tresa Garter, MD  REFERRING PROVIDER: Tresa Garter, MD  THERAPY DIAG:  Other low back pain  Muscle weakness  REFERRING DIAG: Radiculopathy  Rationale for Evaluation and Treatment:  Rehabilitation  SUBJECTIVE:  PERTINENT PAST HISTORY:  Microdiskectomy (10 years), DM with neuropathy       PRECAUTIONS: None  WEIGHT BEARING RESTRICTIONS No  FALLS:  Has patient fallen in last 6 months? No, Number of falls: But feels off balance  MOI/History of condition:  Onset date: Chronic with significant increase in pain ~3-4 months ago.  SUBJECTIVE STATEMENT  01/28/2023: Patient is reporting 5/10 pain at start of session, after taking pain medication this morning.   Eval: Airyana Hargus is a 57 y.o. female who presents to clinic with chief complaint of chronic low back and leg pain which has been ongoing for more than 10 years.  In the last 3-4 months the pain has really increased.  She thinks the pain may be linked to doing a large amount of yardwork.  She  endorses bil back and L>R LE pain.  She has some residual n/t in her R toes from her surgery about 10 years ago.  She endorses mostly pain in the L LE vs n/t, she does endorse weakness in both legs.   Red flags:  denies BB changes and saddle anesthesia  Pain:  Are you having pain? Yes Pain location: low back and L>R LE pain NPRS scale:  5/10 to 10/10 Aggravating factors: walking, standing, working Relieving factors: no clear pattern Pain description: constant Stage: Chronic 24 hour pattern: worse with ativity   Occupation: Personal assistant Device: NA  Hand Dominance: NA  Patient Goals/Specific Activities: reduce pain   OBJECTIVE:   DIAGNOSTIC FINDINGS:  Mild degenerative changes lumbar spine MRI 3 years ago  GENERAL OBSERVATION/GAIT:  R  lateral shift  SENSATION:  Light touch: Deficits R toes (residual from prior surgery), reported intermittent n/t L foot (not present on exam)  LUMBAR AROM  AROM AROM  (Eval)  Flexion Fingertips to toes, w/ concordant pain  Extension limited by 50%, w/ concordant pain  Right lateral flexion WNL  Left lateral flexion limited by 25%, w/ concordant pain  Right rotation WNL  Left rotation limited by 50%, w/ concordant pain    (Blank rows = not tested)   LE MMT:  MMT Right (Eval) Left (Eval)  Hip flexion (L2, L3) 4 4  Knee extension (L3) 4 4  Knee flexion    Hip abduction    Hip extension    Hip external rotation    Hip internal rotation    Hip adduction    Ankle dorsiflexion (L4) c c  Ankle plantarflexion (S1) c c  Ankle inversion    Ankle eversion    Great Toe ext (L5) d c  Grossly     (Blank rows = not tested, score listed is out of 5 possible points.  N = WNL, D = diminished, C = clear for gross weakness with myotome testing, * = concordant pain with testing)   SPECIAL TESTS:  Straight leg raise: L (+), R (-) Slump: L (-), R (-)  MUSCLE LENGTH: Hamstrings: Right no restriction; Left no restriction Maisie Fus test: Right significant restriction; Left significant restriction   LE ROM:  ROM Right (Eval) Left (Eval)  Hip flexion    Hip extension    Hip abduction    Hip adduction    Hip internal rotation    Hip external rotation    Knee flexion    Knee extension    Ankle dorsiflexion    Ankle plantarflexion    Ankle inversion    Ankle eversion      (Blank rows = not tested, N = WNL, * = concordant pain with testing)  Functional Tests  Eval     10 m max gait speed: 14'', .71 m/s, AD: n    30'' STS: 7x  UE used? y                                                      PATIENT SURVEYS:  Engineer, water, reviewing, and completing below HEP    TODAY'S TREATMENT   [facility] Adult PT Treatment:  DATE:  01/28/2023  Therapeutic Exercise: BIL LTR on pball, 3 sec hold each side x 1 minute total  Supine DKTC with pball x 1 minute  Supine SLR, 2 x10 each LE with cueing for TA activation  Left side plank, x 10 (change to Rt side next visit)  Standing Pallof Press green TB, x 10 each direction  with cueing to decrease lateral shift Right Standing Lateral Shift Correction at Wall, 2 x 10  NuStep x 5 minutes (increased pain from 4/10 to 7/10)  Reviewed HEP   [facility] Adult PT Treatment:                                                DATE: 01/20/2023  Therapeutic Exercise: NuStep x 5 minutes (increased pain from 4/10 to 7/10)  BIL LTR on pball, 3 sec hold each side x 1 minute total  Supine DKTC with pball x 1 minute  Supine SLR x 15 each LE with cueing for TA activation  Left side plank, 2 x 5 (change to Rt side next visit)  Standing Pallof Press RTB, x 10 each direction  with cueing to decrease lateral shift Right Standing Lateral Shift Correction at Wall, 2 x 10  Reviewed HEP   PATIENT EDUCATION:  POC, diagnosis, prognosis, HEP, and outcome measures.  Pt educated via explanation, demonstration, and handout (HEP).  Pt confirms understanding verbally.   HOME EXERCISE PROGRAM: Access Code: [email or web address] URL: [email or web address] Date: 01/11/2023 Prepared by: Alphonzo Severance  Exercises - Supine Lower Trunk Rotation  - 1 x daily - 7 x weekly - 1 sets - 20 reps - 3 hold - Supine Hip Adduction Isometric with Ball  - 1 x daily - 7 x weekly - 2 sets - 10 reps - 10'' hold - Hooklying Isometric Clamshell  - 1 x daily - 7 x weekly - 3 sets - 10 reps - Right Standing Lateral Shift Correction at Wall - Repetitions  - 2-3 x daily - 7 x weekly - 1 sets - 15 reps  Treatment priorities   Eval        R lateral shift correction        Core and general LE strengthening in neutral and flexed positions         Traction, TDN?                          ASSESSMENT:  CLINICAL  IMPRESSION: 01/28/2023: Patient reports increased pain after NuStep today. However, she was able to tolerate increased repetitions of lateral shift and standing exercises today. We will continue with core and LE strengthening program as indicated.    EvalAoibheann Heesch is a 57 y.o. female who presents to clinic with signs and sxs consistent with low back pain with L>R sided radicular pain.  Consistent with radiculopathy.  She does have a significant R lateral shift which should be addressed.  She will benefit from skilled PT to address relevant deficits to improve quality of life while completing daily activities involving mobility, sleep, and work.    OBJECTIVE IMPAIRMENTS: Pain, lumbar ROM, LE and core strength, gait, balance  ACTIVITY LIMITATIONS: standing, sitting, transfers, walking, work  PERSONAL FACTORS: See medical history and pertinent history   REHAB POTENTIAL: Good  CLINICAL DECISION MAKING: Stable/uncomplicated  EVALUATION COMPLEXITY: Low   GOALS:  SHORT TERM GOALS: Target date: 02/08/2023  Riverly will be >75% HEP compliant to improve carryover between sessions and facilitate independent management of condition  Evaluation: ongoing Goal status: INITIAL   LONG TERM GOALS: Target date: 03/08/2023   Tamey will improve FOTO score to 63 as a proxy for functional improvement  Evaluation/Baseline: 54 Goal status: INITIAL    2.  Quantina will self report >/= 50% decrease in pain from evaluation to improve function in daily tasks  Evaluation/Baseline: 10/10 max pain Goal status: INITIAL   3.  Makinzie will improve 10 meter max gait speed to .9 m/s (.1 m/s MCID) to show functional improvement in ambulation   Evaluation/Baseline: .71 m/s Goal status: INITIAL   Norms:     4.  Jaryia will improve 30'' STS (MCID 2) to >/= 8x (w/ UE?: N) to show improved LE strength and improved transfers   Evaluation/Baseline: 7x  w/ UE? Y Goal status: INITIAL   5.  Fernanda will report  confidence in self management of condition at time of discharge with advanced HEP  Evaluation/Baseline: unable to self manage Goal status: INITIAL    6.  Crystel will show significant improvement in R lateral shift  Evaluation/Baseline: significant R lateral shift Goal status: INITIAL   PLAN: PT FREQUENCY: 1-2x/week  PT DURATION: 8 weeks  PLANNED INTERVENTIONS:  97164- PT Re-evaluation, 97110-Therapeutic exercises, 97530- Therapeutic activity, [id number]- Neuromuscular re-education, 97535- Self Care, 56213- Manual therapy, [id number]- Gait training, [id number]- Aquatic Therapy, [phone]- Electrical stimulation (manual), [id number]- Vasopneumatic device, [id number]- Traction (mechanical), [id number]- Ionotophoresis 4mg /ml Dexamethasone, Taping, Dry Needling, Joint manipulation, and Spinal manipulation.   Mauri Reading, PT, DPT   01/28/2023, 9:02 AM

## 2023-01-28 ENCOUNTER — Ambulatory Visit: Payer: Managed Care, Other (non HMO)

## 2023-01-28 DIAGNOSIS — M6281 Muscle weakness (generalized): Secondary | ICD-10-CM

## 2023-01-28 DIAGNOSIS — M5459 Other low back pain: Secondary | ICD-10-CM

## 2023-02-04 ENCOUNTER — Ambulatory Visit: Payer: Managed Care, Other (non HMO) | Admitting: Physical Therapy

## 2023-02-04 ENCOUNTER — Encounter: Payer: Self-pay | Admitting: Physical Therapy

## 2023-02-04 DIAGNOSIS — M5459 Other low back pain: Secondary | ICD-10-CM

## 2023-02-04 DIAGNOSIS — R531 Weakness: Secondary | ICD-10-CM

## 2023-02-04 DIAGNOSIS — M5431 Sciatica, right side: Secondary | ICD-10-CM

## 2023-02-04 DIAGNOSIS — M6281 Muscle weakness (generalized): Secondary | ICD-10-CM

## 2023-02-04 NOTE — Therapy (Signed)
OUTPATIENT PHYSICAL THERAPY NOTE  Patient Name: Alexandra [name] MRN: [id number] DOB:1965/08/29, 57 y.o., female Today's Date: 02/04/2023   PT End of Session - 02/04/23 0859     Visit Number 4    Date for PT Re-Evaluation 03/08/23    Authorization Type [employer] - FOTO    PT Start Time 0900    PT Stop Time 0941    PT Time Calculation (min) 41 min    Activity Tolerance Patient limited by pain    Behavior During Therapy [facility] for tasks assessed/performed               Past Medical History:  Diagnosis Date   ALLERGIC RHINITIS    Allergy    SEASONAL   Asthma    CTS (carpal tunnel syndrome)    Diabetes mellitus    Fibroids    uterine   Glaucoma    ?  DENIES 05/14/21   Hx of adenomatous colonic polyps 12/15/2017   Hypertension    Low back pain    Osteoarthritis    Polyarthralgia    Vitamin B12 deficiency    Past Surgical History:  Procedure Laterality Date   BACK SURGERY     2011   cervical bx     COLONOSCOPY     POLYPECTOMY     Patient Active Problem List   Diagnosis Date Noted   Rash 12/05/2022   Nausea & vomiting 12/05/2022   De Quervain's tenosynovitis, right 12/02/2021   Body mass index (BMI) of 25.0 to 29.9 06/07/2021   Retroverted uterus 06/07/2021   Uterine leiomyoma 06/07/2021   Atherosclerosis of aorta (HCC) 04/04/2021   Abnormal TSH 12/07/2020   Tinea pedis 12/10/2019   Diabetic neuropathy (HCC) 09/04/2018   Hx of adenomatous colonic polyps 12/15/2017   Left foot pain 10/07/2016   Well adult exam 01/18/2016   DM2 (diabetes mellitus, type 2) (HCC) 05/01/2014   Leg cramps 12/27/2013   Lumbosacral radiculitis 02/12/2013   Wound, open, foot 09/28/2012   Cramps, extremity 08/27/2012   Insomnia 05/21/2012   CTS (carpal tunnel syndrome)    Unspecified glaucoma    Polyarthralgia    Vitamin B12 deficiency    CHEST PAIN 05/28/2009   Lumbar radiculopathy 09/25/2008   URI 03/18/2008   BRONCHITIS, ACUTE 10/05/2007   HYPOKALEMIA 07/06/2007    Essential hypertension 03/11/2007   ALLERGIC RHINITIS 03/11/2007   Asthma 03/11/2007   OSTEOARTHRITIS 03/08/2007   LOW BACK PAIN 03/08/2007    PCP: Tresa Garter, MD  REFERRING PROVIDER: Tresa Garter, MD  THERAPY DIAG:  Other low back pain  Muscle weakness  Decreased strength  Sciatica, right side  REFERRING DIAG: Radiculopathy  Rationale for Evaluation and Treatment:  Rehabilitation  SUBJECTIVE:  PERTINENT PAST HISTORY:  Microdiskectomy (10 years), DM with neuropathy       PRECAUTIONS: None  WEIGHT BEARING RESTRICTIONS No  FALLS:  Has patient fallen in last 6 months? No, Number of falls: But feels off balance  MOI/History of condition:  Onset date: Chronic with significant increase in pain ~3-4 months ago.  SUBJECTIVE STATEMENT  02/04/2023: Pt reports 4/10 pain with medication this morning.  She feels the exercises are not very harmful or helpful so far.  Eval: Alexandra [name] is a 57 y.o. female who presents to clinic with chief complaint of chronic low back and leg pain which has been ongoing for more than 10 years.  In the last 3-4 months the pain has really increased.  She thinks the pain  may be linked to doing a large amount of yardwork.  She endorses bil back and L>R LE pain.  She has some residual n/t in her R toes from her surgery about 10 years ago.  She endorses mostly pain in the L LE vs n/t, she does endorse weakness in both legs.   Red flags:  denies BB changes and saddle anesthesia  Pain:  Are you having pain? Yes Pain location: low back and L>R LE pain NPRS scale:  5/10 to 10/10 Aggravating factors: walking, standing, working Relieving factors: no clear pattern Pain description: constant Stage: Chronic 24 hour pattern: worse with ativity   Occupation: Personal assistant Device: NA  Hand Dominance: NA  Patient Goals/Specific Activities: reduce pain   OBJECTIVE:   DIAGNOSTIC FINDINGS:  Mild degenerative  changes lumbar spine MRI 3 years ago  GENERAL OBSERVATION/GAIT:  R lateral shift  SENSATION:  Light touch: Deficits R toes (residual from prior surgery), reported intermittent n/t L foot (not present on exam)  LUMBAR AROM  AROM AROM  (Eval)  Flexion Fingertips to toes, w/ concordant pain  Extension limited by 50%, w/ concordant pain  Right lateral flexion WNL  Left lateral flexion limited by 25%, w/ concordant pain  Right rotation WNL  Left rotation limited by 50%, w/ concordant pain    (Blank rows = not tested)   LE MMT:  MMT Right (Eval) Left (Eval)  Hip flexion (L2, L3) 4 4  Knee extension (L3) 4 4  Knee flexion    Hip abduction    Hip extension    Hip external rotation    Hip internal rotation    Hip adduction    Ankle dorsiflexion (L4) c c  Ankle plantarflexion (S1) c c  Ankle inversion    Ankle eversion    Great Toe ext (L5) d c  Grossly     (Blank rows = not tested, score listed is out of 5 possible points.  N = WNL, D = diminished, C = clear for gross weakness with myotome testing, * = concordant pain with testing)   SPECIAL TESTS:  Straight leg raise: L (+), R (-) Slump: L (-), R (-)  MUSCLE LENGTH: Hamstrings: Right no restriction; Left no restriction Alexandra [name] test: Right significant restriction; Left significant restriction   LE ROM:  ROM Right (Eval) Left (Eval)  Hip flexion    Hip extension    Hip abduction    Hip adduction    Hip internal rotation    Hip external rotation    Knee flexion    Knee extension    Ankle dorsiflexion    Ankle plantarflexion    Ankle inversion    Ankle eversion      (Blank rows = not tested, N = WNL, * = concordant pain with testing)  Functional Tests  Eval     10 m max gait speed: 14'', .71 m/s, AD: n    30'' STS: 7x  UE used? y                                                      PATIENT SURVEYS:  Engineer, water, reviewing, and completing below HEP    TODAY'S TREATMENT   [facility]  Adult PT Treatment:  DATE: 02/04/2023  Therapeutic Exercise: BIL LTR 2' Supine DKTC with pball x 1 minute  Supine SLR, 2 x10 each LE with cueing for TA activation  R side plank, x 10 ea Standing Pallof Press green TB, x 10 each direction  with cueing to decrease lateral shift Resisted L S/B - 10# Right Standing Lateral Shift Correction at Wall, 2 x 10 With ext x10  NuStep x 5 minutes L2  Manual Therapy  L S/L QL traction/stretch Supine lumbar traction with pball   [facility] Adult PT Treatment:                                                DATE: 01/20/2023  Therapeutic Exercise: NuStep x 5 minutes (increased pain from 4/10 to 7/10)  BIL LTR on pball, 3 sec hold each side x 1 minute total  Supine DKTC with pball x 1 minute  Supine SLR x 15 each LE with cueing for TA activation  Left side plank, 2 x 5 (change to Rt side next visit)  Standing Pallof Press RTB, x 10 each direction  with cueing to decrease lateral shift Right Standing Lateral Shift Correction at Wall, 2 x 10  Reviewed HEP   PATIENT EDUCATION:  POC, diagnosis, prognosis, HEP, and outcome measures.  Pt educated via explanation, demonstration, and handout (HEP).  Pt confirms understanding verbally.   HOME EXERCISE PROGRAM: Access Code: [email or web address] URL: [email or web address] Date: 01/11/2023 Prepared by: Alphonzo Severance  Exercises - Supine Lower Trunk Rotation  - 1 x daily - 7 x weekly - 1 sets - 20 reps - 3 hold - Supine Hip Adduction Isometric with Ball  - 1 x daily - 7 x weekly - 2 sets - 10 reps - 10'' hold - Hooklying Isometric Clamshell  - 1 x daily - 7 x weekly - 3 sets - 10 reps - Right Standing Lateral Shift Correction at Wall - Repetitions  - 2-3 x daily - 7 x weekly - 1 sets - 15 reps  Treatment priorities   Eval        R lateral shift correction        Core and general LE strengthening in neutral and flexed positions         Traction, TDN?                           ASSESSMENT:  CLINICAL IMPRESSION: 02/04/2023: Avonelle tolerated session fair with no adverse reaction.  She tolerates lateral shift correction with minimal increase in pain and is able to tolerate mat and seated exercises with reduce pain today.  Unfortunately, she remains limited in standing activities and more intense exercise d/t pain and L LE sxs.  Will continue to progress as able.  Transient reduction in pain with manual.  Eval: Aliecia is a 57 y.o. female who presents to clinic with signs and sxs consistent with low back pain with L>R sided radicular pain.  Consistent with radiculopathy.  She does have a significant R lateral shift which should be addressed.  She will benefit from skilled PT to address relevant deficits to improve quality of life while completing daily activities involving mobility, sleep, and work.    OBJECTIVE IMPAIRMENTS: Pain, lumbar ROM, LE and core strength, gait, balance  ACTIVITY LIMITATIONS: standing, sitting, transfers, walking, work  PERSONAL FACTORS: See  medical history and pertinent history   REHAB POTENTIAL: Good  CLINICAL DECISION MAKING: Stable/uncomplicated  EVALUATION COMPLEXITY: Low   GOALS:   SHORT TERM GOALS: Target date: 02/08/2023  Arnetha will be >75% HEP compliant to improve carryover between sessions and facilitate independent management of condition  Evaluation: ongoing Goal status: INITIAL   LONG TERM GOALS: Target date: 03/08/2023   Claudene will improve FOTO score to 63 as a proxy for functional improvement  Evaluation/Baseline: 54 Goal status: INITIAL    2.  Raisha will self report >/= 50% decrease in pain from evaluation to improve function in daily tasks  Evaluation/Baseline: 10/10 max pain Goal status: INITIAL   3.  Annielee will improve 10 meter max gait speed to .9 m/s (.1 m/s MCID) to show functional improvement in ambulation   Evaluation/Baseline: .71 m/s Goal status:  INITIAL   Norms:     4.  Emilyjo will improve 30'' STS (MCID 2) to >/= 8x (w/ UE?: N) to show improved LE strength and improved transfers   Evaluation/Baseline: 7x  w/ UE? Y Goal status: INITIAL   5.  Mylinh will report confidence in self management of condition at time of discharge with advanced HEP  Evaluation/Baseline: unable to self manage Goal status: INITIAL    6.  Lilana will show significant improvement in R lateral shift  Evaluation/Baseline: significant R lateral shift Goal status: INITIAL   PLAN: PT FREQUENCY: 1-2x/week  PT DURATION: 8 weeks  PLANNED INTERVENTIONS:  97164- PT Re-evaluation, 97110-Therapeutic exercises, 97530- Therapeutic activity, [id number]- Neuromuscular re-education, 97535- Self Care, 16109- Manual therapy, [id number]- Gait training, [id number]- Aquatic Therapy, [phone]- Electrical stimulation (manual), [id number]- Vasopneumatic device, [id number]- Traction (mechanical), [id number]- Ionotophoresis 4mg /ml Dexamethasone, Taping, Dry Needling, Joint manipulation, and Spinal manipulation.   Kimberlee Nearing Sandip Power PT  02/04/2023, 9:46 AM

## 2023-02-09 NOTE — Therapy (Signed)
OUTPATIENT PHYSICAL THERAPY NOTE  Patient Name: Alexandra [name] MRN: [id number] DOB:Feb 16, 1966, 57 y.o., female Today's Date: 02/11/2023   PT End of Session - 02/11/23 0939     Visit Number 5    Date for PT Re-Evaluation 03/08/23    Authorization Type [employer] - FOTO    PT Start Time 0945    PT Stop Time 1025    PT Time Calculation (min) 40 min    Activity Tolerance Patient limited by pain    Behavior During Therapy [facility] for tasks assessed/performed                Past Medical History:  Diagnosis Date   ALLERGIC RHINITIS    Allergy    SEASONAL   Asthma    CTS (carpal tunnel syndrome)    Diabetes mellitus    Fibroids    uterine   Glaucoma    ?  DENIES 05/14/21   Hx of adenomatous colonic polyps 12/15/2017   Hypertension    Low back pain    Osteoarthritis    Polyarthralgia    Vitamin B12 deficiency    Past Surgical History:  Procedure Laterality Date   BACK SURGERY     2011   cervical bx     COLONOSCOPY     POLYPECTOMY     Patient Active Problem List   Diagnosis Date Noted   Rash 12/05/2022   Nausea & vomiting 12/05/2022   De Quervain's tenosynovitis, right 12/02/2021   Body mass index (BMI) of 25.0 to 29.9 06/07/2021   Retroverted uterus 06/07/2021   Uterine leiomyoma 06/07/2021   Atherosclerosis of aorta (HCC) 04/04/2021   Abnormal TSH 12/07/2020   Tinea pedis 12/10/2019   Diabetic neuropathy (HCC) 09/04/2018   Hx of adenomatous colonic polyps 12/15/2017   Left foot pain 10/07/2016   Well adult exam 01/18/2016   DM2 (diabetes mellitus, type 2) (HCC) 05/01/2014   Leg cramps 12/27/2013   Lumbosacral radiculitis 02/12/2013   Wound, open, foot 09/28/2012   Cramps, extremity 08/27/2012   Insomnia 05/21/2012   CTS (carpal tunnel syndrome)    Unspecified glaucoma    Polyarthralgia    Vitamin B12 deficiency    CHEST PAIN 05/28/2009   Lumbar radiculopathy 09/25/2008   URI 03/18/2008   BRONCHITIS, ACUTE 10/05/2007   HYPOKALEMIA 07/06/2007    Essential hypertension 03/11/2007   ALLERGIC RHINITIS 03/11/2007   Asthma 03/11/2007   OSTEOARTHRITIS 03/08/2007   LOW BACK PAIN 03/08/2007    PCP: Tresa Garter, MD  REFERRING PROVIDER: Arman Bogus, MD  THERAPY DIAG:  Other low back pain  Muscle weakness  REFERRING DIAG: Radiculopathy  Rationale for Evaluation and Treatment:  Rehabilitation  SUBJECTIVE:  PERTINENT PAST HISTORY:  Microdiskectomy (10 years), DM with neuropathy       PRECAUTIONS: None  WEIGHT BEARING RESTRICTIONS No  FALLS:  Has patient fallen in last 6 months? No, Number of falls: But feels off balance  MOI/History of condition:  Onset date: Chronic with significant increase in pain ~3-4 months ago.  SUBJECTIVE STATEMENT  02/11/2023: Patient reports to PT with 3/10 pain.   Eval: Alexandra [name] is a 57 y.o. female who presents to clinic with chief complaint of chronic low back and leg pain which has been ongoing for more than 10 years.  In the last 3-4 months the pain has really increased.  She thinks the pain may be linked to doing a large amount of yardwork.  She endorses bil back and L>R LE pain.  She has some residual n/t in her R toes from her surgery about 10 years ago.  She endorses mostly pain in the L LE vs n/t, she does endorse weakness in both legs.   Red flags:  denies BB changes and saddle anesthesia  Pain:  Are you having pain? Yes Pain location: low back and L>R LE pain NPRS scale:  5/10 to 10/10 Aggravating factors: walking, standing, working Relieving factors: no clear pattern Pain description: constant Stage: Chronic 24 hour pattern: worse with ativity   Occupation: Personal assistant Device: NA  Hand Dominance: NA  Patient Goals/Specific Activities: reduce pain   OBJECTIVE:   DIAGNOSTIC FINDINGS:  Mild degenerative changes lumbar spine MRI 3 years ago  GENERAL OBSERVATION/GAIT:  R lateral shift  SENSATION:  Light touch: Deficits R toes  (residual from prior surgery), reported intermittent n/t L foot (not present on exam)  LUMBAR AROM  AROM AROM  (Eval)  Flexion Fingertips to toes, w/ concordant pain  Extension limited by 50%, w/ concordant pain  Right lateral flexion WNL  Left lateral flexion limited by 25%, w/ concordant pain  Right rotation WNL  Left rotation limited by 50%, w/ concordant pain    (Blank rows = not tested)   LE MMT:  MMT Right (Eval) Left (Eval)  Hip flexion (L2, L3) 4 4  Knee extension (L3) 4 4  Knee flexion    Hip abduction    Hip extension    Hip external rotation    Hip internal rotation    Hip adduction    Ankle dorsiflexion (L4) c c  Ankle plantarflexion (S1) c c  Ankle inversion    Ankle eversion    Great Toe ext (L5) d c  Grossly     (Blank rows = not tested, score listed is out of 5 possible points.  N = WNL, D = diminished, C = clear for gross weakness with myotome testing, * = concordant pain with testing)   SPECIAL TESTS:  Straight leg raise: L (+), R (-) Slump: L (-), R (-)  MUSCLE LENGTH: Hamstrings: Right no restriction; Left no restriction Maisie Fus test: Right significant restriction; Left significant restriction   LE ROM:  ROM Right (Eval) Left (Eval)  Hip flexion    Hip extension    Hip abduction    Hip adduction    Hip internal rotation    Hip external rotation    Knee flexion    Knee extension    Ankle dorsiflexion    Ankle plantarflexion    Ankle inversion    Ankle eversion      (Blank rows = not tested, N = WNL, * = concordant pain with testing)  Functional Tests  Eval     10 m max gait speed: 14'', .71 m/s, AD: n    30'' STS: 7x  UE used? y                                                      PATIENT SURVEYS:  Engineer, water, reviewing, and completing below HEP    TODAY'S TREATMENT   [facility] Adult PT Treatment:  DATE: 02/11/2023  Therapeutic Exercise: BIL LTR 2' Supine  SLR, 2 x10 each LE with cueing for TA activation  R side plank, 2 x 10 ea Standing Pallof Press green TB, x 10 each direction  with cueing to decrease lateral shift Resisted L S/B - 10#, 2 x 10  Right Standing Lateral Shift Correction at Wall, 2 x 10 NuStep x 8 minutes L5 Reviewed and provided additional copy of HEP     [facility] Adult PT Treatment:                                                DATE: 02/04/2023  Therapeutic Exercise: BIL LTR 2' Supine DKTC with pball x 1 minute  Supine SLR, 2 x10 each LE with cueing for TA activation  R side plank, x 10 ea Standing Pallof Press green TB, x 10 each direction  with cueing to decrease lateral shift Resisted L S/B - 10# Right Standing Lateral Shift Correction at Wall, 2 x 10 With ext x10  NuStep x 5 minutes L2  Manual Therapy  L S/L QL traction/stretch Supine lumbar traction with pball   [facility] Adult PT Treatment:                                                DATE: 01/20/2023  Therapeutic Exercise: NuStep x 5 minutes (increased pain from 4/10 to 7/10)  BIL LTR on pball, 3 sec hold each side x 1 minute total  Supine DKTC with pball x 1 minute  Supine SLR x 15 each LE with cueing for TA activation  Left side plank, 2 x 5 (change to Rt side next visit)  Standing Pallof Press RTB, x 10 each direction  with cueing to decrease lateral shift Right Standing Lateral Shift Correction at Wall, 2 x 10  Reviewed HEP   PATIENT EDUCATION:  POC, diagnosis, prognosis, HEP, and outcome measures.  Pt educated via explanation, demonstration, and handout (HEP).  Pt confirms understanding verbally.   HOME EXERCISE PROGRAM: Access Code: [email or web address] URL: [email or web address] Date: 01/11/2023 Prepared by: Alphonzo Severance  Exercises - Supine Lower Trunk Rotation  - 1 x daily - 7 x weekly - 1 sets - 20 reps - 3 hold - Supine Hip Adduction Isometric with Ball  - 1 x daily - 7 x weekly - 2 sets - 10 reps - 10'' hold - Hooklying Isometric  Clamshell  - 1 x daily - 7 x weekly - 3 sets - 10 reps - Right Standing Lateral Shift Correction at Wall - Repetitions  - 2-3 x daily - 7 x weekly - 1 sets - 15 reps  Treatment priorities   Eval        R lateral shift correction        Core and general LE strengthening in neutral and flexed positions         Traction, TDN?                          ASSESSMENT:  CLINICAL IMPRESSION: 02/11/2023: Beckey continues to report some pain with side bending exercises, which is limiting her. Provided patient with updated exercises and encouraged compliance with HEP in order to maximize  progress. Plan is to reassess additional need for skilled PT services.    EvalLucindy Koeneman is a 57 y.o. female who presents to clinic with signs and sxs consistent with low back pain with L>R sided radicular pain.  Consistent with radiculopathy.  She does have a significant R lateral shift which should be addressed.  She will benefit from skilled PT to address relevant deficits to improve quality of life while completing daily activities involving mobility, sleep, and work.    OBJECTIVE IMPAIRMENTS: Pain, lumbar ROM, LE and core strength, gait, balance  ACTIVITY LIMITATIONS: standing, sitting, transfers, walking, work  PERSONAL FACTORS: See medical history and pertinent history   REHAB POTENTIAL: Good  CLINICAL DECISION MAKING: Stable/uncomplicated  EVALUATION COMPLEXITY: Low   GOALS:   SHORT TERM GOALS: Target date: 02/08/2023  Zanab will be >75% HEP compliant to improve carryover between sessions and facilitate independent management of condition  Evaluation: ongoing Goal status: INITIAL   LONG TERM GOALS: Target date: 03/08/2023   Nataleah will improve FOTO score to 63 as a proxy for functional improvement  Evaluation/Baseline: 54 Goal status: INITIAL    2.  Victoria will self report >/= 50% decrease in pain from evaluation to improve function in daily tasks  Evaluation/Baseline: 10/10 max  pain Goal status: INITIAL   3.  Shantara will improve 10 meter max gait speed to .9 m/s (.1 m/s MCID) to show functional improvement in ambulation   Evaluation/Baseline: .71 m/s Goal status: INITIAL   Norms:     4.  Lucresha will improve 30'' STS (MCID 2) to >/= 8x (w/ UE?: N) to show improved LE strength and improved transfers   Evaluation/Baseline: 7x  w/ UE? Y Goal status: INITIAL   5.  Aurielle will report confidence in self management of condition at time of discharge with advanced HEP  Evaluation/Baseline: unable to self manage Goal status: INITIAL    6.  Taleiyah will show significant improvement in R lateral shift  Evaluation/Baseline: significant R lateral shift Goal status: INITIAL   PLAN: PT FREQUENCY: 1-2x/week  PT DURATION: 8 weeks  PLANNED INTERVENTIONS:  97164- PT Re-evaluation, 97110-Therapeutic exercises, 97530- Therapeutic activity, [id number]- Neuromuscular re-education, 97535- Self Care, 62952- Manual therapy, [id number]- Gait training, [id number]- Aquatic Therapy, [phone]- Electrical stimulation (manual), [id number]- Vasopneumatic device, [id number]- Traction (mechanical), [id number]- Ionotophoresis 4mg /ml Dexamethasone, Taping, Dry Needling, Joint manipulation, and Spinal manipulation.   Mauri Reading PT  02/11/2023, 9:39 AM

## 2023-02-11 ENCOUNTER — Ambulatory Visit: Payer: Managed Care, Other (non HMO)

## 2023-02-11 DIAGNOSIS — M5459 Other low back pain: Secondary | ICD-10-CM | POA: Diagnosis not present

## 2023-02-11 DIAGNOSIS — M6281 Muscle weakness (generalized): Secondary | ICD-10-CM

## 2023-02-18 ENCOUNTER — Ambulatory Visit: Payer: Managed Care, Other (non HMO) | Admitting: Physical Therapy

## 2023-02-18 ENCOUNTER — Encounter: Payer: Self-pay | Admitting: Physical Therapy

## 2023-02-18 DIAGNOSIS — M6281 Muscle weakness (generalized): Secondary | ICD-10-CM

## 2023-02-18 DIAGNOSIS — M5459 Other low back pain: Secondary | ICD-10-CM | POA: Diagnosis not present

## 2023-02-18 DIAGNOSIS — R531 Weakness: Secondary | ICD-10-CM

## 2023-02-18 NOTE — Therapy (Signed)
PHYSICAL THERAPY DISCHARGE SUMMARY  Visits from Start of Care: 6  Current functional level related to goals / functional outcomes: See assessment/goals   Remaining deficits: See assessment/goals   Education / Equipment: HEP and D/C plans  Patient agrees to discharge. Patient goals were  partially met . Patient is being discharged due to the patient's request.  Patient Name: Monzerrat Hubanks MRN: [id number] DOB:1965-05-26, 57 y.o., female Today's Date: 02/18/2023   PT End of Session - 02/18/23 0817     Visit Number 6    Date for PT Re-Evaluation 03/08/23    Authorization Type [employer]    PT Start Time [phone]    PT Stop Time 0857    PT Time Calculation (min) 41 min    Activity Tolerance Patient limited by pain    Behavior During Therapy [facility] for tasks assessed/performed                Past Medical History:  Diagnosis Date   ALLERGIC RHINITIS    Allergy    SEASONAL   Asthma    CTS (carpal tunnel syndrome)    Diabetes mellitus    Fibroids    uterine   Glaucoma    ?  DENIES 05/14/21   Hx of adenomatous colonic polyps 12/15/2017   Hypertension    Low back pain    Osteoarthritis    Polyarthralgia    Vitamin B12 deficiency    Past Surgical History:  Procedure Laterality Date   BACK SURGERY     2011   cervical bx     COLONOSCOPY     POLYPECTOMY     Patient Active Problem List   Diagnosis Date Noted   Rash 12/05/2022   Nausea & vomiting 12/05/2022   De Quervain's tenosynovitis, right 12/02/2021   Body mass index (BMI) of 25.0 to 29.9 06/07/2021   Retroverted uterus 06/07/2021   Uterine leiomyoma 06/07/2021   Atherosclerosis of aorta (HCC) 04/04/2021   Abnormal TSH 12/07/2020   Tinea pedis 12/10/2019   Diabetic neuropathy (HCC) 09/04/2018   Hx of adenomatous colonic polyps 12/15/2017   Left foot pain 10/07/2016   Well adult exam 01/18/2016   DM2 (diabetes mellitus, type 2) (HCC) 05/01/2014   Leg cramps 12/27/2013   Lumbosacral radiculitis  02/12/2013   Wound, open, foot 09/28/2012   Cramps, extremity 08/27/2012   Insomnia 05/21/2012   CTS (carpal tunnel syndrome)    Unspecified glaucoma    Polyarthralgia    Vitamin B12 deficiency    CHEST PAIN 05/28/2009   Lumbar radiculopathy 09/25/2008   URI 03/18/2008   BRONCHITIS, ACUTE 10/05/2007   HYPOKALEMIA 07/06/2007   Essential hypertension 03/11/2007   ALLERGIC RHINITIS 03/11/2007   Asthma 03/11/2007   OSTEOARTHRITIS 03/08/2007   LOW BACK PAIN 03/08/2007    PCP: Tresa Garter, MD  REFERRING PROVIDER: Arman Bogus, MD  THERAPY DIAG:  Other low back pain  Muscle weakness  Decreased strength  REFERRING DIAG: Radiculopathy  Rationale for Evaluation and Treatment:  Rehabilitation  SUBJECTIVE:  PERTINENT PAST HISTORY:  Microdiskectomy (10 years), DM with neuropathy       PRECAUTIONS: None  WEIGHT BEARING RESTRICTIONS No  FALLS:  Has patient fallen in last 6 months? No, Number of falls: But feels off balance  MOI/History of condition:  Onset date: Chronic with significant increase in pain ~3-4 months ago.  SUBJECTIVE STATEMENT  02/18/2023: Pt states that her pain is about the same.  She would like to D/C.  Eval:  Tishara Lorch is a 57 y.o. female who presents to clinic with chief complaint of chronic low back and leg pain which has been ongoing for more than 10 years.  In the last 3-4 months the pain has really increased.  She thinks the pain may be linked to doing a large amount of yardwork.  She endorses bil back and L>R LE pain.  She has some residual n/t in her R toes from her surgery about 10 years ago.  She endorses mostly pain in the L LE vs n/t, she does endorse weakness in both legs.   Red flags:  denies BB changes and saddle anesthesia  Pain:  Are you having pain? Yes Pain location: low back and L>R LE pain NPRS scale:  5/10 to 10/10 Aggravating factors: walking, standing, working Relieving factors: no clear pattern Pain  description: constant Stage: Chronic 24 hour pattern: worse with ativity   Occupation: Personal assistant Device: NA  Hand Dominance: NA  Patient Goals/Specific Activities: reduce pain   OBJECTIVE:   DIAGNOSTIC FINDINGS:  Mild degenerative changes lumbar spine MRI 3 years ago  GENERAL OBSERVATION/GAIT:  R lateral shift  SENSATION:  Light touch: Deficits R toes (residual from prior surgery), reported intermittent n/t L foot (not present on exam)  LUMBAR AROM  AROM AROM  (Eval)  Flexion Fingertips to toes, w/ concordant pain  Extension limited by 50%, w/ concordant pain  Right lateral flexion WNL  Left lateral flexion limited by 25%, w/ concordant pain  Right rotation WNL  Left rotation limited by 50%, w/ concordant pain    (Blank rows = not tested)   LE MMT:  MMT Right (Eval) Left (Eval)  Hip flexion (L2, L3) 4 4  Knee extension (L3) 4 4  Knee flexion    Hip abduction    Hip extension    Hip external rotation    Hip internal rotation    Hip adduction    Ankle dorsiflexion (L4) c c  Ankle plantarflexion (S1) c c  Ankle inversion    Ankle eversion    Great Toe ext (L5) d c  Grossly     (Blank rows = not tested, score listed is out of 5 possible points.  N = WNL, D = diminished, C = clear for gross weakness with myotome testing, * = concordant pain with testing)   SPECIAL TESTS:  Straight leg raise: L (+), R (-) Slump: L (-), R (-)  MUSCLE LENGTH: Hamstrings: Right no restriction; Left no restriction Maisie Fus test: Right significant restriction; Left significant restriction   LE ROM:  ROM Right (Eval) Left (Eval)  Hip flexion    Hip extension    Hip abduction    Hip adduction    Hip internal rotation    Hip external rotation    Knee flexion    Knee extension    Ankle dorsiflexion    Ankle plantarflexion    Ankle inversion    Ankle eversion      (Blank rows = not tested, N = WNL, * = concordant pain with testing)  Functional  Tests  Eval     10 m max gait speed: 14'', .71 m/s, AD: n    30'' STS: 7x  UE used? y  PATIENT SURVEYS:  Engineer, water, reviewing, and completing below HEP    TODAY'S TREATMENT   [facility] Adult PT Treatment:                                                DATE: 02/18/2023  Therapeutic Exercise: BIL LTR 2' Supine SLR, 2 x10 each LE with cueing for TA activation  R side plank, 2 x 10 ea 90-90 - 20x Standing Pallof Press green TB, x 10 each direction  with cueing to decrease lateral shift Resisted L S/B - 10#, 2 x 10  Right Standing Lateral Shift Correction at Wall, 2 x 10 NuStep x 8 minutes L5  Therapeutic Activity - collecting information for goals, checking progress, and reviewing with patient    [facility] Adult PT Treatment:                                                DATE: 02/04/2023  Therapeutic Exercise: BIL LTR 2' Supine DKTC with pball x 1 minute  Supine SLR, 2 x10 each LE with cueing for TA activation  R side plank, x 10 ea Standing Pallof Press green TB, x 10 each direction  with cueing to decrease lateral shift Resisted L S/B - 10# Right Standing Lateral Shift Correction at Wall, 2 x 10 With ext x10  NuStep x 5 minutes L2  Manual Therapy  L S/L QL traction/stretch Supine lumbar traction with pball   [facility] Adult PT Treatment:                                                DATE: 01/20/2023  Therapeutic Exercise: NuStep x 5 minutes (increased pain from 4/10 to 7/10)  BIL LTR on pball, 3 sec hold each side x 1 minute total  Supine DKTC with pball x 1 minute  Supine SLR x 15 each LE with cueing for TA activation  Left side plank, 2 x 5 (change to Rt side next visit)  Standing Pallof Press RTB, x 10 each direction  with cueing to decrease lateral shift Right Standing Lateral Shift Correction at Wall, 2 x 10  Reviewed HEP   PATIENT EDUCATION:  POC, diagnosis, prognosis, HEP, and outcome measures.   Pt educated via explanation, demonstration, and handout (HEP).  Pt confirms understanding verbally.   HOME EXERCISE PROGRAM: Access Code: [email or web address] URL: [email or web address] Date: 01/11/2023 Prepared by: Alphonzo Severance  Exercises - Supine Lower Trunk Rotation  - 1 x daily - 7 x weekly - 1 sets - 20 reps - 3 hold - Supine Hip Adduction Isometric with Ball  - 1 x daily - 7 x weekly - 2 sets - 10 reps - 10'' hold - Hooklying Isometric Clamshell  - 1 x daily - 7 x weekly - 3 sets - 10 reps - Right Standing Lateral Shift Correction at Wall - Repetitions  - 2-3 x daily - 7 x weekly - 1 sets - 15 reps  Treatment priorities   Eval        R lateral shift correction        Core and  general LE strengthening in neutral and flexed positions         Traction, TDN?                          ASSESSMENT:  CLINICAL IMPRESSION: 02/18/2023: Riley Lam has progressed fair with therapy.  Improved impairments include: LE/hip/core strength.  Functional improvements include: tranfers, gait speed.  Progressions needed include: continued work at home with HEP.  Barriers to progress include: significant low back pain with radicular sxs.  Please see GOALS section for progress on short term and long term goals established at evaluation.  I recommend D/C home with HEP; pt agrees with plan.   EvalSeven Challa is a 57 y.o. female who presents to clinic with signs and sxs consistent with low back pain with L>R sided radicular pain.  Consistent with radiculopathy.  She does have a significant R lateral shift which should be addressed.  She will benefit from skilled PT to address relevant deficits to improve quality of life while completing daily activities involving mobility, sleep, and work.    OBJECTIVE IMPAIRMENTS: Pain, lumbar ROM, LE and core strength, gait, balance  ACTIVITY LIMITATIONS: standing, sitting, transfers, walking, work  PERSONAL FACTORS: See medical history and pertinent  history   REHAB POTENTIAL: Good  CLINICAL DECISION MAKING: Stable/uncomplicated  EVALUATION COMPLEXITY: Low   GOALS:   SHORT TERM GOALS: Target date: 02/08/2023  Jullia will be >75% HEP compliant to improve carryover between sessions and facilitate independent management of condition  Evaluation: ongoing Goal status: MET   LONG TERM GOALS: Target date: 03/08/2023   Lakeva will improve FOTO score to 63 as a proxy for functional improvement  Evaluation/Baseline: 54 11/30: 55 Goal status: MET    2.  Marnee will self report >/= 50% decrease in pain from evaluation to improve function in daily tasks  Evaluation/Baseline: 10/10 max pain 11/30: Minimal improvement Goal status: MET   3.  Foy will improve 10 meter max gait speed to .9 m/s (.1 m/s MCID) to show functional improvement in ambulation   Evaluation/Baseline: .71 m/s 11/30: 1.43 Goal status: MET   Norms:     4.  Shallon will improve 30'' STS (MCID 2) to >/= 8x (w/ UE?: N) to show improved LE strength and improved transfers   Evaluation/Baseline: 7x  w/ UE? Y 11/30: 9x no UE Goal status: MET   5.  Zobeida will report confidence in self management of condition at time of discharge with advanced HEP  Evaluation/Baseline: unable to self manage 11/30: confident with HEP Goal status: MET    6.  Tanette will show significant improvement in R lateral shift  Evaluation/Baseline: significant R lateral shift 11/30: continued lateral shift Goal status: MET   PLAN: PT FREQUENCY: 1-2x/week  PT DURATION: 8 weeks  PLANNED INTERVENTIONS:  [id number]- PT Re-evaluation, 97110-Therapeutic exercises, 97530- Therapeutic activity, 97112- Neuromuscular re-education, 97535- Self Care, 52841- Manual therapy, [id number]- Gait training, [id number]- Aquatic Therapy, [phone]- Electrical stimulation (manual), [id number]- Vasopneumatic device, [id number]- Traction (mechanical), [id number]- Ionotophoresis 4mg /ml Dexamethasone, Taping, Dry Needling, Joint  manipulation, and Spinal manipulation.   Kimberlee Nearing Pachia Strum PT  02/18/2023, 8:58 AM

## 2023-03-02 ENCOUNTER — Other Ambulatory Visit: Payer: Self-pay | Admitting: Internal Medicine

## 2023-03-06 ENCOUNTER — Encounter: Payer: Self-pay | Admitting: Internal Medicine

## 2023-03-06 ENCOUNTER — Ambulatory Visit: Payer: Managed Care, Other (non HMO) | Admitting: Internal Medicine

## 2023-03-06 VITALS — BP 120/80 | HR 88 | Temp 98.6°F | Ht 68.0 in | Wt 175.0 lb

## 2023-03-06 DIAGNOSIS — M5440 Lumbago with sciatica, unspecified side: Secondary | ICD-10-CM | POA: Diagnosis not present

## 2023-03-06 DIAGNOSIS — E538 Deficiency of other specified B group vitamins: Secondary | ICD-10-CM

## 2023-03-06 DIAGNOSIS — I10 Essential (primary) hypertension: Secondary | ICD-10-CM

## 2023-03-06 DIAGNOSIS — R21 Rash and other nonspecific skin eruption: Secondary | ICD-10-CM

## 2023-03-06 DIAGNOSIS — M5417 Radiculopathy, lumbosacral region: Secondary | ICD-10-CM

## 2023-03-06 DIAGNOSIS — E1140 Type 2 diabetes mellitus with diabetic neuropathy, unspecified: Secondary | ICD-10-CM

## 2023-03-06 DIAGNOSIS — Z7984 Long term (current) use of oral hypoglycemic drugs: Secondary | ICD-10-CM

## 2023-03-06 LAB — COMPREHENSIVE METABOLIC PANEL
ALT: 28 U/L (ref 0–35)
AST: 22 U/L (ref 0–37)
Albumin: 4.2 g/dL (ref 3.5–5.2)
Alkaline Phosphatase: 85 U/L (ref 39–117)
BUN: 11 mg/dL (ref 6–23)
CO2: 28 meq/L (ref 19–32)
Calcium: 9.3 mg/dL (ref 8.4–10.5)
Chloride: 100 meq/L (ref 96–112)
Creatinine, Ser: 0.62 mg/dL (ref 0.40–1.20)
GFR: 99.19 mL/min (ref >60.00)
Glucose, Bld: 95 mg/dL (ref 70–99)
Potassium: 4.2 meq/L (ref 3.5–5.1)
Sodium: 137 meq/L (ref 135–145)
Total Bilirubin: 0.3 mg/dL (ref 0.2–1.2)
Total Protein: 7.8 g/dL (ref 6.0–8.3)

## 2023-03-06 LAB — HEMOGLOBIN A1C: Hgb A1c MFr Bld: 7.5 % — ABNORMAL HIGH (ref 4.6–6.5)

## 2023-03-06 MED ORDER — METFORMIN HCL 1000 MG PO TABS: 1000.0000 mg | ORAL_TABLET | Freq: Two times a day (BID) | ORAL | 3 refills | Status: DC

## 2023-03-06 MED ORDER — BROMOCRIPTINE MESYLATE 2.5 MG PO TABS: 2.5000 mg | ORAL_TABLET | Freq: Every day | ORAL | 3 refills | Status: DC

## 2023-03-06 MED ORDER — LORATADINE 10 MG PO TABS: ORAL_TABLET | ORAL | 2 refills | Status: DC

## 2023-03-06 MED ORDER — RYBELSUS 7 MG PO TABS: 7.0000 mg | ORAL_TABLET | Freq: Every day | ORAL | 3 refills | Status: DC

## 2023-03-06 MED ORDER — IBUPROFEN 600 MG PO TABS: 600.0000 mg | ORAL_TABLET | Freq: Four times a day (QID) | ORAL | 0 refills | Status: DC | PRN

## 2023-03-06 MED ORDER — HYDROCHLOROTHIAZIDE 12.5 MG PO CAPS: 12.5000 mg | ORAL_CAPSULE | Freq: Every morning | ORAL | 3 refills | Status: DC

## 2023-03-06 MED ORDER — GABAPENTIN 300 MG PO CAPS: 300.0000 mg | ORAL_CAPSULE | Freq: Three times a day (TID) | ORAL | 1 refills | Status: DC

## 2023-03-06 NOTE — Progress Notes (Signed)
Subjective:  Patient ID: Alexandra [name], female    DOB: August 31, 1965  Age: 57 y.o. MRN: [id number]  CC: Medical Management of Chronic Issues (3 nth f/u)   HPI Alexandra [name] presents for DM, HTN, LBP  Outpatient Medications Prior to Visit  Medication Sig Dispense Refill   albuterol (VENTOLIN HFA) 108 (90 Base) MCG/ACT inhaler Inhale 1 puff into the lungs every 4 (four) hours as needed. 18 g 1   Blood Glucose Monitoring Suppl (ONETOUCH VERIO) w/Device KIT 1 Units by Does not apply route daily as needed. 1 kit 1   Cholecalciferol 1000 UNITS tablet Take 1 tablet (1,000 Units total) by mouth daily. 100 tablet 3   cyclobenzaprine (FLEXERIL) 10 MG tablet Take 1 tablet (10 mg total) by mouth 2 (two) times daily as needed. 90 tablet 1   diazepam (VALIUM) 5 MG tablet Take 5 mg by mouth daily as needed.     famotidine (PEPCID) 40 MG tablet Take 1 tablet (40 mg total) by mouth daily. 90 tablet 3   fluticasone-salmeterol (WIXELA INHUB) 100-50 MCG/ACT AEPB Inhale 1 puff into the lungs 2 (two) times daily. 3 each 3   Ginkgo Biloba 60 MG CAPS Take 2 capsules by mouth daily.     glucose blood (ONETOUCH VERIO) test strip And lancets 1/day 100 each 3   glucose blood (ONETOUCH VERIO) test strip Use as instructed 50 each 11   ketoconazole (NIZORAL) 2 % cream APPLY 1 CREAM TOPICALLY ONCE DAILY 60 g 0   Lancets (ONETOUCH ULTRASOFT) lancets Use as instructed 100 each 12   lidocaine (LIDODERM) 5 % Place 1 patch onto the skin daily.     triamcinolone ointment (KENALOG) 0.5 % APPLY  OINTMENT TOPICALLY THREE TIMES DAILY. APPLY ON A BLISTER, SKIN CRACK 45 g 1   bromocriptine (PARLODEL) 2.5 MG tablet Take 1 tablet (2.5 mg total) by mouth daily. 90 tablet 3   gabapentin (NEURONTIN) 300 MG capsule Take 1 capsule (300 mg total) by mouth 3 (three) times daily. 270 capsule 1   hydrochlorothiazide (MICROZIDE) 12.5 MG capsule Take 1 capsule (12.5 mg total) by mouth every morning. 90 capsule 3   ibuprofen (ADVIL)  600 MG tablet Take 1 tablet (600 mg total) by mouth every 6 (six) hours as needed. 90 tablet 0   loratadine (CLARITIN) 10 MG tablet TAKE 1 TABLET BY MOUTH ONCE DAILY AS NEEDED FOR ALLERGIES 100 tablet 2   metFORMIN (GLUCOPHAGE) 1000 MG tablet Take 1 tablet (1,000 mg total) by mouth 2 (two) times daily with a meal. 180 tablet 3   pioglitazone (ACTOS) 45 MG tablet Take 1 tablet (45 mg total) by mouth daily. 90 tablet 3   Semaglutide (RYBELSUS) 7 MG TABS Take 1 tablet (7 mg total) by mouth daily. 90 tablet 3   No facility-administered medications prior to visit.    ROS: Review of Systems  Constitutional:  Negative for activity change, appetite change, chills, fatigue and unexpected weight change.  HENT:  Negative for congestion, mouth sores and sinus pressure.   Eyes:  Negative for visual disturbance.  Respiratory:  Negative for cough and chest tightness.   Gastrointestinal:  Negative for abdominal pain and nausea.  Genitourinary:  Negative for difficulty urinating, frequency and vaginal pain.  Musculoskeletal:  Positive for back pain. Negative for gait problem.  Skin:  Negative for pallor and rash.  Neurological:  Negative for dizziness, tremors, weakness, numbness and headaches.  Psychiatric/Behavioral:  Negative for confusion and sleep disturbance.  Objective:  BP 120/80 (BP Location: Left Arm, Patient Position: Sitting, Cuff Size: Normal)   Pulse 88   Temp 98.6 F (37 C) (Oral)   Ht 5\' 8"  (1.727 m)   Wt 175 lb (79.4 kg)   LMP  (LMP Unknown)   SpO2 95%   BMI 26.61 kg/m   BP Readings from Last 3 Encounters:  03/06/23 120/80  12/05/22 120/78  06/06/22 122/80    Wt Readings from Last 3 Encounters:  03/06/23 175 lb (79.4 kg)  12/05/22 170 lb (77.1 kg)  06/06/22 180 lb (81.6 kg)    Physical Exam Constitutional:      General: She is not in acute distress.    Appearance: Normal appearance. She is well-developed.  HENT:     Head: Normocephalic.     Right Ear: External  ear normal.     Left Ear: External ear normal.     Nose: Nose normal.  Eyes:     General:        Right eye: No discharge.        Left eye: No discharge.     Conjunctiva/sclera: Conjunctivae normal.     Pupils: Pupils are equal, round, and reactive to light.  Neck:     Thyroid: No thyromegaly.     Vascular: No JVD.     Trachea: No tracheal deviation.  Cardiovascular:     Rate and Rhythm: Normal rate and regular rhythm.     Heart sounds: Normal heart sounds.  Pulmonary:     Effort: No respiratory distress.     Breath sounds: No stridor. No wheezing.  Abdominal:     General: Bowel sounds are normal. There is no distension.     Palpations: Abdomen is soft. There is no mass.     Tenderness: There is no abdominal tenderness. There is no guarding or rebound.  Musculoskeletal:        General: No tenderness.     Cervical back: Normal range of motion and neck supple. No rigidity.  Lymphadenopathy:     Cervical: No cervical adenopathy.  Skin:    Findings: No erythema or rash.  Neurological:     Cranial Nerves: No cranial nerve deficit.     Motor: No abnormal muscle tone.     Coordination: Coordination normal.     Deep Tendon Reflexes: Reflexes normal.  Psychiatric:        Behavior: Behavior normal.        Thought Content: Thought content normal.        Judgment: Judgment normal.     Lab Results  Component Value Date   WBC 5.6 06/06/2022   HGB 13.4 06/06/2022   HCT 40.8 06/06/2022   PLT 399.0 06/06/2022   GLUCOSE 128 (H) 12/05/2022   CHOL 211 (H) 06/06/2022   TRIG 119.0 06/06/2022   HDL 59.50 06/06/2022   LDLDIRECT 122.0 03/03/2008   LDLCALC 127 (H) 06/06/2022   ALT 21 12/05/2022   AST 19 12/05/2022   NA 143 12/05/2022   K 4.8 12/05/2022   CL 101 12/05/2022   CREATININE 0.73 12/05/2022   BUN 12 12/05/2022   CO2 31 12/05/2022   TSH 5.04 06/06/2022   HGBA1C 7.3 (H) 12/05/2022   MICROALBUR 1.5 06/06/2022    CT CARDIAC SCORING (SELF PAY ONLY) Addendum Date:  04/02/2021 ADDENDUM REPORT: 04/02/2021 09:27 CLINICAL DATA:  Cardiovascular Disease Risk stratification EXAM: Coronary Calcium Score TECHNIQUE: A gated, non-contrast computed tomography scan of the heart was performed using 3mm slice thickness.  Axial images were analyzed on a dedicated workstation. Calcium scoring of the coronary arteries was performed using the Agatston method. FINDINGS: Coronary Calcium Score: Left main: 0 Left anterior descending artery: 0 Left circumflex artery: 0 Right coronary artery: 0 Total: 0 Percentile: 0 Pericardium: Normal. Ascending Aorta: Normal caliber.  Mild aortic atherosclerosis Non-cardiac: See separate report from [facility]. IMPRESSION: Coronary calcium score of 0. This was 0 percentile for age-, race-, and sex-matched controls. Mild aortic atherosclerosis. RECOMMENDATIONS: Coronary artery calcium (CAC) score is a strong predictor of incident coronary heart disease (CHD) and provides predictive information beyond traditional risk factors. CAC scoring is reasonable to use in the decision to withhold, postpone, or initiate statin therapy in intermediate-risk or selected borderline-risk asymptomatic adults (age 64-75 years and LDL-C >=70 to <190 mg/dL) who do not have diabetes or established atherosclerotic cardiovascular disease (ASCVD).* In intermediate-risk (10-year ASCVD risk >=7.5% to <20%) adults or selected borderline-risk (10-year ASCVD risk >=5% to <7.5%) adults in whom a CAC score is measured for the purpose of making a treatment decision the following recommendations have been made: If CAC=0, it is reasonable to withhold statin therapy and reassess in 5 to 10 years, as long as higher risk conditions are absent (diabetes mellitus, family history of premature CHD in first degree relatives (males <55 years; females <65 years), cigarette smoking, or LDL >=190 mg/dL). If CAC is 1 to 99, it is reasonable to initiate statin therapy for patients >=64 years of age. If  CAC is >=100 or >=75th percentile, it is reasonable to initiate statin therapy at any age. Cardiology referral should be considered for patients with CAC scores >=400 or >=75th percentile. *2018 AHA/ACC/AACVPR/AAPA/ABC/ACPM/ADA/AGS/APhA/ASPC/NLA/PCNA Guideline on the Management of Blood Cholesterol: A Report of the American College of Cardiology/American Heart Association Task Force on Clinical Practice Guidelines. J Am Coll Cardiol. 2019;73(24):3168-3209. Olga Millers, MD Electronically Signed   By: Olga Millers M.D.   On: 04/02/2021 09:27   Result Date: 04/02/2021 EXAM: OVER-READ INTERPRETATION  CT CHEST The following report is an over-read performed by radiologist Dr. Trudie Reed of [facility] on 04/02/2021. This over-read does not include interpretation of cardiac or coronary anatomy or pathology. The coronary calcium score interpretation by the cardiologist is attached. COMPARISON:  None. FINDINGS: Atherosclerotic calcifications in the proximal ascending thoracic aorta. Within the visualized portions of the thorax there are no suspicious appearing pulmonary nodules or masses, there is no acute consolidative airspace disease, no pleural effusions, no pneumothorax and no lymphadenopathy. Visualized portions of the upper abdomen are unremarkable. There are no aggressive appearing lytic or blastic lesions noted in the visualized portions of the skeleton. IMPRESSION: 1.  Aortic Atherosclerosis (ICD10-I70.0). Electronically Signed: By: Trudie Reed M.D. On: 04/02/2021 07:58    Assessment & Plan:   Problem List Items Addressed This Visit     Essential hypertension   CT coronary calcium score is 0.  Aortic atherosclerosis, mild. Chronic On HCTZ      Relevant Medications   hydrochlorothiazide (MICROZIDE) 12.5 MG capsule   LOW BACK PAIN   Chiropractor ref was offered Pain is worse. Work less advised. Chiropractor ref was offered      Relevant Medications   ibuprofen  (ADVIL) 600 MG tablet   Vitamin B12 deficiency   On Vit B12      DM2 (diabetes mellitus, type 2) (HCC) - Primary   Relevant Medications   metFORMIN (GLUCOPHAGE) 1000 MG tablet   Semaglutide (RYBELSUS) 7 MG TABS   Other Relevant Orders  Comprehensive metabolic panel   Hemoglobin A1c   Lumbosacral radiculitis   F/u w/NS      Relevant Medications   diazepam (VALIUM) 5 MG tablet   bromocriptine (PARLODEL) 2.5 MG tablet   gabapentin (NEURONTIN) 300 MG capsule   Rash   Rash on B legs/feet Derm appt is pending         Meds ordered this encounter  Medications   bromocriptine (PARLODEL) 2.5 MG tablet    Sig: Take 1 tablet (2.5 mg total) by mouth daily.    Dispense:  90 tablet    Refill:  3   gabapentin (NEURONTIN) 300 MG capsule    Sig: Take 1 capsule (300 mg total) by mouth 3 (three) times daily.    Dispense:  270 capsule    Refill:  1   hydrochlorothiazide (MICROZIDE) 12.5 MG capsule    Sig: Take 1 capsule (12.5 mg total) by mouth every morning.    Dispense:  90 capsule    Refill:  3   metFORMIN (GLUCOPHAGE) 1000 MG tablet    Sig: Take 1 tablet (1,000 mg total) by mouth 2 (two) times daily with a meal.    Dispense:  180 tablet    Refill:  3   Semaglutide (RYBELSUS) 7 MG TABS    Sig: Take 1 tablet (7 mg total) by mouth daily.    Dispense:  90 tablet    Refill:  3   loratadine (CLARITIN) 10 MG tablet    Sig: TAKE 1 TABLET BY MOUTH ONCE DAILY AS NEEDED FOR ALLERGIES    Dispense:  100 tablet    Refill:  2   ibuprofen (ADVIL) 600 MG tablet    Sig: Take 1 tablet (600 mg total) by mouth every 6 (six) hours as needed.    Dispense:  90 tablet    Refill:  0      Follow-up: Return in about 6 months (around 09/04/2023) for Wellness Exam.  Sonda Primes, MD

## 2023-03-06 NOTE — Assessment & Plan Note (Signed)
CT coronary calcium score is 0.  Aortic atherosclerosis, mild. Chronic On HCTZ

## 2023-03-06 NOTE — Assessment & Plan Note (Signed)
On Vit B12 

## 2023-03-06 NOTE — Assessment & Plan Note (Signed)
Chiropractor ref was offered Pain is worse. Work less advised. Chiropractor ref was offered

## 2023-03-06 NOTE — Assessment & Plan Note (Signed)
Rash on B legs/feet Derm appt is pending

## 2023-03-06 NOTE — Assessment & Plan Note (Signed)
F/u w/NS

## 2023-06-01 ENCOUNTER — Other Ambulatory Visit: Payer: Self-pay | Admitting: Internal Medicine

## 2023-09-05 ENCOUNTER — Encounter: Payer: Self-pay | Admitting: Internal Medicine

## 2023-09-05 ENCOUNTER — Ambulatory Visit (INDEPENDENT_AMBULATORY_CARE_PROVIDER_SITE_OTHER): Payer: Managed Care, Other (non HMO) | Admitting: Internal Medicine

## 2023-09-05 VITALS — BP 116/70 | HR 67 | Temp 98.0°F | Ht 68.0 in | Wt 170.0 lb

## 2023-09-05 DIAGNOSIS — I10 Essential (primary) hypertension: Secondary | ICD-10-CM | POA: Diagnosis not present

## 2023-09-05 DIAGNOSIS — E538 Deficiency of other specified B group vitamins: Secondary | ICD-10-CM

## 2023-09-05 DIAGNOSIS — J4520 Mild intermittent asthma, uncomplicated: Secondary | ICD-10-CM

## 2023-09-05 DIAGNOSIS — M5440 Lumbago with sciatica, unspecified side: Secondary | ICD-10-CM | POA: Diagnosis not present

## 2023-09-05 DIAGNOSIS — Z7985 Long-term (current) use of injectable non-insulin antidiabetic drugs: Secondary | ICD-10-CM

## 2023-09-05 DIAGNOSIS — E559 Vitamin D deficiency, unspecified: Secondary | ICD-10-CM | POA: Diagnosis not present

## 2023-09-05 DIAGNOSIS — E1140 Type 2 diabetes mellitus with diabetic neuropathy, unspecified: Secondary | ICD-10-CM

## 2023-09-05 DIAGNOSIS — M5416 Radiculopathy, lumbar region: Secondary | ICD-10-CM | POA: Diagnosis not present

## 2023-09-05 DIAGNOSIS — Z7984 Long term (current) use of oral hypoglycemic drugs: Secondary | ICD-10-CM | POA: Diagnosis not present

## 2023-09-05 LAB — HEMOGLOBIN A1C: Hgb A1c MFr Bld: 7.5 % — ABNORMAL HIGH (ref 4.6–6.5)

## 2023-09-05 LAB — CBC WITH DIFFERENTIAL/PLATELET
Basophils Absolute: 0 10*3/uL (ref 0.0–0.1)
Basophils Relative: 0.7 % (ref 0.0–3.0)
Eosinophils Absolute: 0.1 10*3/uL (ref 0.0–0.7)
Eosinophils Relative: 1.6 % (ref 0.0–5.0)
HCT: 38.8 % (ref 36.0–46.0)
Hemoglobin: 12.8 g/dL (ref 12.0–15.0)
Lymphocytes Relative: 38.8 % (ref 12.0–46.0)
Lymphs Abs: 1.9 10*3/uL (ref 0.7–4.0)
MCHC: 33 g/dL (ref 30.0–36.0)
MCV: 93 fl (ref 78.0–100.0)
Monocytes Absolute: 0.4 10*3/uL (ref 0.1–1.0)
Monocytes Relative: 7.5 % (ref 3.0–12.0)
Neutro Abs: 2.5 10*3/uL (ref 1.4–7.7)
Neutrophils Relative %: 51.4 % (ref 43.0–77.0)
Platelets: 391 10*3/uL (ref 150.0–400.0)
RBC: 4.17 Mil/uL (ref 3.87–5.11)
RDW: 15 % (ref 11.5–15.5)
WBC: 4.8 10*3/uL (ref 4.0–10.5)

## 2023-09-05 LAB — COMPREHENSIVE METABOLIC PANEL WITH GFR
ALT: 28 U/L (ref 0–35)
AST: 25 U/L (ref 0–37)
Albumin: 4.4 g/dL (ref 3.5–5.2)
Alkaline Phosphatase: 99 U/L (ref 39–117)
BUN: 15 mg/dL (ref 6–23)
CO2: 27 meq/L (ref 19–32)
Calcium: 9.7 mg/dL (ref 8.4–10.5)
Chloride: 103 meq/L (ref 96–112)
Creatinine, Ser: 0.64 mg/dL (ref 0.40–1.20)
GFR: 98.09 mL/min (ref >60.00)
Glucose, Bld: 121 mg/dL — ABNORMAL HIGH (ref 70–99)
Potassium: 3.8 meq/L (ref 3.5–5.1)
Sodium: 140 meq/L (ref 135–145)
Total Bilirubin: 0.3 mg/dL (ref 0.2–1.2)
Total Protein: 7.8 g/dL (ref 6.0–8.3)

## 2023-09-05 LAB — VITAMIN B12: Vitamin B-12: >1500 pg/mL — ABNORMAL HIGH (ref 211–911)

## 2023-09-05 LAB — VITAMIN D 25 HYDROXY (VIT D DEFICIENCY, FRACTURES): VITD: 92.09 ng/mL (ref 30.00–100.00)

## 2023-09-05 LAB — TSH: TSH: 1.99 u[IU]/mL (ref 0.35–5.50)

## 2023-09-05 MED ORDER — KETOCONAZOLE 2 % EX CREA: TOPICAL_CREAM | CUTANEOUS | 0 refills | Status: DC

## 2023-09-05 MED ORDER — ALBUTEROL SULFATE HFA 108 (90 BASE) MCG/ACT IN AERS: 1.0000 | INHALATION_SPRAY | RESPIRATORY_TRACT | 1 refills | Status: AC | PRN

## 2023-09-05 MED ORDER — IBUPROFEN 600 MG PO TABS: 600.0000 mg | ORAL_TABLET | Freq: Four times a day (QID) | ORAL | 0 refills | Status: DC | PRN

## 2023-09-05 MED ORDER — BROMOCRIPTINE MESYLATE 2.5 MG PO TABS: 2.5000 mg | ORAL_TABLET | Freq: Every day | ORAL | 3 refills | Status: AC

## 2023-09-05 MED ORDER — METFORMIN HCL 1000 MG PO TABS: 1000.0000 mg | ORAL_TABLET | Freq: Two times a day (BID) | ORAL | 3 refills | Status: AC

## 2023-09-05 MED ORDER — LEVOCETIRIZINE DIHYDROCHLORIDE 5 MG PO TABS: 5.0000 mg | ORAL_TABLET | Freq: Every evening | ORAL | 2 refills | Status: DC

## 2023-09-05 MED ORDER — CYCLOBENZAPRINE HCL 10 MG PO TABS: 10.0000 mg | ORAL_TABLET | Freq: Two times a day (BID) | ORAL | 1 refills | Status: AC | PRN

## 2023-09-05 MED ORDER — TRIAMCINOLONE ACETONIDE 0.5 % EX OINT: TOPICAL_OINTMENT | Freq: Three times a day (TID) | CUTANEOUS | 1 refills | Status: DC

## 2023-09-05 MED ORDER — HYDROCHLOROTHIAZIDE 12.5 MG PO CAPS: 12.5000 mg | ORAL_CAPSULE | Freq: Every morning | ORAL | 3 refills | Status: AC

## 2023-09-05 MED ORDER — RYBELSUS 7 MG PO TABS: 7.0000 mg | ORAL_TABLET | Freq: Every day | ORAL | 3 refills | Status: AC

## 2023-09-05 MED ORDER — GABAPENTIN 300 MG PO CAPS
300.0000 mg | ORAL_CAPSULE | Freq: Three times a day (TID) | ORAL | 1 refills | Status: AC
Start: 2023-09-05 — End: ?

## 2023-09-05 NOTE — Assessment & Plan Note (Signed)
 04/2023  LBP - had another surgery by Dr Waymond Hailey

## 2023-09-05 NOTE — Assessment & Plan Note (Signed)
 CT coronary calcium score is 0.  Aortic atherosclerosis, mild. Chronic On HCTZ

## 2023-09-05 NOTE — Progress Notes (Signed)
 Subjective:  Patient ID: Alexandra [name], female    DOB: 30-Oct-1965  Age: 58 y.o. MRN: [id number]  CC: Medical Management of Chronic Issues ( F/U)   HPI Alexandra [name] presents for LBP - had another surgery; DM, HTN C/o occ hives - last week  Outpatient Medications Prior to Visit  Medication Sig Dispense Refill   Blood Glucose Monitoring Suppl (ONETOUCH VERIO) w/Device KIT 1 Units by Does not apply route daily as needed. 1 kit 1   Cholecalciferol  1000 UNITS tablet Take 1 tablet (1,000 Units total) by mouth daily. 100 tablet 3   diazepam (VALIUM) 5 MG tablet Take 5 mg by mouth daily as needed.     famotidine  (PEPCID ) 40 MG tablet Take 1 tablet (40 mg total) by mouth daily. 90 tablet 3   fluticasone -salmeterol (WIXELA INHUB) 100-50 MCG/ACT AEPB Inhale 1 puff into the lungs 2 (two) times daily. 3 each 3   Ginkgo Biloba 60 MG CAPS Take 2 capsules by mouth daily.     glucose blood (ONETOUCH VERIO) test strip And lancets 1/day 100 each 3   glucose blood (ONETOUCH VERIO) test strip Use as instructed 50 each 11   Lancets (ONETOUCH ULTRASOFT) lancets Use as instructed 100 each 12   lidocaine (LIDODERM) 5 % Place 1 patch onto the skin daily.     loratadine  (CLARITIN ) 10 MG tablet TAKE 1 TABLET BY MOUTH ONCE DAILY AS NEEDED FOR ALLERGIES 100 tablet 2   albuterol  (VENTOLIN  HFA) 108 (90 Base) MCG/ACT inhaler Inhale 1 puff into the lungs every 4 (four) hours as needed. 18 g 1   bromocriptine  (PARLODEL ) 2.5 MG tablet Take 1 tablet (2.5 mg total) by mouth daily. 90 tablet 3   cyclobenzaprine  (FLEXERIL ) 10 MG tablet Take 1 tablet (10 mg total) by mouth 2 (two) times daily as needed. 90 tablet 1   gabapentin  (NEURONTIN ) 300 MG capsule Take 1 capsule (300 mg total) by mouth 3 (three) times daily. 270 capsule 1   hydrochlorothiazide  (MICROZIDE ) 12.5 MG capsule Take 1 capsule (12.5 mg total) by mouth every morning. 90 capsule 3   ibuprofen  (ADVIL ) 600 MG tablet TAKE 1 TABLET BY MOUTH EVERY 6  HOURS AS NEEDED 90 tablet 0   ketoconazole  (NIZORAL ) 2 % cream APPLY 1 CREAM TOPICALLY ONCE DAILY 60 g 0   metFORMIN  (GLUCOPHAGE ) 1000 MG tablet Take 1 tablet (1,000 mg total) by mouth 2 (two) times daily with a meal. 180 tablet 3   Semaglutide  (RYBELSUS ) 7 MG TABS Take 1 tablet (7 mg total) by mouth daily. 90 tablet 3   triamcinolone  ointment (KENALOG ) 0.5 % APPLY  OINTMENT TOPICALLY THREE TIMES DAILY. APPLY ON A BLISTER, SKIN CRACK 45 g 1   No facility-administered medications prior to visit.    ROS: Review of Systems  Constitutional:  Negative for activity change, appetite change, chills, fatigue and unexpected weight change.  HENT:  Negative for congestion, mouth sores and sinus pressure.   Eyes:  Negative for visual disturbance.  Respiratory:  Negative for cough and chest tightness.   Gastrointestinal:  Negative for abdominal pain and nausea.  Genitourinary:  Negative for difficulty urinating, frequency and vaginal pain.  Musculoskeletal:  Negative for back pain and gait problem.  Skin:  Positive for rash. Negative for pallor.  Neurological:  Negative for dizziness, tremors, weakness, numbness and headaches.  Psychiatric/Behavioral:  Negative for confusion and sleep disturbance.     Objective:  BP 116/70   Pulse 67   Temp 98 F (36.7  C) (Oral)   Ht 5' 8 (1.727 m)   Wt 170 lb (77.1 kg)   LMP  (LMP Unknown)   SpO2 96%   BMI 25.85 kg/m   BP Readings from Last 3 Encounters:  09/05/23 116/70  03/06/23 120/80  12/05/22 120/78    Wt Readings from Last 3 Encounters:  09/05/23 170 lb (77.1 kg)  03/06/23 175 lb (79.4 kg)  12/05/22 170 lb (77.1 kg)    Physical Exam Constitutional:      General: She is not in acute distress.    Appearance: Normal appearance. She is well-developed.  HENT:     Head: Normocephalic.     Right Ear: External ear normal.     Left Ear: External ear normal.     Nose: Nose normal.   Eyes:     General:        Right eye: No discharge.         Left eye: No discharge.     Conjunctiva/sclera: Conjunctivae normal.     Pupils: Pupils are equal, round, and reactive to light.   Neck:     Thyroid : No thyromegaly.     Vascular: No JVD.     Trachea: No tracheal deviation.   Cardiovascular:     Rate and Rhythm: Normal rate and regular rhythm.     Heart sounds: Normal heart sounds.  Pulmonary:     Effort: No respiratory distress.     Breath sounds: No stridor. No wheezing.  Abdominal:     General: Bowel sounds are normal. There is no distension.     Palpations: Abdomen is soft. There is no mass.     Tenderness: There is no abdominal tenderness. There is no guarding or rebound.   Musculoskeletal:        General: No tenderness.     Cervical back: Normal range of motion and neck supple. No rigidity.     Right lower leg: No edema.     Left lower leg: No edema.  Lymphadenopathy:     Cervical: No cervical adenopathy.   Skin:    Findings: No erythema or rash.   Neurological:     Cranial Nerves: No cranial nerve deficit.     Motor: No abnormal muscle tone.     Coordination: Coordination normal.     Deep Tendon Reflexes: Reflexes normal.   Psychiatric:        Behavior: Behavior normal.        Thought Content: Thought content normal.        Judgment: Judgment normal.   Scar on LS back - healed LS spine - pain w/ROM   Lab Results  Component Value Date   WBC 5.6 06/06/2022   HGB 13.4 06/06/2022   HCT 40.8 06/06/2022   PLT 399.0 06/06/2022   GLUCOSE 95 03/06/2023   CHOL 211 (H) 06/06/2022   TRIG 119.0 06/06/2022   HDL 59.50 06/06/2022   LDLDIRECT 122.0 03/03/2008   LDLCALC 127 (H) 06/06/2022   ALT 28 03/06/2023   AST 22 03/06/2023   NA 137 03/06/2023   K 4.2 03/06/2023   CL 100 03/06/2023   CREATININE 0.62 03/06/2023   BUN 11 03/06/2023   CO2 28 03/06/2023   TSH 5.04 06/06/2022   HGBA1C 7.5 (H) 03/06/2023   MICROALBUR 1.5 06/06/2022    CT CARDIAC SCORING (SELF PAY ONLY) Addendum Date: 04/02/2021 ADDENDUM  REPORT: 04/02/2021 09:27 CLINICAL DATA:  Cardiovascular Disease Risk stratification EXAM: Coronary Calcium Score TECHNIQUE: A gated, non-contrast computed  tomography scan of the heart was performed using 3mm slice thickness. Axial images were analyzed on a dedicated workstation. Calcium scoring of the coronary arteries was performed using the Agatston method. FINDINGS: Coronary Calcium Score: Left main: 0 Left anterior descending artery: 0 Left circumflex artery: 0 Right coronary artery: 0 Total: 0 Percentile: 0 Pericardium: Normal. Ascending Aorta: Normal caliber.  Mild aortic atherosclerosis Non-cardiac: See separate report from [facility]. IMPRESSION: Coronary calcium score of 0. This was 0 percentile for age-, race-, and sex-matched controls. Mild aortic atherosclerosis. RECOMMENDATIONS: Coronary artery calcium (CAC) score is a strong predictor of incident coronary heart disease (CHD) and provides predictive information beyond traditional risk factors. CAC scoring is reasonable to use in the decision to withhold, postpone, or initiate statin therapy in intermediate-risk or selected borderline-risk asymptomatic adults (age 35-75 years and LDL-C >=70 to <190 mg/dL) who do not have diabetes or established atherosclerotic cardiovascular disease (ASCVD).* In intermediate-risk (10-year ASCVD risk >=7.5% to <20%) adults or selected borderline-risk (10-year ASCVD risk >=5% to <7.5%) adults in whom a CAC score is measured for the purpose of making a treatment decision the following recommendations have been made: If CAC=0, it is reasonable to withhold statin therapy and reassess in 5 to 10 years, as long as higher risk conditions are absent (diabetes mellitus, family history of premature CHD in first degree relatives (males <55 years; females <65 years), cigarette smoking, or LDL >=190 mg/dL). If CAC is 1 to 99, it is reasonable to initiate statin therapy for patients >=72 years of age. If CAC is >=100 or  >=75th percentile, it is reasonable to initiate statin therapy at any age. Cardiology referral should be considered for patients with CAC scores >=400 or >=75th percentile. *2018 AHA/ACC/AACVPR/AAPA/ABC/ACPM/ADA/AGS/APhA/ASPC/NLA/PCNA Guideline on the Management of Blood Cholesterol: A Report of the American College of Cardiology/American Heart Association Task Force on Clinical Practice Guidelines. J Am Coll Cardiol. 2019;73(24):3168-3209. Alexandria Angel, MD Electronically Signed   By: Alexandria Angel M.D.   On: 04/02/2021 09:27   Result Date: 04/02/2021 EXAM: OVER-READ INTERPRETATION  CT CHEST The following report is an over-read performed by radiologist Dr. Alexandria Angel of [facility] on 04/02/2021. This over-read does not include interpretation of cardiac or coronary anatomy or pathology. The coronary calcium score interpretation by the cardiologist is attached. COMPARISON:  None. FINDINGS: Atherosclerotic calcifications in the proximal ascending thoracic aorta. Within the visualized portions of the thorax there are no suspicious appearing pulmonary nodules or masses, there is no acute consolidative airspace disease, no pleural effusions, no pneumothorax and no lymphadenopathy. Visualized portions of the upper abdomen are unremarkable. There are no aggressive appearing lytic or blastic lesions noted in the visualized portions of the skeleton. IMPRESSION: 1.  Aortic Atherosclerosis (ICD10-I70.0). Electronically Signed: By: Alexandria Angel M.D. On: 04/02/2021 07:58    Assessment & Plan:   Problem List Items Addressed This Visit     Essential hypertension - Primary   CT coronary calcium score is 0.  Aortic atherosclerosis, mild. Chronic On HCTZ      Relevant Medications   hydrochlorothiazide  (MICROZIDE ) 12.5 MG capsule   Other Relevant Orders   CBC with Differential/Platelet   Comprehensive metabolic panel with GFR   Hemoglobin A1c   TSH   Vitamin B12   VITAMIN D  25 Hydroxy  (Vit-D Deficiency, Fractures)   Asthma   Advair is not covered - d/c On Wixela       Relevant Medications   albuterol  (VENTOLIN  HFA) 108 (90 Base) MCG/ACT inhaler  LOW BACK PAIN     LBP - had another surgery by Dr Waymond Hailey. Doing better      Relevant Medications   cyclobenzaprine  (FLEXERIL ) 10 MG tablet   ibuprofen  (ADVIL ) 600 MG tablet   Lumbar radiculopathy   04/2023  LBP - had another surgery by Dr Waymond Hailey      Relevant Medications   bromocriptine  (PARLODEL ) 2.5 MG tablet   cyclobenzaprine  (FLEXERIL ) 10 MG tablet   gabapentin  (NEURONTIN ) 300 MG capsule   Vitamin B12 deficiency   DM2 (diabetes mellitus, type 2) (HCC)   Cont on Actos , Parlodel , Metformin       Relevant Medications   metFORMIN  (GLUCOPHAGE ) 1000 MG tablet   Semaglutide  (RYBELSUS ) 7 MG TABS   Other Relevant Orders   CBC with Differential/Platelet   Comprehensive metabolic panel with GFR   Hemoglobin A1c   TSH   Vitamin B12   VITAMIN D  25 Hydroxy (Vit-D Deficiency, Fractures)   Other Visit Diagnoses       Vitamin D  deficiency       Relevant Orders   VITAMIN D  25 Hydroxy (Vit-D Deficiency, Fractures)         Meds ordered this encounter  Medications   albuterol  (VENTOLIN  HFA) 108 (90 Base) MCG/ACT inhaler    Sig: Inhale 1 puff into the lungs every 4 (four) hours as needed.    Dispense:  18 g    Refill:  1   bromocriptine  (PARLODEL ) 2.5 MG tablet    Sig: Take 1 tablet (2.5 mg total) by mouth daily.    Dispense:  90 tablet    Refill:  3   cyclobenzaprine  (FLEXERIL ) 10 MG tablet    Sig: Take 1 tablet (10 mg total) by mouth 2 (two) times daily as needed.    Dispense:  90 tablet    Refill:  1   gabapentin  (NEURONTIN ) 300 MG capsule    Sig: Take 1 capsule (300 mg total) by mouth 3 (three) times daily.    Dispense:  270 capsule    Refill:  1   hydrochlorothiazide  (MICROZIDE ) 12.5 MG capsule    Sig: Take 1 capsule (12.5 mg total) by mouth every morning.    Dispense:  90 capsule     Refill:  3   ibuprofen  (ADVIL ) 600 MG tablet    Sig: Take 1 tablet (600 mg total) by mouth every 6 (six) hours as needed.    Dispense:  90 tablet    Refill:  0   ketoconazole  (NIZORAL ) 2 % cream    Sig: APPLY 1 CREAM TOPICALLY ONCE DAILY    Dispense:  60 g    Refill:  0   metFORMIN  (GLUCOPHAGE ) 1000 MG tablet    Sig: Take 1 tablet (1,000 mg total) by mouth 2 (two) times daily with a meal.    Dispense:  180 tablet    Refill:  3   Semaglutide  (RYBELSUS ) 7 MG TABS    Sig: Take 1 tablet (7 mg total) by mouth daily.    Dispense:  90 tablet    Refill:  3   triamcinolone  ointment (KENALOG ) 0.5 %    Sig: Apply topically 3 (three) times daily.    Dispense:  45 g    Refill:  1   levocetirizine (XYZAL) 5 MG tablet    Sig: Take 1 tablet (5 mg total) by mouth every evening.    Dispense:  90 tablet    Refill:  2      Follow-up: Return in  about 6 months (around 03/06/2024) for a follow-up visit.  Anitra Barn, MD

## 2023-09-05 NOTE — Assessment & Plan Note (Signed)
 Advair is not covered - d/c On Wixela

## 2023-09-05 NOTE — Assessment & Plan Note (Signed)
Cont on Actos, Parlodel, Metformin °

## 2023-09-05 NOTE — Assessment & Plan Note (Signed)
 LBP - had another surgery by Dr Waymond Hailey. Doing better

## 2023-09-06 ENCOUNTER — Ambulatory Visit: Payer: Self-pay | Admitting: Internal Medicine

## 2023-12-10 ENCOUNTER — Other Ambulatory Visit: Payer: Self-pay | Admitting: Internal Medicine

## 2023-12-12 LAB — LAB REPORT - SCANNED
EGFR: 87
HM Hepatitis Screen: NEGATIVE

## 2023-12-26 ENCOUNTER — Other Ambulatory Visit: Payer: Self-pay | Admitting: Internal Medicine

## 2024-02-04 ENCOUNTER — Other Ambulatory Visit: Payer: Self-pay | Admitting: Internal Medicine

## 2024-03-06 ENCOUNTER — Ambulatory Visit: Admitting: Internal Medicine

## 2024-03-06 ENCOUNTER — Other Ambulatory Visit: Payer: Self-pay | Admitting: Internal Medicine

## 2024-03-06 ENCOUNTER — Encounter: Payer: Self-pay | Admitting: Internal Medicine

## 2024-03-06 VITALS — BP 142/82 | HR 100 | Temp 98.2°F | Ht 68.0 in | Wt 172.2 lb

## 2024-03-06 DIAGNOSIS — J4520 Mild intermittent asthma, uncomplicated: Secondary | ICD-10-CM

## 2024-03-06 DIAGNOSIS — E538 Deficiency of other specified B group vitamins: Secondary | ICD-10-CM | POA: Diagnosis not present

## 2024-03-06 DIAGNOSIS — M5440 Lumbago with sciatica, unspecified side: Secondary | ICD-10-CM

## 2024-03-06 DIAGNOSIS — Z23 Encounter for immunization: Secondary | ICD-10-CM | POA: Diagnosis not present

## 2024-03-06 DIAGNOSIS — E1140 Type 2 diabetes mellitus with diabetic neuropathy, unspecified: Secondary | ICD-10-CM | POA: Diagnosis not present

## 2024-03-06 DIAGNOSIS — I10 Essential (primary) hypertension: Secondary | ICD-10-CM | POA: Diagnosis not present

## 2024-03-06 DIAGNOSIS — R21 Rash and other nonspecific skin eruption: Secondary | ICD-10-CM | POA: Diagnosis not present

## 2024-03-06 DIAGNOSIS — L608 Other nail disorders: Secondary | ICD-10-CM | POA: Diagnosis not present

## 2024-03-06 LAB — COMPREHENSIVE METABOLIC PANEL WITH GFR
ALT: 29 U/L (ref 3–35)
AST: 26 U/L (ref 5–37)
Albumin: 4.4 g/dL (ref 3.5–5.2)
Alkaline Phosphatase: 92 U/L (ref 39–117)
BUN: 16 mg/dL (ref 6–23)
CO2: 30 meq/L (ref 19–32)
Calcium: 9.7 mg/dL (ref 8.4–10.5)
Chloride: 103 meq/L (ref 96–112)
Creatinine, Ser: 0.64 mg/dL (ref 0.40–1.20)
GFR: 97.75 mL/min (ref >60.00)
Glucose, Bld: 140 mg/dL — ABNORMAL HIGH (ref 70–99)
Potassium: 3.7 meq/L (ref 3.5–5.1)
Sodium: 141 meq/L (ref 135–145)
Total Bilirubin: 0.2 mg/dL (ref 0.2–1.2)
Total Protein: 7.4 g/dL (ref 6.0–8.3)

## 2024-03-06 LAB — HEMOGLOBIN A1C: Hgb A1c MFr Bld: 7.2 % — ABNORMAL HIGH (ref 4.6–6.5)

## 2024-03-06 MED ORDER — LEVOCETIRIZINE DIHYDROCHLORIDE 5 MG PO TABS: 5.0000 mg | ORAL_TABLET | Freq: Every evening | ORAL | 2 refills | Status: AC

## 2024-03-06 MED ORDER — IBUPROFEN 600 MG PO TABS: 600.0000 mg | ORAL_TABLET | Freq: Four times a day (QID) | ORAL | 2 refills | Status: AC | PRN

## 2024-03-06 MED ORDER — ONETOUCH VERIO VI STRP: ORAL_STRIP | 3 refills | Status: AC

## 2024-03-06 MED ORDER — GABAPENTIN 300 MG PO CAPS: 300.0000 mg | ORAL_CAPSULE | Freq: Three times a day (TID) | ORAL | 1 refills | Status: AC

## 2024-03-06 MED ORDER — MONTELUKAST SODIUM 10 MG PO TABS: 10.0000 mg | ORAL_TABLET | Freq: Every day | ORAL | 3 refills | Status: AC

## 2024-03-06 MED ORDER — TRIAMCINOLONE ACETONIDE 0.1 % EX CREA: 1.0000 | TOPICAL_CREAM | Freq: Two times a day (BID) | CUTANEOUS | 3 refills | Status: AC

## 2024-03-06 NOTE — Addendum Note (Signed)
 Addended byBETHA LUCETTA CLEATRICE LELON on: 03/06/2024 08:48 AM   Modules accepted: Orders

## 2024-03-06 NOTE — Progress Notes (Signed)
 Subjective:  Patient ID: Bascom Jonette Lesches, female    DOB: Nov 30, 1965  Age: 58 y.o. MRN: [id number]  CC: Medical Management of Chronic Issues (6 Month follow up)   HPI Bascom Jonette Lesches presents for DM, HTN C/o nail discoloration  Outpatient Medications Prior to Visit  Medication Sig Dispense Refill   albuterol  (VENTOLIN  HFA) 108 (90 Base) MCG/ACT inhaler Inhale 1 puff into the lungs every 4 (four) hours as needed. 18 g 1   Blood Glucose Monitoring Suppl (ONETOUCH VERIO) w/Device KIT 1 Units by Does not apply route daily as needed. 1 kit 1   bromocriptine  (PARLODEL ) 2.5 MG tablet Take 1 tablet (2.5 mg total) by mouth daily. 90 tablet 3   Cholecalciferol  1000 UNITS tablet Take 1 tablet (1,000 Units total) by mouth daily. 100 tablet 3   cyclobenzaprine  (FLEXERIL ) 10 MG tablet Take 1 tablet (10 mg total) by mouth 2 (two) times daily as needed. 90 tablet 1   diazepam (VALIUM) 5 MG tablet Take 5 mg by mouth daily as needed.     famotidine  (PEPCID ) 40 MG tablet Take 1 tablet (40 mg total) by mouth daily. 90 tablet 3   fluticasone -salmeterol (ADVAIR) 100-50 MCG/ACT AEPB INHALE 1 PUFF TWICE DAILY 180 each 0   Ginkgo Biloba 60 MG CAPS Take 2 capsules by mouth daily.     glucose blood (ONETOUCH VERIO) test strip Use as instructed 50 each 11   hydrochlorothiazide  (MICROZIDE ) 12.5 MG capsule Take 1 capsule (12.5 mg total) by mouth every morning. 90 capsule 3   ketoconazole  (NIZORAL ) 2 % cream APPLY  CREAM TOPICALLY ONCE DAILY 60 g 1   Lancets (ONETOUCH ULTRASOFT) lancets Use as instructed 100 each 12   lidocaine (LIDODERM) 5 % Place 1 patch onto the skin daily.     metFORMIN  (GLUCOPHAGE ) 1000 MG tablet Take 1 tablet (1,000 mg total) by mouth 2 (two) times daily with a meal. 180 tablet 3   Semaglutide  (RYBELSUS ) 7 MG TABS Take 1 tablet (7 mg total) by mouth daily. 90 tablet 3   gabapentin  (NEURONTIN ) 300 MG capsule Take 1 capsule (300 mg total) by mouth 3 (three) times daily. 270 capsule 1    glucose blood (ONETOUCH VERIO) test strip And lancets 1/day 100 each 3   ibuprofen  (ADVIL ) 600 MG tablet TAKE 1 TABLET BY MOUTH EVERY 6 HOURS AS NEEDED 90 tablet 0   levocetirizine (XYZAL ) 5 MG tablet Take 1 tablet (5 mg total) by mouth every evening. 90 tablet 2   loratadine  (CLARITIN ) 10 MG tablet TAKE 1 TABLET BY MOUTH ONCE DAILY AS NEEDED FOR ALLERGIES 100 tablet 2   triamcinolone  ointment (KENALOG ) 0.5 % Apply topically 3 (three) times daily. 45 g 1   No facility-administered medications prior to visit.    ROS: Review of Systems  Constitutional:  Negative for activity change, appetite change, chills, fatigue and unexpected weight change.  HENT:  Negative for congestion, mouth sores and sinus pressure.   Eyes:  Negative for visual disturbance.  Respiratory:  Negative for cough and chest tightness.   Gastrointestinal:  Negative for abdominal pain and nausea.  Genitourinary:  Negative for difficulty urinating, frequency and vaginal pain.  Musculoskeletal:  Positive for back pain. Negative for gait problem.  Skin:  Positive for rash. Negative for pallor.  Neurological:  Negative for dizziness, tremors, weakness, numbness and headaches.  Psychiatric/Behavioral:  Negative for confusion and sleep disturbance.     Objective:  BP (!) 142/82   Pulse 100   Temp  98.2 F (36.8 C)   Ht 5' 8 (1.727 m)   Wt 172 lb 3.2 oz (78.1 kg)   LMP  (LMP Unknown)   SpO2 98%   BMI 26.18 kg/m   BP Readings from Last 3 Encounters:  03/06/24 (!) 142/82  09/05/23 116/70  03/06/23 120/80    Wt Readings from Last 3 Encounters:  03/06/24 172 lb 3.2 oz (78.1 kg)  09/05/23 170 lb (77.1 kg)  03/06/23 175 lb (79.4 kg)    Physical Exam Constitutional:      General: She is not in acute distress.    Appearance: She is well-developed.  HENT:     Head: Normocephalic.     Right Ear: External ear normal.     Left Ear: External ear normal.     Nose: Nose normal.  Eyes:     General:        Right eye:  No discharge.        Left eye: No discharge.     Conjunctiva/sclera: Conjunctivae normal.     Pupils: Pupils are equal, round, and reactive to light.  Neck:     Thyroid : No thyromegaly.     Vascular: No JVD.     Trachea: No tracheal deviation.  Cardiovascular:     Rate and Rhythm: Normal rate and regular rhythm.     Heart sounds: Normal heart sounds.  Pulmonary:     Effort: No respiratory distress.     Breath sounds: No stridor. No wheezing.  Abdominal:     General: Bowel sounds are normal. There is no distension.     Palpations: Abdomen is soft. There is no mass.     Tenderness: There is no abdominal tenderness. There is no guarding or rebound.  Musculoskeletal:        General: No tenderness.     Cervical back: Normal range of motion and neck supple. No rigidity.  Lymphadenopathy:     Cervical: No cervical adenopathy.  Skin:    Findings: No erythema or rash.  Neurological:     Cranial Nerves: No cranial nerve deficit.     Motor: No abnormal muscle tone.     Coordination: Coordination normal.     Deep Tendon Reflexes: Reflexes normal.  Psychiatric:        Behavior: Behavior normal.        Thought Content: Thought content normal.        Judgment: Judgment normal.   Melanonychia B  Lab Results  Component Value Date   WBC 4.8 09/05/2023   HGB 12.8 09/05/2023   HCT 38.8 09/05/2023   PLT 391.0 09/05/2023   GLUCOSE 121 (H) 09/05/2023   CHOL 211 (H) 06/06/2022   TRIG 119.0 06/06/2022   HDL 59.50 06/06/2022   LDLDIRECT 122.0 03/03/2008   LDLCALC 127 (H) 06/06/2022   ALT 28 09/05/2023   AST 25 09/05/2023   NA 140 09/05/2023   K 3.8 09/05/2023   CL 103 09/05/2023   CREATININE 0.64 09/05/2023   BUN 15 09/05/2023   CO2 27 09/05/2023   TSH 1.99 09/05/2023   HGBA1C 7.5 (H) 09/05/2023   MICROALBUR 0.4 12/07/2009    CT CARDIAC SCORING (SELF PAY ONLY) Addendum Date: 04/02/2021 ADDENDUM REPORT: 04/02/2021 09:27 CLINICAL DATA:  Cardiovascular Disease Risk stratification  EXAM: Coronary Calcium Score TECHNIQUE: A gated, non-contrast computed tomography scan of the heart was performed using 3mm slice thickness. Axial images were analyzed on a dedicated workstation. Calcium scoring of the coronary arteries was performed using the  Agatston method. FINDINGS: Coronary Calcium Score: Left main: 0 Left anterior descending artery: 0 Left circumflex artery: 0 Right coronary artery: 0 Total: 0 Percentile: 0 Pericardium: Normal. Ascending Aorta: Normal caliber.  Mild aortic atherosclerosis Non-cardiac: See separate report from [facility]. IMPRESSION: Coronary calcium score of 0. This was 0 percentile for age-, race-, and sex-matched controls. Mild aortic atherosclerosis. RECOMMENDATIONS: Coronary artery calcium (CAC) score is a strong predictor of incident coronary heart disease (CHD) and provides predictive information beyond traditional risk factors. CAC scoring is reasonable to use in the decision to withhold, postpone, or initiate statin therapy in intermediate-risk or selected borderline-risk asymptomatic adults (age 28-75 years and LDL-C >=70 to <190 mg/dL) who do not have diabetes or established atherosclerotic cardiovascular disease (ASCVD).* In intermediate-risk (10-year ASCVD risk >=7.5% to <20%) adults or selected borderline-risk (10-year ASCVD risk >=5% to <7.5%) adults in whom a CAC score is measured for the purpose of making a treatment decision the following recommendations have been made: If CAC=0, it is reasonable to withhold statin therapy and reassess in 5 to 10 years, as long as higher risk conditions are absent (diabetes mellitus, family history of premature CHD in first degree relatives (males <55 years; females <65 years), cigarette smoking, or LDL >=190 mg/dL). If CAC is 1 to 99, it is reasonable to initiate statin therapy for patients >=23 years of age. If CAC is >=100 or >=75th percentile, it is reasonable to initiate statin therapy at any age. Cardiology  referral should be considered for patients with CAC scores >=400 or >=75th percentile. *2018 AHA/ACC/AACVPR/AAPA/ABC/ACPM/ADA/AGS/APhA/ASPC/NLA/PCNA Guideline on the Management of Blood Cholesterol: A Report of the American College of Cardiology/American Heart Association Task Force on Clinical Practice Guidelines. J Am Coll Cardiol. 2019;73(24):3168-3209. Redell Shallow, MD Electronically Signed   By: Redell Shallow M.D.   On: 04/02/2021 09:27   Result Date: 04/02/2021 EXAM: OVER-READ INTERPRETATION  CT CHEST The following report is an over-read performed by radiologist Dr. Toribio Aye of [facility] on 04/02/2021. This over-read does not include interpretation of cardiac or coronary anatomy or pathology. The coronary calcium score interpretation by the cardiologist is attached. COMPARISON:  None. FINDINGS: Atherosclerotic calcifications in the proximal ascending thoracic aorta. Within the visualized portions of the thorax there are no suspicious appearing pulmonary nodules or masses, there is no acute consolidative airspace disease, no pleural effusions, no pneumothorax and no lymphadenopathy. Visualized portions of the upper abdomen are unremarkable. There are no aggressive appearing lytic or blastic lesions noted in the visualized portions of the skeleton. IMPRESSION: 1.  Aortic Atherosclerosis (ICD10-I70.0). Electronically Signed: By: Toribio Aye M.D. On: 04/02/2021 07:58    Assessment & Plan:   Problem List Items Addressed This Visit     Asthma   Advair is not covered - d/c On Wixela       Relevant Medications   montelukast  (SINGULAIR ) 10 MG tablet   DM2 (diabetes mellitus, type 2) (HCC) - Primary   Cont on Actos , Parlodel , Metformin       Relevant Orders   Comprehensive metabolic panel with GFR   Hemoglobin A1c   Essential hypertension   CT coronary calcium score is 0.  Aortic atherosclerosis, mild. Chronic On HCTZ      LOW BACK PAIN     LBP - had another  surgery by Dr Alm Molt. Doing better      Relevant Medications   ibuprofen  (ADVIL ) 600 MG tablet   Melanonychia   Melanonychia B >12 mo Derm ref offered  Rash   Rash on B legs/feet/arms/back  Allergy tests (-) Derm f/u Triamc cream Singulair  and Levo      Vitamin B12 deficiency   On B12         Meds ordered this encounter  Medications   glucose blood (ONETOUCH VERIO) test strip    Sig: And lancets 1/day    Dispense:  100 each    Refill:  3   ibuprofen  (ADVIL ) 600 MG tablet    Sig: Take 1 tablet (600 mg total) by mouth every 6 (six) hours as needed.    Dispense:  90 tablet    Refill:  2   levocetirizine (XYZAL ) 5 MG tablet    Sig: Take 1 tablet (5 mg total) by mouth every evening.    Dispense:  90 tablet    Refill:  2   gabapentin  (NEURONTIN ) 300 MG capsule    Sig: Take 1 capsule (300 mg total) by mouth 3 (three) times daily.    Dispense:  270 capsule    Refill:  1   montelukast  (SINGULAIR ) 10 MG tablet    Sig: Take 1 tablet (10 mg total) by mouth at bedtime.    Dispense:  90 tablet    Refill:  3   triamcinolone  cream (KENALOG ) 0.1 %    Sig: Apply 1 Application topically 2 (two) times daily.    Dispense:  160 g    Refill:  3      Follow-up: Return in about 6 months (around 09/04/2024) for Wellness Exam.  Marolyn Noel, MD

## 2024-03-06 NOTE — Assessment & Plan Note (Signed)
 Advair is not covered - d/c On Wixela

## 2024-03-06 NOTE — Assessment & Plan Note (Signed)
 Melanonychia B >12 mo Derm ref offered

## 2024-03-06 NOTE — Assessment & Plan Note (Signed)
Cont on Actos, Parlodel, Metformin °

## 2024-03-06 NOTE — Assessment & Plan Note (Signed)
 Rash on B legs/feet/arms/back  Allergy tests (-) Derm f/u Triamc cream Singulair  and Levo

## 2024-03-06 NOTE — Assessment & Plan Note (Signed)
 CT coronary calcium score is 0.  Aortic atherosclerosis, mild. Chronic On HCTZ

## 2024-03-06 NOTE — Assessment & Plan Note (Signed)
 LBP - had another surgery by Dr Waymond Hailey. Doing better

## 2024-03-06 NOTE — Assessment & Plan Note (Signed)
 On B12

## 2024-03-07 ENCOUNTER — Ambulatory Visit: Payer: Self-pay | Admitting: Internal Medicine

## 2024-04-22 ENCOUNTER — Other Ambulatory Visit: Payer: Self-pay | Admitting: Internal Medicine

## 2024-09-04 ENCOUNTER — Ambulatory Visit: Admitting: Internal Medicine
